# Patient Record
Sex: Female | Born: 1970 | Race: White | Hispanic: No | Marital: Married | Smoking: Former smoker
Health system: Southern US, Community
[De-identification: ages and names within clinical notes are randomized; demographics above are authoritative.]

## PROBLEM LIST (undated history)

## (undated) DIAGNOSIS — H9319 Tinnitus, unspecified ear: Secondary | ICD-10-CM

## (undated) DIAGNOSIS — R011 Cardiac murmur, unspecified: Secondary | ICD-10-CM

## (undated) DIAGNOSIS — D649 Anemia, unspecified: Secondary | ICD-10-CM

## (undated) DIAGNOSIS — N920 Excessive and frequent menstruation with regular cycle: Secondary | ICD-10-CM

## (undated) DIAGNOSIS — Z8051 Family history of malignant neoplasm of kidney: Secondary | ICD-10-CM

## (undated) DIAGNOSIS — N6001 Solitary cyst of right breast: Secondary | ICD-10-CM

## (undated) DIAGNOSIS — Z806 Family history of leukemia: Secondary | ICD-10-CM

## (undated) DIAGNOSIS — K2100 Gastro-esophageal reflux disease with esophagitis, without bleeding: Secondary | ICD-10-CM

## (undated) DIAGNOSIS — I10 Essential (primary) hypertension: Secondary | ICD-10-CM

## (undated) DIAGNOSIS — K449 Diaphragmatic hernia without obstruction or gangrene: Secondary | ICD-10-CM

## (undated) DIAGNOSIS — C801 Malignant (primary) neoplasm, unspecified: Secondary | ICD-10-CM

## (undated) DIAGNOSIS — K648 Other hemorrhoids: Secondary | ICD-10-CM

## (undated) DIAGNOSIS — K8020 Calculus of gallbladder without cholecystitis without obstruction: Secondary | ICD-10-CM

## (undated) DIAGNOSIS — Z800 Family history of malignant neoplasm of digestive organs: Secondary | ICD-10-CM

## (undated) DIAGNOSIS — K824 Cholesterolosis of gallbladder: Secondary | ICD-10-CM

## (undated) DIAGNOSIS — Z803 Family history of malignant neoplasm of breast: Secondary | ICD-10-CM

## (undated) DIAGNOSIS — K760 Fatty (change of) liver, not elsewhere classified: Secondary | ICD-10-CM

## (undated) DIAGNOSIS — K219 Gastro-esophageal reflux disease without esophagitis: Secondary | ICD-10-CM

## (undated) DIAGNOSIS — T7840XA Allergy, unspecified, initial encounter: Secondary | ICD-10-CM

## (undated) DIAGNOSIS — Z8041 Family history of malignant neoplasm of ovary: Secondary | ICD-10-CM

## (undated) HISTORY — DX: Family history of malignant neoplasm of kidney: Z80.51

## (undated) HISTORY — DX: Essential (primary) hypertension: I10

## (undated) HISTORY — DX: Gastro-esophageal reflux disease with esophagitis, without bleeding: K21.00

## (undated) HISTORY — DX: Solitary cyst of right breast: N60.01

## (undated) HISTORY — DX: Cardiac murmur, unspecified: R01.1

## (undated) HISTORY — DX: Diaphragmatic hernia without obstruction or gangrene: K44.9

## (undated) HISTORY — DX: Fatty (change of) liver, not elsewhere classified: K76.0

## (undated) HISTORY — DX: Calculus of gallbladder without cholecystitis without obstruction: K80.20

## (undated) HISTORY — DX: Family history of malignant neoplasm of ovary: Z80.41

## (undated) HISTORY — DX: Allergy, unspecified, initial encounter: T78.40XA

## (undated) HISTORY — DX: Other hemorrhoids: K64.8

## (undated) HISTORY — DX: Anemia, unspecified: D64.9

## (undated) HISTORY — DX: Gastro-esophageal reflux disease without esophagitis: K21.9

## (undated) HISTORY — PX: LEEP: SHX91

## (undated) HISTORY — DX: Excessive and frequent menstruation with regular cycle: N92.0

## (undated) HISTORY — DX: Family history of malignant neoplasm of breast: Z80.3

## (undated) HISTORY — DX: Cholesterolosis of gallbladder: K82.4

## (undated) HISTORY — DX: Tinnitus, unspecified ear: H93.19

## (undated) HISTORY — DX: Malignant (primary) neoplasm, unspecified: C80.1

## (undated) HISTORY — DX: Family history of malignant neoplasm of digestive organs: Z80.0

## (undated) HISTORY — DX: Family history of leukemia: Z80.6

## (undated) HISTORY — PX: REDUCTION MAMMAPLASTY: SUR839

---

## 2011-07-10 HISTORY — PX: COLONOSCOPY: SHX174

## 2013-03-27 HISTORY — PX: ESOPHAGOGASTRODUODENOSCOPY: SHX1529

## 2014-06-19 DIAGNOSIS — N6001 Solitary cyst of right breast: Secondary | ICD-10-CM

## 2014-06-19 HISTORY — DX: Solitary cyst of right breast: N60.01

## 2015-07-02 ENCOUNTER — Other Ambulatory Visit: Payer: Self-pay | Admitting: Family Medicine

## 2015-07-02 ENCOUNTER — Telehealth: Payer: Self-pay | Admitting: Family Medicine

## 2015-07-02 ENCOUNTER — Encounter: Payer: Self-pay | Admitting: Family Medicine

## 2015-07-02 ENCOUNTER — Ambulatory Visit (INDEPENDENT_AMBULATORY_CARE_PROVIDER_SITE_OTHER): Payer: 59 | Admitting: Family Medicine

## 2015-07-02 VITALS — BP 128/89 | HR 61 | Temp 98.4°F | Resp 20 | Ht 62.0 in | Wt 192.8 lb

## 2015-07-02 DIAGNOSIS — Z1239 Encounter for other screening for malignant neoplasm of breast: Secondary | ICD-10-CM | POA: Diagnosis not present

## 2015-07-02 DIAGNOSIS — Z7189 Other specified counseling: Secondary | ICD-10-CM | POA: Diagnosis not present

## 2015-07-02 DIAGNOSIS — E669 Obesity, unspecified: Secondary | ICD-10-CM | POA: Insufficient documentation

## 2015-07-02 DIAGNOSIS — K219 Gastro-esophageal reflux disease without esophagitis: Secondary | ICD-10-CM

## 2015-07-02 DIAGNOSIS — J302 Other seasonal allergic rhinitis: Secondary | ICD-10-CM

## 2015-07-02 DIAGNOSIS — Z7689 Persons encountering health services in other specified circumstances: Secondary | ICD-10-CM

## 2015-07-02 DIAGNOSIS — Z79899 Other long term (current) drug therapy: Secondary | ICD-10-CM | POA: Insufficient documentation

## 2015-07-02 DIAGNOSIS — M25552 Pain in left hip: Secondary | ICD-10-CM | POA: Insufficient documentation

## 2015-07-02 DIAGNOSIS — Z1231 Encounter for screening mammogram for malignant neoplasm of breast: Secondary | ICD-10-CM

## 2015-07-02 DIAGNOSIS — Z6835 Body mass index (BMI) 35.0-35.9, adult: Secondary | ICD-10-CM | POA: Diagnosis not present

## 2015-07-02 DIAGNOSIS — Z0000 Encounter for general adult medical examination without abnormal findings: Secondary | ICD-10-CM

## 2015-07-02 MED ORDER — FEXOFENADINE HCL 180 MG PO TABS
180.0000 mg | ORAL_TABLET | Freq: Every day | ORAL | Status: DC
Start: 2015-07-02 — End: 2016-09-16

## 2015-07-02 NOTE — Patient Instructions (Signed)
It was a pleasure meeting you today. We will go through your records and call you on when to schedule your yearly phyisical. We can complete your PAP at that time if indicated. I will also order your mammogram today.   I have called in [place] for you to try in place of zyrtec.

## 2015-07-02 NOTE — Telephone Encounter (Signed)
Please call patient: - After review of all of her records, it appeared that she is due for her yearly physical in August. We will complete her labs at that time as well. - Her mammogram was ordered at her office visit yesterday, and she should be receiving a call to set this up for her. - Below are the list of her due dates for her health maintenance.  Health Maintenance  Topic Date Due  . MAMMOGRAM  06/19/2015  . INFLUENZA VACCINE  09/25/2015  . COLONOSCOPY  07/09/2016  . PAP SMEAR  06/12/2017  . TETANUS/TDAP  06/13/2024  . HIV Screening  Completed

## 2015-07-02 NOTE — Progress Notes (Signed)
Patient ID: Rebecca Morrison, female   DOB: 1970-11-07, 45 y.o.   MRN: [id number]      Patient ID: Rebecca Morrison, female  DOB: 1970-09-17, 45 y.o.   MRN: [id number]  Subjective:  Rebecca Morrison is a 45 y.o. female present for establishment of care. She just moved from [place] 10/2014.  All past medical history, surgical history, allergies, family history, immunizations, medications and social history were obtained/entered in the electronic medical record today. All recent labs, ED visits and hospitalizations within the last year were reviewed. Patient has significant medical history of GERD, menorrhagia, iron deficiency anemia.  GERD: She is currently taking omeprazole, which she states occasionally makes her dizzy so she takes every other day. She has been tried on Carafate in the past, and feels that worked the best for her. She had also been on dexilant. She does admit she does not take her medications as indicated frequently.  BMI 35.0-35.9: Patient states she is struggling with her weight, she has been on phentermine in the past. She is not exercising routinely. She states she has a left hip pain that has been occurring over the last few weeks to months and it is hindering and exercise regimen. 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 She feels this is also hindering her exercise routine, she does not know many people to exercise with. She doesn't to do plenty of housework, and painting etc. which keeps her active but not cardiovascular active. She does not like to work out cardiovascularly. She does enjoy lifting weights. He has more fatigue. She does admit she has benign deficient anemia, and does not take iron supplementation because it makes her stomach upset.  Seasonal allergies: Patient states she was warned when she moved [place] should have more allergies. She is currently suffering from increased allergies and likely had a sinus infection a few weeks ago. She is  currently taking an antihistamine, however she does not feel her symptoms are improving.  Left hip joint pain: Patient states a few months ago she noticed a sharp pain in her left hip joint when walking. She says this is intermittent and she has been unable to identify certain movements it re-creates the problem. She's never had a hip injury. She reports she is worried to take certain steps as the pain is sharp and quick.   Health maintenance:  Colonoscopy: Family history of colon cancer, personal history of rectal bleeding. Colonoscopy completed May 2013. Follow-up at that time was encouraged in 5 years. Patient's colonoscopy was normal at that time. Mammogram: The patient has a family history of breast cancer. She has a personal history of cyst her right breast. Her last mammogram was completed April 2016. Cervical cancer screening: She has not found gynecologist. She had 1 abnormal pap, 1 leep that was normal. 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 Every 3 year follow up now.  Immunizations: Tetanus completed 06/14/2014, she has declined flu immunizations in the past. MRI completed in 2004. Infectious disease screening: HIV completed with last pregnancy. DEXA: History of osteoporosis in her mother, would favor early screening found to be vitamin D deficient or early menopause. Family has found a dentist. She has yet to make an appointment for herself.  Health Maintenance  Topic Date Due  . MAMMOGRAM  06/19/2015  . INFLUENZA VACCINE  09/25/2015  . COLONOSCOPY  07/09/2016  . PAP SMEAR  06/12/2017  . TETANUS/TDAP  06/13/2024  . HIV Screening  Completed  Immunization History  Administered Date(s) Administered  . MMR 09/29/2002  . Tdap 06/14/2014    Past Medical History  Diagnosis Date  . Allergy   . Anemia     pt states iron deficient  . GERD (gastroesophageal reflux disease)   . Hiatal hernia     3/1//2016 Dr. Gala Lewandowsky  . Tinnitus     ENT evaluated  . Menorrhagia   . Cyst of right  breast 06/19/2014    US/mamm, confirmed cyst 36m   No Known Allergies Past Surgical History  Procedure Laterality Date  . Colonoscopy  07/10/2011    rectal bleeding, FHX. Normal colonoscopy.   . Esophagogastroduodenoscopy  03/27/2013    hiatal hernia/GERD   Family History  Problem Relation Age of Onset  . Arthritis Mother   . Cancer Sister   . Kidney cancer Sister   . Colon cancer Maternal Grandmother   . Ovarian cancer Maternal Grandmother   . Bone cancer Maternal Grandfather   . Heart disease Maternal Grandfather   . Breast cancer Paternal Grandmother   . Osteoporosis Mother     "severe"   Social History   Social History  . Marital Status: Married    Spouse Name: N/A  . Number of Children: N/A  . Years of Education: N/A   Occupational History  . Not on file.   Social History Main Topics  . Smoking status: Former SResearch scientist (life sciences) . Smokeless tobacco: Never Used  . Alcohol Use: 3.0 - 3.6 oz/week    5-6 Glasses of wine per week  . Drug Use: No  . Sexual Activity: Yes    Birth Control/ Protection: Surgical     Comment: partner-vasectomy   Other Topics Concern  . 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   Former smoker, quit 2003.   Social alcohol use, no drug use.   Drinks caffeinated beverages, uses herbal remedies, takes a daily vitamin.   Wears her seatbelt, wears a bicycle helmet, exercises at least 3 times a week.   Smoke detector in the home, feels safe in her relationships.    ROS: Negative, with the exception of above mentioned in HPI  Objective: BP 128/89 mmHg  Pulse 61  Temp(Src) 98.4 F (36.9 C)  Resp 20  Ht _0  (1.575 m)  Wt 192 lb 12 oz (87.431 kg)  BMI 35.25 kg/m2  SpO2 98%  LMP 07/02/2015 Gen: Afebrile. No acute distress. Nontoxic in appearance, well-developed, well-nourished, female, Pleasant HENT: AT. [place].   MMM, no oral lesions Eyes:Pupils Equal Round Reactive to light, Extraocular movements intact,  Conjunctiva without redness, discharge or icterus. Neck/lymp/endocrine: Supple, no lymphadenopathy CV: RRR, 1/6 systolic murmur appreciated. No edema. Chest: CTAB, no wheeze, rhonchi or crackles.  Abd: Soft. Obese. NTND. BS present.  Skin: No rashes, purpura or petechiae. Warm and well-perfused. Skin intact. Neuro/Msk:  Normal gait. PERLA. EOMi. Alert. Oriented x3.  Cranial nerves II through XII intact. Muscle strength 5/5 upper and lower extremity. DTRs equal bilaterally. Psych: Normal affect, dress and demeanor. Normal speech. Normal thought content and judgment.   Assessment/plan: ELaasya Peytonis a 45y.o. female present for establishment of care, with multiple complaints. 1. Seasonal allergies - Discussed seasonal allergies with patient today, she was encouraged to change the medication she has chosen for antihistamine. Recommended Allegra, this is called in for her today. -She is to use  Flonase, and attempt Mucinex use to help with secretions. - fexofenadine (ALLEGRA ALLERGY) 180 MG tablet; Take 1 tablet (180 mg total) by mouth daily.  Dispense: 30 tablet; Refill: 5 - If not improving on the above regimen patient will need to follow-up to discuss in more detail and consider additional prescribed medications such as nasal sprays or Singulair.  2. Breast cancer screening - Order mammogram for patient today. Her mammogram studies are scanned into the electronic medical record from prior PCP/studies in [place].  3. BMI 35.0-35.9,adult - Patient was concerned about her weight gain. Discussed with her we would need to make an appointment to discuss this in more detail. I would not recommend medication regimens for weight loss. Encouraged her to increase her exercise greater than 150 minutes a week with cardiovascular workout, and monitor her caloric intake. - Patient is having some mild left pain that  seems to be preventing her from exercising more routinely. Discussed with her using anti-inflammatories and nonweightbearing exercises such as water aerobics. If pain is worsening or does not improve within encourage further workup.  4. Health care maintenance Health Maintenance  Topic Date Due  . MAMMOGRAM  06/19/2015  . INFLUENZA VACCINE  09/25/2015  . COLONOSCOPY  07/09/2016  . PAP SMEAR  06/12/2017  . TETANUS/TDAP  06/13/2024  . HIV Screening  Completed    5. Gastroesophageal reflux disease, esophagitis presence not specified - Patient to continue omeprazole. Consider that small daily cough could be from her reflux worsening. Although she seems to have other allergy symptoms. Will treat allergies first if no improvement in cough would change omeprazole.  6. Left hip pain - Patient encouraged to make an appointment to have this fully evaluated if NSAID use is not effective. There was no injury. Consider possible small labral tear.  CPE with labs August 2017  Greater than 45 minutes was spent with patient, greater than 50% of that time was spent face-to-face with patient counseling and coordinating care.  Electronically signed by: Howard Pouch, DO [place]

## 2015-07-03 NOTE — Telephone Encounter (Signed)
Spoke with patient reviewed information. Patient will call back to schedule CPE.

## 2015-07-26 ENCOUNTER — Ambulatory Visit: Payer: 59

## 2015-07-27 ENCOUNTER — Ambulatory Visit
Admission: RE | Admit: 2015-07-27 | Discharge: 2015-07-27 | Disposition: A | Discharge status: Home / Self Care | Payer: 59 | Source: Ambulatory Visit | Attending: Family Medicine | Admitting: Family Medicine

## 2015-07-27 DIAGNOSIS — Z1231 Encounter for screening mammogram for malignant neoplasm of breast: Secondary | ICD-10-CM

## 2015-09-03 ENCOUNTER — Ambulatory Visit: Payer: 59 | Admitting: Family Medicine

## 2015-10-25 ENCOUNTER — Ambulatory Visit: Payer: 59 | Admitting: Family Medicine

## 2015-10-30 ENCOUNTER — Encounter: Payer: Self-pay | Admitting: Family Medicine

## 2015-10-30 ENCOUNTER — Ambulatory Visit (INDEPENDENT_AMBULATORY_CARE_PROVIDER_SITE_OTHER): Payer: 59 | Admitting: Family Medicine

## 2015-10-30 ENCOUNTER — Telehealth: Payer: Self-pay | Admitting: Family Medicine

## 2015-10-30 VITALS — BP 118/83 | HR 63 | Temp 98.6°F | Resp 20 | Ht 62.0 in | Wt 197.0 lb

## 2015-10-30 DIAGNOSIS — F418 Other specified anxiety disorders: Secondary | ICD-10-CM | POA: Diagnosis not present

## 2015-10-30 DIAGNOSIS — Z6835 Body mass index (BMI) 35.0-35.9, adult: Secondary | ICD-10-CM

## 2015-10-30 DIAGNOSIS — R600 Localized edema: Secondary | ICD-10-CM | POA: Diagnosis not present

## 2015-10-30 LAB — COMPREHENSIVE METABOLIC PANEL
ALT: 28 U/L (ref 0–35)
AST: 22 U/L (ref 0–37)
Albumin: 4.1 g/dL (ref 3.5–5.2)
Alkaline Phosphatase: 65 U/L (ref 39–117)
BILIRUBIN TOTAL: 0.6 mg/dL (ref 0.2–1.2)
BUN: 7 mg/dL (ref 6–23)
CO2: 24 meq/L (ref 19–32)
CREATININE: 0.71 mg/dL (ref 0.40–1.20)
Calcium: 8.9 mg/dL (ref 8.4–10.5)
Chloride: 104 mEq/L (ref 96–112)
GFR: 94.6 mL/min (ref >60.00)
GLUCOSE: 88 mg/dL (ref 70–99)
Potassium: 4.2 mEq/L (ref 3.5–5.1)
Sodium: 136 mEq/L (ref 135–145)
Total Protein: 6.9 g/dL (ref 6.0–8.3)

## 2015-10-30 MED ORDER — VENLAFAXINE HCL ER 37.5 MG PO CP24: 37.5000 mg | ORAL_CAPSULE | Freq: Every day | ORAL | 0 refills | Status: DC

## 2015-10-30 MED ORDER — VENLAFAXINE HCL ER 75 MG PO CP24: 75.0000 mg | ORAL_CAPSULE | Freq: Every day | ORAL | 0 refills | Status: DC

## 2015-10-30 NOTE — Progress Notes (Signed)
Patient ID: Rebecca Morrison, female   DOB: 1970-08-24, 45 y.o.   MRN: [id number]      Patient ID: Rebecca Morrison, female  DOB: [birth year], 45 y.o.   MRN: [id number]  Subjective:  Rebecca Morrison is a 45 y.o. female present for acute complaints.   Anxiousness: Patient states her father passed away 04-29-2022. She has recently moved to the area within the last year and a half. She feels she has increased anxiety, she's had weight gain and she has noticed lower extremity edema on 2 occasions. She states that she had bilateral lower extremity after a weekend of increased alcohol consumption and a trip from [place]. She denies any chest pain, shortness of breath or daily lower extremity edema. She states the date that it occurred she had had a few martinis. She is wondering if she is increasing her alcohol consumption secondary to her increase in anxiousness. She reports she's not feeling dictated, but she is consuming alcohol daily, usually 1 beer with dinner.  Have you ever felt you needed to Cut down on your drinking? Yes, recently Have people Annoyed you by criticizing your drinking? no Have you ever felt Guilty about drinking? no Have you ever felt you needed a drink first thing in the morning (Eye-opener) to steady your nerves or to get rid of a hangover? no  BMI 35.0-35.9: Patient states she is struggling with her weight, she has been on phentermine in the past. She is not exercising at all, and admits to sedentary lifestyle.  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 She feels this is also hindering her exercise routine, she does not know many people to exercise with. She does not like to work out cardiovascularly. She does enjoy lifting weights.  Mood disorder screen: negative  Depression screen [facility] 2/9 10/30/2015 07/02/2015  Decreased Interest 1 0  Down, Depressed, Hopeless 2 0  PHQ - 2 Score 3 0  Altered sleeping 1 -  Tired, decreased energy 3 -  Change in appetite 3  -  Feeling bad or failure about yourself  2 -  Trouble concentrating 1 -  Moving slowly or fidgety/restless 1 -  Suicidal thoughts 0 -  PHQ-9 Score 14 -   GAD 7 : Generalized Anxiety Score 10/30/2015  Nervous, Anxious, on Edge 1  Control/stop worrying 3  Worry too much - different things 3  Trouble relaxing 3  Restless 0  Easily annoyed or irritable 1  Afraid - awful might happen 3  Total GAD 7 Score 14  Anxiety Difficulty Somewhat difficult    Health Maintenance  Topic Date Due  . INFLUENZA VACCINE  09/25/2015  . COLONOSCOPY  07/09/2016  . MAMMOGRAM  07/26/2016  . PAP SMEAR  06/12/2017  . TETANUS/TDAP  06/13/2024  . HIV Screening  Completed    Immunization History  Administered Date(s) Administered  . MMR 09/29/2002  . Tdap 06/14/2014    Past Medical History:  Diagnosis Date  . Allergy   . Anemia    pt states iron deficient  . Cyst of right breast 06/19/2014   US/mamm, confirmed cyst 59m  . GERD (gastroesophageal reflux disease)   . Hiatal hernia    3/1//2016 Dr. LGala Lewandowsky . Menorrhagia   . Murmur    Patient states echocardiogram was normal  . Tinnitus    ENT evaluated   No Known Allergies Past Surgical History:  Procedure Laterality Date  . COLONOSCOPY  07/10/2011   rectal bleeding, FHX. Normal colonoscopy.   .Marland Kitchen  ESOPHAGOGASTRODUODENOSCOPY  03/27/2013   hiatal hernia/GERD   Family History  Problem Relation Age of Onset  . Arthritis Mother   . Osteoporosis Mother     "severe"  . Cancer Sister   . Kidney cancer Sister   . Colon cancer Maternal Grandmother   . Ovarian cancer Maternal Grandmother   . Bone cancer Maternal Grandfather   . Heart disease Maternal Grandfather   . Breast cancer Paternal Grandmother    Social History   Social History  . Marital status: Married    Spouse name: N/A  . Number of children: N/A  . Years of education: N/A   Occupational History  . Not on file.   Social History Main Topics  . Smoking status: Former Research scientist (life sciences)    . Smokeless tobacco: Never Used  . Alcohol use 3.0 - 3.6 oz/week    5 - 6 Glasses of wine per week  . Drug use: No  . Sexual activity: Yes    Birth control/ protection: Surgical     Comment: partner-vasectomy   Other Topics Concern  . Not on file   Social History Narrative   Married, husband [name]. 2 children [name] in [place].   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   Former smoker, quit 2003.   Social alcohol use, no drug use.   Drinks caffeinated beverages, uses herbal remedies, takes a daily vitamin.   Wears her seatbelt, wears a bicycle helmet, exercises at least 3 times a week.   Smoke detector in the home, feels safe in her relationships.    ROS: Negative, with the exception of above mentioned in HPI  Objective: BP 118/83 (BP Location: Left Arm, Patient Position: Sitting, Cuff Size: Large)   Pulse 63   Temp 98.6 F (37 C)   Resp 20   Ht _0  (1.575 m)   Wt 197 lb (89.4 kg)   SpO2 96%   BMI 36.03 kg/m  Gen: Afebrile. No acute distress. Nontoxic in appearance, well-developed, well-nourished, female, Pleasant HENT: AT. [place].  MMM, no oral lesions Eyes:Pupils Equal Round Reactive to light, Extraocular movements intact,  Conjunctiva without redness, discharge or icterus. CV: RRR, 1/6 systolic murmur appreciated. No edema. Chest: CTAB, no wheeze, rhonchi or crackles.  Abd: Soft. Obese. NTND. BS present.  Skin: No rashes, purpura or petechiae. Warm and well-perfused. Skin intact. Neuro/Msk:  Normal gait. PERLA. EOMi. Alert. Oriented x3.   Psych: Anxious.. Normal speech. Normal thought content and judgment.   Assessment/plan: Rebecca Morrison is a 45 y.o. female present for establishment of care, with multiple complaints.  Bilateral lower extremity edema - This sounds like dependent edema, with flares when high sodium meals and long car rides occur. This is not a daily occurrence without any other systemic symptoms. Reassured  patient. - Comp Met (CMET): Rule out increased liver enzymes her decreased kidney function. - Discussed the benefits of exercise and weight loss. - Follow-up in 4 weeks  Depression with anxiety - Effexor 37.5 mg for 1 week, then taper to 75 mg daily.  - Patient follow-up in 4 weeks.  BMI 35.0-35.9,adult - Again, I would not recommend medication regimens for weight loss. Encouraged her to increase her exercise greater than 150 minutes a week with cardiovascular workout, and monitor her caloric intake. - PREP program exercise/nutrition program contact information provided to patient today.   Flu shot declined.   > 25 minutes spent with patient, >50% of time spent face to face counseling patient and coordinating  care.]  Electronically signed by: Howard Pouch, DO [place]

## 2015-10-30 NOTE — Telephone Encounter (Signed)
Please call patient: Her liver and kidney function are normal.

## 2015-10-30 NOTE — Patient Instructions (Signed)
Start effexor 37.7 mg for 1 week then increase to 75 mg dose.  Followup in 4 weeks.  Try PREP program   CMP to check liver/kidneys today. We will call you with results.

## 2015-10-31 MED ORDER — FLUTICASONE PROPIONATE 50 MCG/ACT NA SUSP: NASAL | 2 refills | Status: DC

## 2015-10-31 NOTE — Telephone Encounter (Signed)
Left message with results on patient voice mail 

## 2015-11-09 ENCOUNTER — Other Ambulatory Visit: Payer: Self-pay | Admitting: *Deleted

## 2015-11-09 MED ORDER — VENLAFAXINE HCL ER 75 MG PO CP24: 75.0000 mg | ORAL_CAPSULE | Freq: Every day | ORAL | 0 refills | Status: DC

## 2015-11-09 NOTE — Telephone Encounter (Signed)
Sent Rx for venlafaxine 75 mg to pharmacy patient doesn't know where printed script is.

## 2015-12-24 ENCOUNTER — Other Ambulatory Visit: Payer: Self-pay | Admitting: *Deleted

## 2015-12-24 MED ORDER — FLUTICASONE PROPIONATE 50 MCG/ACT NA SUSP: NASAL | 1 refills | Status: DC

## 2016-01-02 ENCOUNTER — Telehealth: Payer: Self-pay | Admitting: *Deleted

## 2016-01-02 MED ORDER — VENLAFAXINE HCL ER 75 MG PO CP24: 75.0000 mg | ORAL_CAPSULE | Freq: Every day | ORAL | 0 refills | Status: DC

## 2016-01-02 NOTE — Telephone Encounter (Signed)
Received refill request for venlafaxine. Patient was supposed to follow up in weeks after her appt  On 10/30/15. Left message for patient to call to schedule an appt for follow up. Sent a 30 day supply to pharmacy to allow patient time to schedule an appt.

## 2016-04-14 ENCOUNTER — Ambulatory Visit (INDEPENDENT_AMBULATORY_CARE_PROVIDER_SITE_OTHER): Payer: 59 | Admitting: Family Medicine

## 2016-04-14 ENCOUNTER — Encounter: Payer: Self-pay | Admitting: Family Medicine

## 2016-04-14 VITALS — BP 119/71 | HR 69 | Temp 98.4°F | Resp 20 | Wt 199.8 lb

## 2016-04-14 DIAGNOSIS — J0110 Acute frontal sinusitis, unspecified: Secondary | ICD-10-CM | POA: Diagnosis not present

## 2016-04-14 MED ORDER — AMOXICILLIN-POT CLAVULANATE 875-125 MG PO TABS: 1.0000 | ORAL_TABLET | Freq: Two times a day (BID) | ORAL | 0 refills | Status: DC

## 2016-04-14 NOTE — Progress Notes (Signed)
Evalena Clute , Jun 01, 1970, 46 y.o., female MRN: [id number] Patient Care Team    Relationship Specialty Notifications Start End  [name], DO PCP - General Family Medicine  09/03/15     CC: cough Subjective: Pt presents for an acute OV with complaints of cough, fatigue, headache, dizziness, sneezing of 2-3 week  duration.  She states she started omeprazole, but it is not better. She has been taking flonase daily. She has been outdoors, raking leaves and doing yardwork.  She denies fever, chills, nausea or vomit.   No Known Allergies Social History  Substance Use Topics  . Smoking status: Former Research scientist (life sciences)  . Smokeless tobacco: Never Used  . Alcohol use 3.0 - 3.6 oz/week    5 - 6 Glasses of wine per week   Past Medical History:  Diagnosis Date  . Allergy   . Anemia    pt states iron deficient  . Cyst of right breast 06/19/2014   US/mamm, confirmed cyst 34mm  . GERD (gastroesophageal reflux disease)   . Hiatal hernia    3/1//2016 Dr. Gala Lewandowsky  . Menorrhagia   . Murmur    Patient states echocardiogram was normal  . Tinnitus    ENT evaluated   Past Surgical History:  Procedure Laterality Date  . COLONOSCOPY  07/10/2011   rectal bleeding, FHX. Normal colonoscopy.   . ESOPHAGOGASTRODUODENOSCOPY  03/27/2013   hiatal hernia/GERD   Family History  Problem Relation Age of Onset  . Arthritis Mother   . Osteoporosis Mother     "severe"  . Cancer Sister   . Kidney cancer Sister   . Colon cancer Maternal Grandmother   . Ovarian cancer Maternal Grandmother   . Bone cancer Maternal Grandfather   . Heart disease Maternal Grandfather   . Breast cancer Paternal Grandmother    Allergies as of 04/14/2016   No Known Allergies     Medication List       Accurate as of 04/14/16  2:47 PM. Always use your most recent med list.          B-complex with vitamin C tablet Take 1 tablet by mouth daily.   BIOTIN 5000 5 MG Caps Generic drug:  Biotin Take 5,000 mg by mouth.     calcium-vitamin D 500-200 MG-UNIT tablet Take 1 tablet by mouth daily.   fexofenadine 180 MG tablet Commonly known as:  ALLEGRA ALLERGY Take 1 tablet (180 mg total) by mouth daily.   fluticasone 50 MCG/ACT nasal spray Commonly known as:  FLONASE IN THE NOSTRILS DAILY 2 SPRAYS IN EACH NOSTRIL DAILY   Ginkgo Biloba 40 MG Tabs Take 60 mg by mouth.   glucosamine-chondroitin 500-400 MG tablet Take 1 tablet by mouth 3 (three) times daily.   IRON-C PO Take 65 mg by mouth.   Melatonin 10 MG Tabs Take by mouth.   multivitamin with minerals tablet Take 1 tablet by mouth daily.   omeprazole 20 MG capsule Commonly known as:  PRILOSEC Take 20 mg by mouth daily.   venlafaxine XR 75 MG 24 hr capsule Commonly known as:  EFFEXOR XR Take 1 capsule (75 mg total) by mouth daily with breakfast. Needs office visit prior to anymore refills.       No results found for this or any previous visit (from the past 24 hour(s)). No results found.   ROS: Negative, with the exception of above mentioned in HPI   Objective:  BP 119/71 (BP Location: Right Arm, Patient Position: Sitting, Cuff Size:  Large)   Pulse 69   Temp 98.4 F (36.9 C)   Resp 20   Wt 199 lb 12 oz (90.6 kg)   SpO2 98%   BMI 36.53 kg/m  Body mass index is 36.53 kg/m. Gen: Afebrile. No acute distress. Nontoxic in appearance, well developed, well nourished.  HENT: AT. [place]. Bilateral TM visualized fullness. MMM, no oral lesions. Bilateral nares with erythema and drainage. Throat without erythema or exudates. Mild cough and TTP frontal sinus.  Eyes:Pupils Equal Round Reactive to light, Extraocular movements intact,  Conjunctiva without redness, discharge or icterus. Neck/lymp/endocrine: Supple,no lymphadenopathy CV: RRR Chest: CTAB, no wheeze or crackles. Good air movement, normal resp effort.  Abd: Soft. NTND. BS present Neuro:  Normal gait. PERLA. EOMi. Alert. Oriented x3   Assessment/Plan: Arabella Manocchio is a 46 y.o.  female present for acute OV for  1. Acute frontal sinusitis, recurrence not specified Rest, hydrate, nasal saline Restart flonase, antihistamine augmentin  F/U PRN   electronically signed by:  Howard Pouch, DO  [place]

## 2016-04-14 NOTE — Patient Instructions (Signed)
Mucinex DM daily Restart flonase and antihistamine.  Use nasal saline a couple times a day.  Augmentin prescribed for sinus infection.    Sinusitis, Adult Sinusitis is soreness and inflammation of your sinuses. Sinuses are hollow spaces in the bones around your face. They are located:  Around your eyes.  In the middle of your forehead.  Behind your nose.  In your cheekbones. Your sinuses and nasal passages are lined with a stringy fluid (mucus). Mucus normally drains out of your sinuses. When your nasal tissues get inflamed or swollen, the mucus can get trapped or blocked so air cannot flow through your sinuses. This lets bacteria, viruses, and funguses grow, and that leads to infection. Follow these instructions at home: Medicines  Take, use, or apply over-the-counter and prescription medicines only as told by your doctor. These may include nasal sprays.  If you were prescribed an antibiotic medicine, take it as told by your doctor. Do not stop taking the antibiotic even if you start to feel better. Hydrate and Humidify  Drink enough water to keep your pee (urine) clear or pale yellow.  Use a cool mist humidifier to keep the humidity level in your home above 50%.  Breathe in steam for 10-15 minutes, 3-4 times a day or as told by your doctor. You can do this in the bathroom while a hot shower is running.  Try not to spend time in cool or dry air. Rest  Rest as much as possible.  Sleep with your head raised (elevated).  Make sure to get enough sleep each night. General instructions  Put a warm, moist washcloth on your face 3-4 times a day or as told by your doctor. This will help with discomfort.  Wash your hands often with soap and water. If there is no soap and water, use hand sanitizer.  Do not smoke. Avoid being around people who are smoking (secondhand smoke).  Keep all follow-up visits as told by your doctor. This is important. Contact a doctor if:  You have a  fever.  Your symptoms get worse.  Your symptoms do not get better within 10 days. Get help right away if:  You have a very bad headache.  You cannot stop throwing up (vomiting).  You have pain or swelling around your face or eyes.  You have trouble seeing.  You feel confused.  Your neck is stiff.  You have trouble breathing. This information is not intended to replace advice given to you by your health care provider. Make sure you discuss any questions you have with your health care provider. Document Released: 07/30/2007 Document Revised: 10/07/2015 Document Reviewed: 12/06/2014 Elsevier Interactive Patient Education  2017 [employer].

## 2016-04-25 ENCOUNTER — Telehealth: Payer: Self-pay | Admitting: Family Medicine

## 2016-04-25 MED ORDER — BENZONATATE 200 MG PO CAPS: 200.0000 mg | ORAL_CAPSULE | Freq: Two times a day (BID) | ORAL | 0 refills | Status: DC | PRN

## 2016-04-25 NOTE — Telephone Encounter (Signed)
I called in Rebecca Morrison. She could use mucinex DM or OTC cough syrup as well.

## 2016-04-25 NOTE — Telephone Encounter (Signed)
Patient still has a cough and has taken the amoxicillin-clavulanate (AUGMENTIN) 875-125 MG tablet. Is there anything else that Dr. Raoul Pitch could prescribe to help patient's cough?  Please call patient to advise.

## 2016-04-25 NOTE — Telephone Encounter (Signed)
Spoke with patient reviewed information and instructions. 

## 2016-04-25 NOTE — Addendum Note (Signed)
Addended by: Howard Pouch A on: 04/25/2016 04:54 PM   Modules accepted: Orders

## 2016-07-07 ENCOUNTER — Ambulatory Visit (INDEPENDENT_AMBULATORY_CARE_PROVIDER_SITE_OTHER): Payer: 59 | Admitting: Family Medicine

## 2016-07-07 ENCOUNTER — Encounter: Payer: Self-pay | Admitting: Family Medicine

## 2016-07-07 VITALS — BP 133/86 | HR 69 | Temp 98.2°F | Resp 16 | Ht 62.0 in | Wt 187.8 lb

## 2016-07-07 DIAGNOSIS — Z1231 Encounter for screening mammogram for malignant neoplasm of breast: Secondary | ICD-10-CM | POA: Diagnosis not present

## 2016-07-07 DIAGNOSIS — Z79899 Other long term (current) drug therapy: Secondary | ICD-10-CM

## 2016-07-07 DIAGNOSIS — Z1211 Encounter for screening for malignant neoplasm of colon: Secondary | ICD-10-CM | POA: Diagnosis not present

## 2016-07-07 DIAGNOSIS — Z0000 Encounter for general adult medical examination without abnormal findings: Secondary | ICD-10-CM

## 2016-07-07 DIAGNOSIS — Z1239 Encounter for other screening for malignant neoplasm of breast: Secondary | ICD-10-CM

## 2016-07-07 DIAGNOSIS — K219 Gastro-esophageal reflux disease without esophagitis: Secondary | ICD-10-CM

## 2016-07-07 DIAGNOSIS — Z6834 Body mass index (BMI) 34.0-34.9, adult: Secondary | ICD-10-CM | POA: Diagnosis not present

## 2016-07-07 LAB — CBC WITH DIFFERENTIAL/PLATELET
BASOS ABS: 0.1 10*3/uL (ref 0.0–0.1)
Basophils Relative: 1.4 % (ref 0.0–3.0)
EOS ABS: 0.3 10*3/uL (ref 0.0–0.7)
Eosinophils Relative: 7.7 % — ABNORMAL HIGH (ref 0.0–5.0)
HCT: 39.1 % (ref 36.0–46.0)
Hemoglobin: 12.9 g/dL (ref 12.0–15.0)
LYMPHS ABS: 1.1 10*3/uL (ref 0.7–4.0)
Lymphocytes Relative: 29.3 % (ref 12.0–46.0)
MCHC: 33 g/dL (ref 30.0–36.0)
MCV: 88.9 fl (ref 78.0–100.0)
Monocytes Absolute: 0.2 10*3/uL (ref 0.1–1.0)
Monocytes Relative: 6.9 % (ref 3.0–12.0)
NEUTROS ABS: 2 10*3/uL (ref 1.4–7.7)
NEUTROS PCT: 54.7 % (ref 43.0–77.0)
PLATELETS: 180 10*3/uL (ref 150.0–400.0)
RBC: 4.4 Mil/uL (ref 3.87–5.11)
RDW: 15.3 % (ref 11.5–15.5)
WBC: 3.6 10*3/uL — ABNORMAL LOW (ref 4.0–10.5)

## 2016-07-07 LAB — LIPID PANEL
CHOLESTEROL: 162 mg/dL (ref 0–200)
HDL: 48.7 mg/dL (ref >39.00)
LDL Cholesterol: 98 mg/dL (ref 0–99)
NonHDL: 113.07
TRIGLYCERIDES: 76 mg/dL (ref 0.0–149.0)
Total CHOL/HDL Ratio: 3
VLDL: 15.2 mg/dL (ref 0.0–40.0)

## 2016-07-07 LAB — TSH: TSH: 0.8 u[IU]/mL (ref 0.35–4.50)

## 2016-07-07 MED ORDER — OMEPRAZOLE 20 MG PO CPDR: 20.0000 mg | DELAYED_RELEASE_CAPSULE | Freq: Every day | ORAL | 3 refills | Status: DC

## 2016-07-07 NOTE — Progress Notes (Addendum)
Patient ID: Rebecca Morrison, female  DOB: [birth year], 46 y.o.   MRN: [id number] Patient Care Team    Relationship Specialty Notifications Start End  [name], DO PCP - General Family Medicine  09/03/15     Chief Complaint  Patient presents with  . Annual Exam    Subjective:  Rebecca Morrison is a 46 y.o.  Female  present for CPE. All past medical history, surgical history, allergies, family history, immunizations, medications and social history were updated in the electronic medical record today. All recent labs, ED visits and hospitalizations within the last year were reviewed.  Health maintenance: reviewed 07/07/2016 Colonoscopy: Family history of colon cancer, personal history of rectal bleeding. Colonoscopy completed May 2013. Follow-up at that time was encouraged in 5 years. Patient's colonoscopy was normal at that time. Referral placed today. Mammogram: The patient has a family history of breast cancer. She has a personal history of cyst her right breast. Her last mammogram was completed 07/27/2015. Cervical cancer screening: She has not found gynecologist. She had 1 abnormal pap, 1 leep that was normal. 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 Every 3 year follow up now. Due 2019.  Immunizations: Tetanus completed 06/14/2014, she has declined flu immunizations in the past.  Infectious disease screening: HIV completed with last pregnancy. DEXA: History of osteoporosis in her mother Has family dentist.  No hospitalization.   Depression screen [facility] 2/9 07/07/2016 10/30/2015 07/02/2015  Decreased Interest 0 1 0  Down, Depressed, Hopeless 0 2 0  PHQ - 2 Score 0 3 0  Altered sleeping - 1 -  Tired, decreased energy - 3 -  Change in appetite - 3 -  Feeling bad or failure about yourself  - 2 -  Trouble concentrating - 1 -  Moving slowly or fidgety/restless - 1 -  Suicidal thoughts - 0 -  PHQ-9 Score - 14 -   GAD 7 : Generalized Anxiety Score 10/30/2015  Nervous, Anxious, on Edge 1  Control/stop  worrying 3  Worry too much - different things 3  Trouble relaxing 3  Restless 0  Easily annoyed or irritable 1  Afraid - awful might happen 3  Total GAD 7 Score 14  Anxiety Difficulty Somewhat difficult    Immunization History  Administered Date(s) Administered  . MMR 09/29/2002  . Tdap 06/14/2014   Fall Risk  07/07/2016  Falls in the past year? No   Current Exercise Habits: Home exercise routine;Structured exercise class, Frequency (Times/Week): 3, Intensity: Moderate   Functional Status Survey: Is the patient deaf or have difficulty hearing?: No Does the patient have difficulty seeing, even when wearing glasses/contacts?: No Does the patient have difficulty concentrating, remembering, or making decisions?: No Does the patient have difficulty walking or climbing stairs?: No Does the patient have difficulty dressing or bathing?: No Does the patient have difficulty doing errands alone such as visiting a doctor's office or shopping?: No   Past Medical History:  Diagnosis Date  . Allergy   . Anemia    pt states iron deficient  . Cyst of right breast 06/19/2014   US/mamm, confirmed cyst 27m  . GERD (gastroesophageal reflux disease)   . Hiatal hernia    3/1//2016 Dr. LGala Lewandowsky . Menorrhagia   . Murmur    Patient states echocardiogram was normal  . Tinnitus    ENT evaluated   No Known Allergies Past Surgical History:  Procedure Laterality Date  . COLONOSCOPY  07/10/2011   rectal bleeding, FHX. Normal colonoscopy.   .Marland Kitchen  ESOPHAGOGASTRODUODENOSCOPY  03/27/2013   hiatal hernia/GERD   Family History  Problem Relation Age of Onset  . Arthritis Mother   . Osteoporosis Mother        "severe"  . Cancer Sister   . Kidney cancer Sister   . Colon cancer Maternal Grandmother   . Ovarian cancer Maternal Grandmother   . Bone cancer Maternal Grandfather   . Heart disease Maternal Grandfather   . Breast cancer Paternal Grandmother    Social History   Social History  . Marital  status: Married    Spouse name: N/A  . Number of children: N/A  . Years of education: N/A   Occupational History  . Not on file.   Social History Main Topics  . Smoking status: Former Games developer  . Smokeless tobacco: Never Used  . Alcohol use 3.0 - 3.6 oz/week    5 - 6 Glasses of wine per week  . Drug use: No  . Sexual activity: Yes    Birth control/ protection: Surgical     Comment: partner-vasectomy   Other Topics Concern  . Not on file   Social History Narrative   Married, husband Trey Paula. 2 children Joselyn Glassman in [place].   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   Former smoker, quit 2003.   Social alcohol use, no drug use.   Drinks caffeinated beverages, uses herbal remedies, takes a daily vitamin.   Wears her seatbelt, wears a bicycle helmet, exercises at least 3 times a week.   Smoke detector in the home, feels safe in her relationships.   Allergies as of 07/07/2016   No Known Allergies     Medication List       Accurate as of 07/07/16  9:10 AM. Always use your most recent med list.          B-complex with vitamin C tablet Take 1 tablet by mouth daily.   BIOTIN 5000 5 MG Caps Generic drug:  Biotin Take 5,000 mg by mouth.   calcium-vitamin D 500-200 MG-UNIT tablet Take 1 tablet by mouth daily.   fexofenadine 180 MG tablet Commonly known as:  ALLEGRA ALLERGY Take 1 tablet (180 mg total) by mouth daily.   fluticasone 50 MCG/ACT nasal spray Commonly known as:  FLONASE IN THE NOSTRILS DAILY 2 SPRAYS IN EACH NOSTRIL DAILY   Ginkgo Biloba 40 MG Tabs Take 60 mg by mouth.   glucosamine-chondroitin 500-400 MG tablet Take 1 tablet by mouth 3 (three) times daily.   IRON-C PO Take 65 mg by mouth.   Melatonin 10 MG Tabs Take by mouth.   multivitamin with minerals tablet Take 1 tablet by mouth daily.   omeprazole 20 MG capsule Commonly known as:  PRILOSEC Take 1 capsule (20 mg total) by mouth daily.       All  past medical history, surgical history, allergies, family history, immunizations andmedications were updated in the EMR today and reviewed under the history and medication portions of their EMR.     No results found for this or any previous visit (from the past 2160 hour(s)).  Mm Screening Breast Tomo Bilateral  Result Date: 07/27/2015 CLINICAL DATA:  Screening. EXAM: 2D DIGITAL SCREENING BILATERAL MAMMOGRAM WITH CAD AND ADJUNCT TOMO COMPARISON:  None. ACR Breast Density Category b: There are scattered areas of fibroglandular density. FINDINGS: There are no findings suspicious for malignancy. Images were processed with CAD. IMPRESSION: No mammographic evidence of malignancy. A result letter of this screening mammogram will be mailed  directly to the patient. RECOMMENDATION: Screening mammogram in one year. (Code:SM-B-01Y) BI-RADS CATEGORY  1: Negative. Electronically Signed   By: Everlean Alstrom M.D.   On: 07/30/2015 12:26     ROS: 14 pt review of systems performed and negative (unless mentioned in an HPI)  Objective: BP 133/86 (BP Location: Right Arm, Patient Position: Sitting, Cuff Size: Normal)   Pulse 69   Temp 98.2 F (36.8 C) (Oral)   Resp 16   Ht '5\' 2"'$  (1.575 m)   Wt 187 lb 12 oz (85.2 kg)   LMP 07/05/2016 (Exact Date)   SpO2 99%   BMI 34.34 kg/m  Gen: Afebrile. No acute distress. Nontoxic in appearance, well-developed, well-nourished,  Pleasant caucasian female.  HENT: AT. [place]. Bilateral TM visualized and normal in appearance, normal external auditory canal. MMM, no oral lesions, adequate dentition. Bilateral nares within normal limits. Throat without erythema, ulcerations or exudates. no Cough on exam, no hoarseness on exam. Eyes:Pupils Equal Round Reactive to light, Extraocular movements intact,  Conjunctiva without redness, discharge or icterus. Neck/lymp/endocrine: Supple,no lymphadenopathy, no thyromegaly CV: RRR 2/6 SM, no edema, +2/4 P posterior tibialis pulses. no carotid  bruits. No JVD. Chest: CTAB, no wheeze, rhonchi or crackles. normal Respiratory effort. good Air movement. Abd: Soft. obese. NTND. BS present. no Masses palpated. No hepatosplenomegaly. No rebound tenderness or guarding. Skin: no rashes, purpura or petechiae. Warm and well-perfused. Skin intact. Neuro/Msk:  Normal gait. PERLA. EOMi. Alert. Oriented x3.  Cranial nerves II through XII intact. Muscle strength 5/5 upper/lower extremity. DTRs equal bilaterally. Psych: Normal affect, dress and demeanor. Normal speech. Normal thought content and judgment.  No exam data present  Assessment/plan: Rebecca Morrison is a 46 y.o. female present for CPE. Gastroesophageal reflux disease, esophagitis presence not specified PPI PRN. Improved with weight loss and dietary changes  Breast cancer screening - FHX present, yearly mammogram - MM Digital Screening; Future BMI 34.0-34.9,adult Has lost 12 lbs, doing well.  - CBC w/Diff - BASIC METABOLIC PANEL WITH GFR - TSH - Hemoglobin A1c - Lipid panel Encounter for long-term (current) use of medications - PPI use, multiple supplements OTC - CBC w/Diff - BASIC METABOLIC PANEL WITH GFR - TSH - Hemoglobin A1c - Lipid panel Colon cancer screening - FHx present. 5 year f/u due. Referral placed today.  - Ambulatory referral to Gastroenterology Encounter for preventive health examination - Mammogram, colonoscopy ordered. - UTD immunizations and screenings.  - PHQ 2 normal.  Patient was encouraged to exercise greater than 150 minutes a week. Patient was encouraged to choose a diet filled with fresh fruits and vegetables, and lean meats. AVS provided to patient today for education/recommendation on gender specific health and safety maintenance. Return in about 1 year (around 07/07/2017) for CPE.  Electronically signed by: Howard Pouch, DO [place]

## 2016-07-07 NOTE — Patient Instructions (Signed)
Health Maintenance, Female Adopting a healthy lifestyle and getting preventive care can go a long way to promote health and wellness. Talk with your health care provider about what schedule of regular examinations is right for you. This is a good chance for you to check in with your provider about disease prevention and staying healthy. In between checkups, there are plenty of things you can do on your own. Experts have done a lot of research about which lifestyle changes and preventive measures are most likely to keep you healthy. Ask your health care provider for more information. Weight and diet Eat a healthy diet  Be sure to include plenty of vegetables, fruits, low-fat dairy products, and lean protein.  Do not eat a lot of foods high in solid fats, added sugars, or salt.  Get regular exercise. This is one of the most important things you can do for your health.  Most adults should exercise for at least 150 minutes each week. The exercise should increase your heart rate and make you sweat (moderate-intensity exercise).  Most adults should also do strengthening exercises at least twice a week. This is in addition to the moderate-intensity exercise. Maintain a healthy weight  Body mass index (BMI) is a measurement that can be used to identify possible weight problems. It estimates body fat based on height and weight. Your health care provider can help determine your BMI and help you achieve or maintain a healthy weight.  For females 46 years of age and older:  A BMI below 18.5 is considered underweight.  A BMI of 18.5 to 24.9 is normal.  A BMI of 25 to 29.9 is considered overweight.  A BMI of 30 and above is considered obese. Watch levels of cholesterol and blood lipids  You should start having your blood tested for lipids and cholesterol at 46 years of age, then have this test every 5 years.  You may need to have your cholesterol levels checked more often if:  Your lipid or  cholesterol levels are high.  You are older than 46 years of age.  You are at high risk for heart disease. Cancer screening Lung Cancer  Lung cancer screening is recommended for adults 64-42 years old who are at high risk for lung cancer because of a history of smoking.  A yearly low-dose CT scan of the lungs is recommended for people who:  Currently smoke.  Have quit within the past 15 years.  Have at least a 30-pack-year history of smoking. A pack year is smoking an average of one pack of cigarettes a day for 1 year.  Yearly screening should continue until it has been 15 years since you quit.  Yearly screening should stop if you develop a health problem that would prevent you from having lung cancer treatment. Breast Cancer  Practice breast self-awareness. This means understanding how your breasts normally appear and feel.  It also means doing regular breast self-exams. Let your health care provider know about any changes, no matter how small.  If you are in your 20s or 30s, you should have a clinical breast exam (CBE) by a health care provider every 1-3 years as part of a regular health exam.  If you are 34 or older, have a CBE every year. Also consider having a breast X-ray (mammogram) every year.  If you have a family history of breast cancer, talk to your health care provider about genetic screening.  If you are at high risk for breast cancer, talk  to your health care provider about having an MRI and a mammogram every year.  Breast cancer gene (BRCA) assessment is recommended for women who have family members with BRCA-related cancers. BRCA-related cancers include:  Breast.  Ovarian.  Tubal.  Peritoneal cancers.  Results of the assessment will determine the need for genetic counseling and BRCA1 and BRCA2 testing. Cervical Cancer  Your health care provider may recommend that you be screened regularly for cancer of the pelvic organs (ovaries, uterus, and vagina).  This screening involves a pelvic examination, including checking for microscopic changes to the surface of your cervix (Pap test). You may be encouraged to have this screening done every 3 years, beginning at age 24.  For women ages 66-65, health care providers may recommend pelvic exams and Pap testing every 3 years, or they may recommend the Pap and pelvic exam, combined with testing for human papilloma virus (HPV), every 5 years. Some types of HPV increase your risk of cervical cancer. Testing for HPV may also be done on women of any age with unclear Pap test results.  Other health care providers may not recommend any screening for nonpregnant women who are considered low risk for pelvic cancer and who do not have symptoms. Ask your health care provider if a screening pelvic exam is right for you.  If you have had past treatment for cervical cancer or a condition that could lead to cancer, you need Pap tests and screening for cancer for at least 20 years after your treatment. If Pap tests have been discontinued, your risk factors (such as having a new sexual partner) need to be reassessed to determine if screening should resume. Some women have medical problems that increase the chance of getting cervical cancer. In these cases, your health care provider may recommend more frequent screening and Pap tests. Colorectal Cancer  This type of cancer can be detected and often prevented.  Routine colorectal cancer screening usually begins at 46 years of age and continues through 46 years of age.  Your health care provider may recommend screening at an earlier age if you have risk factors for colon cancer.  Your health care provider may also recommend using home test kits to check for hidden blood in the stool.  A small camera at the end of a tube can be used to examine your colon directly (sigmoidoscopy or colonoscopy). This is done to check for the earliest forms of colorectal cancer.  Routine  screening usually begins at age 41.  Direct examination of the colon should be repeated every 5-10 years through 46 years of age. However, you may need to be screened more often if early forms of precancerous polyps or small growths are found. Skin Cancer  Check your skin from head to toe regularly.  Tell your health care provider about any new moles or changes in moles, especially if there is a change in a mole's shape or color.  Also tell your health care provider if you have a mole that is larger than the size of a pencil eraser.  Always use sunscreen. Apply sunscreen liberally and repeatedly throughout the day.  Protect yourself by wearing long sleeves, pants, a wide-brimmed hat, and sunglasses whenever you are outside. Heart disease, diabetes, and high blood pressure  High blood pressure causes heart disease and increases the risk of stroke. High blood pressure is more likely to develop in:  People who have blood pressure in the high end of the normal range (130-139/85-89 mm Hg).  People who are overweight or obese.  People who are African American.  If you are 59-24 years of age, have your blood pressure checked every 3-5 years. If you are 34 years of age or older, have your blood pressure checked every year. You should have your blood pressure measured twice-once when you are at a hospital or clinic, and once when you are not at a hospital or clinic. Record the average of the two measurements. To check your blood pressure when you are not at a hospital or clinic, you can use:  An automated blood pressure machine at a pharmacy.  A home blood pressure monitor.  If you are between 29 years and 60 years old, ask your health care provider if you should take aspirin to prevent strokes.  Have regular diabetes screenings. This involves taking a blood sample to check your fasting blood sugar level.  If you are at a normal weight and have a low risk for diabetes, have this test once  every three years after 46 years of age.  If you are overweight and have a high risk for diabetes, consider being tested at a younger age or more often. Preventing infection Hepatitis B  If you have a higher risk for hepatitis B, you should be screened for this virus. You are considered at high risk for hepatitis B if:  You were born in a country where hepatitis B is common. Ask your health care provider which countries are considered high risk.  Your parents were born in a high-risk country, and you have not been immunized against hepatitis B (hepatitis B vaccine).  You have HIV or AIDS.  You use needles to inject street drugs.  You live with someone who has hepatitis B.  You have had sex with someone who has hepatitis B.  You get hemodialysis treatment.  You take certain medicines for conditions, including cancer, organ transplantation, and autoimmune conditions. Hepatitis C  Blood testing is recommended for:  Everyone born from [birth year] through 1965.  Anyone with known risk factors for hepatitis C. Sexually transmitted infections (STIs)  You should be screened for sexually transmitted infections (STIs) including gonorrhea and chlamydia if:  You are sexually active and are younger than 46 years of age.  You are older than 46 years of age and your health care provider tells you that you are at risk for this type of infection.  Your sexual activity has changed since you were last screened and you are at an increased risk for chlamydia or gonorrhea. Ask your health care provider if you are at risk.  If you do not have HIV, but are at risk, it may be recommended that you take a prescription medicine daily to prevent HIV infection. This is called pre-exposure prophylaxis (PrEP). You are considered at risk if:  You are sexually active and do not regularly use condoms or know the HIV status of your partner(s).  You take drugs by injection.  You are sexually active with a partner  who has HIV. Talk with your health care provider about whether you are at high risk of being infected with HIV. If you choose to begin PrEP, you should first be tested for HIV. You should then be tested every 3 months for as long as you are taking PrEP. Pregnancy  If you are premenopausal and you may become pregnant, ask your health care provider about preconception counseling.  If you may become pregnant, take 400 to 800 micrograms (mcg) of folic acid  every day.  If you want to prevent pregnancy, talk to your health care provider about birth control (contraception). Osteoporosis and menopause  Osteoporosis is a disease in which the bones lose minerals and strength with aging. This can result in serious bone fractures. Your risk for osteoporosis can be identified using a bone density scan.  If you are 4 years of age or older, or if you are at risk for osteoporosis and fractures, ask your health care provider if you should be screened.  Ask your health care provider whether you should take a calcium or vitamin D supplement to lower your risk for osteoporosis.  Menopause may have certain physical symptoms and risks.  Hormone replacement therapy may reduce some of these symptoms and risks. Talk to your health care provider about whether hormone replacement therapy is right for you. Follow these instructions at home:  Schedule regular health, dental, and eye exams.  Stay current with your immunizations.  Do not use any tobacco products including cigarettes, chewing tobacco, or electronic cigarettes.  If you are pregnant, do not drink alcohol.  If you are breastfeeding, limit how much and how often you drink alcohol.  Limit alcohol intake to no more than 1 drink per day for nonpregnant women. One drink equals 12 ounces of beer, 5 ounces of wine, or 1 ounces of hard liquor.  Do not use street drugs.  Do not share needles.  Ask your health care provider for help if you need support  or information about quitting drugs.  Tell your health care provider if you often feel depressed.  Tell your health care provider if you have ever been abused or do not feel safe at home. This information is not intended to replace advice given to you by your health care provider. Make sure you discuss any questions you have with your health care provider. Document Released: 08/26/2010 Document Revised: 07/19/2015 Document Reviewed: 11/14/2014 Elsevier Interactive Patient Education  2017 [employer].

## 2016-07-08 ENCOUNTER — Telehealth: Payer: Self-pay | Admitting: Family Medicine

## 2016-07-08 LAB — BASIC METABOLIC PANEL WITH GFR
BUN: 8 mg/dL (ref 7–25)
CALCIUM: 8.8 mg/dL (ref 8.6–10.2)
CO2: 18 mmol/L — ABNORMAL LOW (ref 20–31)
CREATININE: 0.71 mg/dL (ref 0.50–1.10)
Chloride: 106 mmol/L (ref 98–110)
GFR, Est African American: >89 mL/min (ref >=60)
GFR, Est Non African American: >89 mL/min (ref >=60)
Glucose, Bld: 81 mg/dL (ref 65–99)
POTASSIUM: 4.3 mmol/L (ref 3.5–5.3)
Sodium: 139 mmol/L (ref 135–146)

## 2016-07-08 LAB — HEMOGLOBIN A1C
Hgb A1c MFr Bld: 4.7 % (ref <5.7)
MEAN PLASMA GLUCOSE: 88 mg/dL

## 2016-07-08 NOTE — Telephone Encounter (Signed)
Patient notified and verbalized understanding. 

## 2016-07-08 NOTE — Telephone Encounter (Signed)
Please call pt: - her labs look great! Everything normal.

## 2016-07-31 ENCOUNTER — Ambulatory Visit
Admission: RE | Admit: 2016-07-31 | Discharge: 2016-07-31 | Disposition: A | Discharge status: Home / Self Care | Payer: 59 | Source: Ambulatory Visit | Attending: Family Medicine | Admitting: Family Medicine

## 2016-07-31 DIAGNOSIS — Z1239 Encounter for other screening for malignant neoplasm of breast: Secondary | ICD-10-CM

## 2016-07-31 DIAGNOSIS — Z1231 Encounter for screening mammogram for malignant neoplasm of breast: Secondary | ICD-10-CM | POA: Diagnosis not present

## 2016-08-07 ENCOUNTER — Encounter: Payer: Self-pay | Admitting: Family Medicine

## 2016-09-15 ENCOUNTER — Encounter: Payer: Self-pay | Admitting: Gastroenterology

## 2016-09-16 ENCOUNTER — Encounter: Payer: Self-pay | Admitting: Family Medicine

## 2016-09-16 ENCOUNTER — Ambulatory Visit (INDEPENDENT_AMBULATORY_CARE_PROVIDER_SITE_OTHER): Payer: 59 | Admitting: Family Medicine

## 2016-09-16 VITALS — BP 130/89 | HR 62 | Temp 98.2°F | Resp 20 | Wt 189.5 lb

## 2016-09-16 DIAGNOSIS — K219 Gastro-esophageal reflux disease without esophagitis: Secondary | ICD-10-CM | POA: Diagnosis not present

## 2016-09-16 DIAGNOSIS — R109 Unspecified abdominal pain: Secondary | ICD-10-CM | POA: Diagnosis not present

## 2016-09-16 DIAGNOSIS — M545 Low back pain, unspecified: Secondary | ICD-10-CM

## 2016-09-16 DIAGNOSIS — R1011 Right upper quadrant pain: Secondary | ICD-10-CM

## 2016-09-16 LAB — COMPLETE METABOLIC PANEL WITH GFR
ALK PHOS: 59 U/L (ref 33–115)
ALT: 25 U/L (ref 6–29)
AST: 17 U/L (ref 10–35)
Albumin: 4.1 g/dL (ref 3.6–5.1)
BILIRUBIN TOTAL: 0.5 mg/dL (ref 0.2–1.2)
BUN: 8 mg/dL (ref 7–25)
CO2: 21 mmol/L (ref 20–31)
CREATININE: 0.73 mg/dL (ref 0.50–1.10)
Calcium: 9.1 mg/dL (ref 8.6–10.2)
Chloride: 104 mmol/L (ref 98–110)
GFR, Est African American: >89 mL/min (ref >=60)
Glucose, Bld: 83 mg/dL (ref 65–99)
POTASSIUM: 4.4 mmol/L (ref 3.5–5.3)
SODIUM: 137 mmol/L (ref 135–146)
Total Protein: 7.1 g/dL (ref 6.1–8.1)

## 2016-09-16 NOTE — Progress Notes (Signed)
Rebecca Morrison , 01/14/1971, 46 y.o., female MRN: [id number] Patient Care Team    Relationship Specialty Notifications Start End  [name], DO PCP - General Family Medicine  09/03/15     Chief Complaint  Patient presents with  . Abdominal Pain    RUQ,bloating,constipation     Subjective: Pt presents for an OV with complaints of  RUQ pain of A few months duration. She has a history of small hiatal hernia discovered on EGD 10 years ago. She has a history of reflux and which she is prescribed omeprazole, she admits she only takes intermittently.  Associated symptoms include sour taste in her mouth, right flank pain, mild low back pain. She feels more bloated and had been constipated before reintroducing carbs. She lost 15 lbs and now restarting carbs. Symptoms started after reintroducing carbs 6 weeks ago. Pt has tried nothing to ease their symptoms. She denies increase in heartburn, belching, fever, chills or unintentional weight loss. She had a colonoscopy 5 years ago secondary to rectal bleeding with a 5 year recall secondary to family history of multiple different types of cancer, including: Colon, Kidney, ovarian, breast and bone. Cervical cancer screening reported in 2016, no records available.  Depression screen [facility] 2/9 07/07/2016 10/30/2015 07/02/2015  Decreased Interest 0 1 0  Down, Depressed, Hopeless 0 2 0  PHQ - 2 Score 0 3 0  Altered sleeping - 1 -  Tired, decreased energy - 3 -  Change in appetite - 3 -  Feeling bad or failure about yourself  - 2 -  Trouble concentrating - 1 -  Moving slowly or fidgety/restless - 1 -  Suicidal thoughts - 0 -  PHQ-9 Score - 14 -    No Known Allergies Social History  Substance Use Topics  . Smoking status: Former Research scientist (life sciences)  . Smokeless tobacco: Never Used  . Alcohol use 3.0 - 3.6 oz/week    5 - 6 Glasses of wine per week   Past Medical History:  Diagnosis Date  . Allergy   . Anemia    pt states iron deficient  . Cyst of right breast  06/19/2014   US/mamm, confirmed cyst 37mm  . GERD (gastroesophageal reflux disease)   . Hiatal hernia    3/1//2016 Dr. Gala Lewandowsky  . Menorrhagia   . Murmur    Patient states echocardiogram was normal  . Tinnitus    ENT evaluated   Past Surgical History:  Procedure Laterality Date  . COLONOSCOPY  07/10/2011   rectal bleeding, FHX. Normal colonoscopy.   . ESOPHAGOGASTRODUODENOSCOPY  03/27/2013   hiatal hernia/GERD   Family History  Problem Relation Age of Onset  . Arthritis Mother   . Osteoporosis Mother        "severe"  . Cancer Sister   . Kidney cancer Sister   . Colon cancer Maternal Grandmother   . Ovarian cancer Maternal Grandmother   . Bone cancer Maternal Grandfather   . Heart disease Maternal Grandfather   . Breast cancer Paternal Grandmother    Allergies as of 09/16/2016   No Known Allergies     Medication List       Accurate as of 09/16/16  2:50 PM. Always use your most recent med list.          B-complex with vitamin C tablet Take 1 tablet by mouth daily.   BIOTIN 5000 5 MG Caps Generic drug:  Biotin Take 5,000 mg by mouth.   calcium-vitamin D 500-200 MG-UNIT tablet Take  1 tablet by mouth daily.   fexofenadine 180 MG tablet Commonly known as:  ALLEGRA ALLERGY Take 1 tablet (180 mg total) by mouth daily.   fluticasone 50 MCG/ACT nasal spray Commonly known as:  FLONASE IN THE NOSTRILS DAILY 2 SPRAYS IN EACH NOSTRIL DAILY   Ginkgo Biloba 40 MG Tabs Take 60 mg by mouth.   glucosamine-chondroitin 500-400 MG tablet Take 1 tablet by mouth 3 (three) times daily.   IRON-C PO Take 65 mg by mouth.   Melatonin 10 MG Tabs Take by mouth.   multivitamin with minerals tablet Take 1 tablet by mouth daily.   omeprazole 20 MG capsule Commonly known as:  PRILOSEC Take 1 capsule (20 mg total) by mouth daily.       All past medical history, surgical history, allergies, family history, immunizations andmedications were updated in the EMR today and reviewed  under the history and medication portions of their EMR.     ROS: Negative, with the exception of above mentioned in HPI   Objective:  BP 130/89 (BP Location: Right Arm, Patient Position: Sitting, Cuff Size: Normal)   Pulse 62   Temp 98.2 F (36.8 C)   Resp 20   Wt 189 lb 8 oz (86 kg)   SpO2 99%   BMI 34.66 kg/m  Body mass index is 34.66 kg/m. Gen: Afebrile. No acute distress. Nontoxic in appearance, well developed, well nourished.  HENT: AT. [place]. MMM Eyes:Pupils Equal Round Reactive to light, Extraocular movements intact,  Conjunctiva without redness, discharge or icterus. CV: RRR 1/6 systolic murmur present, no edema Chest: CTAB, no wheeze or crackles.  Abd: Soft. Obese. ND. Mild-moderate right upper quadrant discomfort with palpation. BS present. No Masses palpated. No rebound or guarding. Positive Murphy's with inspiration. Skin: No rashes, purpura or petechiae.  Neuro:  Normal gait. PERLA. EOMi. Alert. Oriented x3   No exam data present No results found. No results found for this or any previous visit (from the past 24 hour(s)).  Assessment/Plan: Rebecca Morrison is a 46 y.o. female present for OV for  RUQ pain/lower back (right ) pain Gastroesophageal reflux disease, esophagitis presence not specified - Encourage patient to start taking her omeprazole daily. Watch for food triggers and avoid. CMP and H. pylori collected today.  - Patient has concerns of her gallbladder and her family history of multiple different types of cancers including kidney, which she has right flank pain and mild low back pain. Discussed in agreed upon complete abdominal ultrasound, if needed will progress to HIDA scan or further imaging if warranted.  - COMPLETE METABOLIC PANEL WITH GFR - H. pylori antibody, IgG - US Abdomen Complete; Future - F/U 2 week sif not improved or studies indicate need.    Reviewed expectations re: course of current medical issues.  Discussed self-management of  symptoms.  Outlined signs and symptoms indicating need for more acute intervention.  Patient verbalized understanding and all questions were answered.  Patient received an After-Visit Summary.    No orders of the defined types were placed in this encounter.    Note is dictated utilizing voice recognition software. Although note has been proof read prior to signing, occasional typographical errors still can be missed. If any questions arise, please do not hesitate to call for verification.   electronically signed by:  Howard Pouch, DO  [place]

## 2016-09-16 NOTE — Patient Instructions (Signed)
Start omeprazole everyday.  I will call you with lab results and we will order a abdomen US.  We will touch base after results when I return and decide if we need to move forward with HIDA.   Avoid triggers.    Please help [place] help you:  We are honored you have chosen [facility] for your Primary Care home. Below you will find basic instructions that you may need to access in the future. Please help [place] help you by reading the instructions, which cover many of the frequent questions we experience.   Prescription refills and request:  -In order to allow more efficient response time, please call your pharmacy for all refills. They will forward the request electronically to [place]. This allows for the quickest possible response. Request left on a nurse line can take longer to refill, since these are checked as time allows between office patients and other phone calls.  - refill request can take up to 3-5 working days to complete.  - If request is sent electronically and request is appropiate, it is usually completed in 1-2 business days.  - all patients will need to be seen routinely for all chronic medical conditions requiring prescription medications (see follow-up below). If you are overdue for follow up on your condition, you will be asked to make an appointment and we will call in enough medication to cover you until your appointment (up to 30 days).  - all controlled substances will require a face to face visit to request/refill.  - if you desire your prescriptions to go through a new pharmacy, and have an active script at original pharmacy, you will need to call your pharmacy and have scripts transferred to new pharmacy. This is completed between the pharmacy locations and not by your provider.    Results: If any images or labs were ordered, it can take up to 1 week to get results depending on the test ordered and the lab/facility running and resulting the test. - Normal or stable results,  which do not need further discussion, may be released to your mychart immediately with attached note to you. A call may not be generated for normal results. Please make certain to sign up for mychart. If you have questions on how to activate your mychart you can call the front office.  - If your results need further discussion, our office will attempt to contact you via phone, and if unable to reach you after 2 attempts, we will release your abnormal result to your mychart with instructions.  - All results will be automatically released in mychart after 1 week.  - Your provider will provide you with explanation and instruction on all relevant material in your results. Please keep in mind, results and labs may appear confusing or abnormal to the untrained eye, but it does not mean they are actually abnormal for you personally. If you have any questions about your results that are not covered, or you desire more detailed explanation than what was provided, you should make an appointment with your provider to do so.   Our office handles many outgoing and incoming calls daily. If we have not contacted you within 1 week about your results, please check your mychart to see if there is a message first and if not, then contact our office.  In helping with this matter, you help decrease call volume, and therefore allow [place] to be able to respond to patients needs more efficiently.   Acute office  visits (sick visit):  An acute visit is intended for a new problem and are scheduled in shorter time slots to allow schedule openings for patients with new problems. This is the appropriate visit to discuss a new problem. In order to provide you with excellent quality medical care with proper time for you to explain your problem, have an exam and receive treatment with instructions, these appointments should be limited to one new problem per visit. If you experience a new problem, in which you desire to be addressed, please  make an acute office visit, we save openings on the schedule to accommodate you. Please do not save your new problem for any other type of visit, let us take care of it properly and quickly for you.   Follow up visits:  Depending on your condition(s) your provider will need to see you routinely in order to provide you with quality care and prescribe medication(s). Most chronic conditions (Example: hypertension, Diabetes, depression/anxiety... etc), require visits a couple times a year. Your provider will instruct you on proper follow up for your personal medical conditions and history. Please make certain to make follow up appointments for your condition as instructed. Failing to do so could result in lapse in your medication treatment/refills. If you request a refill, and are overdue to be seen on a condition, we will always provide you with a 30 day script (once) to allow you time to schedule.    Medicare wellness (well visit): - we have a wonderful Nurse Maudie Mercury), that will meet with you and provide you will yearly medicare wellness visits. These visits should occur yearly (can not be scheduled less than 1 calendar year apart) and cover preventive health, immunizations, advance directives and screenings you are entitled to yearly through your medicare benefits. Do not miss out on your entitled benefits, this is when medicare will pay for these benefits to be ordered for you.  These are strongly encouraged by your provider and is the appropriate type of visit to make certain you are up to date with all preventive health benefits. If you have not had your medicare wellness exam in the last 12 months, please make certain to schedule one by calling the office and schedule your medicare wellness with Maudie Mercury as soon as possible.   Yearly physical (well visit):  - Adults are recommended to be seen yearly for physicals. Check with your insurance and date of your last physical, most insurances require one calendar  year between physicals. Physicals include all preventive health topics, screenings, medical exam and labs that are appropriate for gender/age and history. You may have fasting labs needed at this visit. This is a well visit (not a sick visit), new problems should not be covered during this visit (see acute visit).  - Pediatric patients are seen more frequently when they are younger. Your provider will advise you on well child visit timing that is appropriate for your their age. - This is not a medicare wellness visit. Medicare wellness exams do not have an exam portion to the visit. Some medicare companies allow for a physical, some do not allow a yearly physical. If your medicare allows a yearly physical you can schedule the medicare wellness with our nurse Maudie Mercury and have your physical with your provider after, on the same day. Please check with insurance for your full benefits.   Late Policy/No Shows:  - all new patients should arrive 15-30 minutes earlier than appointment to allow [place] time  to  obtain  all personal demographics,  insurance information and for you to complete office paperwork. - All established patients should arrive 10-15 minutes earlier than appointment time to update all information and be checked in .  - In our best efforts to run on time, if you are late for your appointment you will be asked to either reschedule or if able, we will work you back into the schedule. There will be a wait time to work you back in the schedule,  depending on availability.  - If you are unable to make it to your appointment as scheduled, please call 24 hours ahead of time to allow [place] to fill the time slot with someone else who needs to be seen. If you do not cancel your appointment ahead of time, you may be charged a no show fee.

## 2016-09-17 ENCOUNTER — Telehealth: Payer: Self-pay | Admitting: Family Medicine

## 2016-09-17 LAB — H. PYLORI ANTIBODY, IGG: H Pylori IgG: NEGATIVE

## 2016-09-17 NOTE — Telephone Encounter (Signed)
Please call pt: - all labs are normal. If she has not already, she should be receiving a call to schedule [place]. If it is completed next week, I am out of the country. If there is a concerning result, she will be contacted by my partner, otherwise we call her to discuss when I return.

## 2016-09-18 NOTE — Telephone Encounter (Signed)
Spoke with patient reviewed lab results. She states she will be out of town next week so she probably wont have [place] until she gets back.

## 2016-10-09 ENCOUNTER — Telehealth: Payer: Self-pay | Admitting: Family Medicine

## 2016-10-09 ENCOUNTER — Ambulatory Visit (HOSPITAL_BASED_OUTPATIENT_CLINIC_OR_DEPARTMENT_OTHER)
Admission: RE | Admit: 2016-10-09 | Discharge: 2016-10-09 | Disposition: A | Discharge status: Home / Self Care | Payer: 59 | Source: Ambulatory Visit | Attending: Family Medicine | Admitting: Family Medicine

## 2016-10-09 DIAGNOSIS — K824 Cholesterolosis of gallbladder: Secondary | ICD-10-CM | POA: Diagnosis not present

## 2016-10-09 DIAGNOSIS — K769 Liver disease, unspecified: Secondary | ICD-10-CM | POA: Insufficient documentation

## 2016-10-09 DIAGNOSIS — R1011 Right upper quadrant pain: Secondary | ICD-10-CM | POA: Insufficient documentation

## 2016-10-09 NOTE — Telephone Encounter (Signed)
Please call pt: - her abd [place] looked ok. No gallstones or sludge.  It did show evidence of "fatty liver" which is usually not concerning, but can cause vague RUQ discomfort. Continue low fat diet and exercise is the recommendations.  - She also had a very small GB polyp, also not concerning being so small. Guidelines concerning small polyps are to repeat US in 1 year to make sure stable.  - If she is improving with use of daily medicine, then I would not proceed forward on evaluation. If she is not improving then would need to follow up and discuss proceeding with further studies.

## 2016-10-09 NOTE — Telephone Encounter (Signed)
Spoke with patient reviewed results and instructions patient verbalized understanding of all instructions.

## 2016-11-05 ENCOUNTER — Ambulatory Visit (INDEPENDENT_AMBULATORY_CARE_PROVIDER_SITE_OTHER): Payer: 59 | Admitting: Gastroenterology

## 2016-11-05 ENCOUNTER — Encounter: Payer: Self-pay | Admitting: Gastroenterology

## 2016-11-05 VITALS — BP 120/86 | HR 76 | Ht 62.0 in | Wt 191.0 lb

## 2016-11-05 DIAGNOSIS — K760 Fatty (change of) liver, not elsewhere classified: Secondary | ICD-10-CM | POA: Diagnosis not present

## 2016-11-05 DIAGNOSIS — R1011 Right upper quadrant pain: Secondary | ICD-10-CM

## 2016-11-05 DIAGNOSIS — K5900 Constipation, unspecified: Secondary | ICD-10-CM

## 2016-11-05 DIAGNOSIS — K824 Cholesterolosis of gallbladder: Secondary | ICD-10-CM

## 2016-11-05 DIAGNOSIS — R1013 Epigastric pain: Secondary | ICD-10-CM

## 2016-11-05 DIAGNOSIS — R140 Abdominal distension (gaseous): Secondary | ICD-10-CM | POA: Diagnosis not present

## 2016-11-05 MED ORDER — POLYETHYLENE GLYCOL 3350 17 GM/SCOOP PO POWD: ORAL | 5 refills | Status: DC

## 2016-11-05 NOTE — Progress Notes (Signed)
HPI :  46 y/o female with a history of RUQ pain, GERD, remote anemia, menorrhagia, here for a consultation visit at the request of Dr. Howard Pouch for abdominal pain.  RUQ pain / epigastric pain - she thinks she feels RUQ pain a few times per week. occasoinally can go to her right shoulder. Lasts for a few minutes and then goes away. Eating fatty foods will produce her symptoms. Within a few hours she will feel pain. And then it goes away. No nausea or vomiting. She denies any heartburn.  She is on omeprazole 20mg  once daily but had been off it for months, been on it for the past month. It has helped her pain a little bit but not much. H pylori negative. She's had a prior EGD x 2, she thinks in 2008 and then again 3-4 years ago. She reports a history of hiatal hernia. RUQ pain ongoing for roughly 6 years at this point, stable over time, she thinks it has been slightly better since eating better. She's had a HIDA scan around 2013, told EF was low and referred to have cholecystectomy but she did not follow through. Recent US as below, small GB polyps but no gallstones. Regarding her epigastric pain, she reports it is tender to touch at the breast bone. She wears a tight fitting bra which makes her pain worse.  She has gained about 20 lbs in the past 2 years. She is trying to lose weight, using [employer], and tried changing diet. 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 Fatty liver noted on [place]. She has 2 drinks per night, micholob ultra beer, occasional red wine. No FH of liver disease.   She has baseline constipation - she has one BM once per twice per day, she thinks usually passing a small amount of hard stool.  Not taking much for constipation. She feels bloating and gas with lower abdominal cramps. No blood in the stools. Grandmother had colon cancer. Sister with kidney cancer at age 45s. Colonoscopy done in 2013 which was normal. She thinks she has had a prior EGD with biopsies of her small  intestine showing no evidence of celiac, done in [place]. She is taking a nature-maid probiotic - has not helped.   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 Allergy   . Anemia    pt states iron deficient  . Cyst of right breast 06/19/2014   US/mamm, confirmed cyst 30mm  . GERD (gastroesophageal reflux disease)   . Hiatal hernia    3/1//2016 Dr. Gala Lewandowsky  . Menorrhagia   . Murmur    Patient states echocardiogram was normal  . Tinnitus    ENT evaluated     Past Surgical History:  Procedure Laterality Date  . COLONOSCOPY  07/10/2011   rectal bleeding, FHX. Normal colonoscopy.   . ESOPHAGOGASTRODUODENOSCOPY  03/27/2013   hiatal hernia/GERD   Family History  Problem Relation Age of Onset  . Arthritis Mother   . Osteoporosis Mother        "severe"  . Cancer Sister   . Kidney cancer Sister   . Colon cancer Maternal Grandmother   . Ovarian cancer Maternal Grandmother   .  Bone cancer Maternal Grandfather   . Heart disease Maternal Grandfather   . Breast cancer Paternal Grandmother    Social History  Substance Use Topics  . Smoking status: Former Research scientist (life sciences)  . Smokeless tobacco: Never Used  . Alcohol use 3.0 - 3.6 oz/week    5 - 6 Glasses of wine per week   Current Outpatient Prescriptions  Medication Sig Dispense Refill  . glucosamine-chondroitin 500-400 MG tablet Take 1 tablet by mouth 3 (three) times daily.    . Melatonin 10 MG TABS Take by mouth.    . Multiple Vitamins-Minerals (MULTIVITAMIN WITH MINERALS) tablet Take 1 tablet by mouth daily.    Marland Kitchen omeprazole (PRILOSEC) 20 MG capsule Take 1 capsule (20 mg total) by mouth daily. 90 capsule 3   No current  facility-administered medications for this visit.    No Known Allergies   Review of Systems: All systems reviewed and negative except where noted in HPI.    US Abdomen Complete  Result Date: 10/09/2016 CLINICAL DATA:  Chronic right upper quadrant pain. Recent onset of intermittent flank pain. History of abnormal gallbladder function in the past according to the patient. EXAM: ABDOMEN ULTRASOUND COMPLETE COMPARISON:  None in PACs FINDINGS: Gallbladder: The gallbladder is adequately distended. A 3 mm diameter polyp is observed. No stones or sludge are demonstrated. There is no wall thickening, pericholecystic fluid, or positive sonographic Murphy's sign. Common bile duct: Diameter: 4.8 mm. Liver: The hepatic echotexture is increased diffusely. The surface contour remains smooth. There is no focal mass or ductal dilation. IVC: No abnormality visualized. Pancreas: There is limited visualization of the pancreatic head and tail. The pancreatic body is unremarkable. Spleen: Size and appearance within normal limits. Right Kidney: Length: 11.4 cm. Echogenicity within normal limits. No mass or hydronephrosis visualized. Left Kidney: Length: 11 cm. The cortical echotexture is normal. There is a lower pole cyst measuring 1.6 cm in greatest dimension. There is no hydronephrosis. Abdominal aorta: No aneurysm visualized. Other findings: There is no ascites. IMPRESSION: Gallbladder polyp. No evidence of stones or sonographic evidence of acute cholecystitis. If there are clinical concerns of gallbladder dysfunction, a nuclear medicine hepatobiliary scan with gallbladder ejection fraction determination may be useful. Increased hepatic echotexture compatible with fatty infiltrative change. No acute abnormality observed within the abdomen. Electronically Signed   By: David  Martinique M.D.   On: 10/09/2016 09:12    Lab Results  Component Value Date   WBC 3.6 (L) 07/07/2016   HGB 12.9 07/07/2016   HCT 39.1 07/07/2016    MCV 88.9 07/07/2016   PLT 180.0 07/07/2016    Lab Results  Component Value Date   CREATININE 0.73 09/16/2016   BUN 8 09/16/2016   NA 137 09/16/2016   K 4.4 09/16/2016   CL 104 09/16/2016   CO2 21 09/16/2016    Lab Results  Component Value Date   ALT 25 09/16/2016   AST 17 09/16/2016   ALKPHOS 59 09/16/2016   BILITOT 0.5 09/16/2016     Physical Exam: BP 120/86   Pulse 76   Ht 5\' 2"  (1.575 m)   Wt 191 lb (86.6 kg)   LMP 11/05/2016   BMI 34.93 kg/m  Constitutional: Pleasant,well-developed, female in no acute distress. HEENT: Normocephalic and atraumatic. Conjunctivae are normal. No scleral icterus. Neck supple.  Cardiovascular: Normal rate, regular rhythm.  Pulmonary/chest: Effort normal and breath sounds normal. No wheezing, rales or rhonchi. Abdominal: Soft, nondistended, epigastric TTP, (+) [place].  There are no masses palpable. No hepatomegaly. Extremities:  no edema Lymphadenopathy: No cervical adenopathy noted. Neurological: Alert and oriented to person place and time. Skin: Skin is warm and dry. No rashes noted. Psychiatric: Normal mood and affect. Behavior is normal.   ASSESSMENT AND PLAN: 46 year old female with medical history as outlined above, seen in consultation today with multiple issues as outlined below:  RUQ pain - symptoms are concerning for biliary colic, have been long-standing. She has had a recent ultrasound which did not show any gallstones, although endorses a prior HIDA scan showing low ejection fraction. She could have bladder dyskinesia. She's had a prior EGD which she states did not show a cause, we will attempt to get this report. PPI has not provided benefit. At this point we discussed having her see a surgeon to discuss cholecystectomy. She was agreeable to this, will refer to Dr. Donne Hazel. I will try to obtain prior HIDA scan result to confirm findings.  Epigastric pain - I think she has costochondritis / musculoskeletal pain, easily  reproducible / superficial on exam. She will avoid tight fitting bras, can use tylenol or NSAIDs PRN  Fatty liver - noted incidentally on liver imaging. Her LFTs are normal. I counseled her on the spectrum of fatty liver disease, risks for cirrhosis. It's unclear whether or not this related to alcohol use or weight gain. Recommend she abstain from alcohol, and focus on diet and exercise for weight loss. Hopefully alcohol abstinence will help with weight loss. She should have her LFTs checked once yearly for this issue, and encourage routine coffee intake to minimize risk of fibrotic changes.   Gallbladder polyp - this warrants repeat ultrasound in one year if she does not undergo cholecystectomy in the interim. Reassured her this likely a benign small lesion.   Constipation - we'll try MiraLAX 17 g once daily, can titrate up as needed   Bloating - hopefully treatment of constipation help this, also counseled her on a low FODMAP diet to try. We will obtain prior lab work from her prior GI to obtain prior celiac test result, ensure negative.  Seymour Cellar, MD [facility] Pager [phone]  CC: [name], DO

## 2016-11-05 NOTE — Patient Instructions (Signed)
We have sent the following medications to your pharmacy for you to pick up at your convenience:  Miralax, take once daily  We have given you a Low FOD-Map diet to follow.    Please discontinue drinking alcohol.  We have requested your records from Dr. Alvina Filbert.  We are referring you to [facility], Dr. Donne Hazel.  They will call you to schedule an appointment.  If you have not heard from them within a week or two, please give [place] a call.    If you are age 57 or older, your body mass index should be between 23-30. Your Body mass index is 34.93 kg/m. If this is out of the aforementioned range listed, please consider follow up with your Primary Care Provider.  If you are age 27 or younger, your body mass index should be between 19-25. Your Body mass index is 34.93 kg/m. If this is out of the aformentioned range listed, please consider follow up with your Primary Care Provider.   Thank you.

## 2016-11-11 ENCOUNTER — Telehealth: Payer: Self-pay | Admitting: Gastroenterology

## 2016-11-11 ENCOUNTER — Telehealth: Payer: Self-pay

## 2016-11-11 NOTE — Telephone Encounter (Signed)
Received confirmation fax from [facility] that pt has appt with Dr. Donne Hazel on 12-05-16 at 9:50am  Patient was verbally notified.

## 2016-11-11 NOTE — Telephone Encounter (Signed)
Thanks for the update Jan

## 2016-11-11 NOTE — Telephone Encounter (Signed)
Records from prior workup for this patient arrived from [facility]:  EGD 06/02/2013 - LA grade A esophagitis, small hiatal hernia, otherwise normal exam - H pylori negative, biopsies of distal esophagus show around 15 Eos / HPF although thought to be due to reflux Colonoscopy 06/20/2011 - normal EGD 02/15/2009 - superficial erosions at z-line, hiatal hernia, gastritis - normal duodenal biopsies, no celaic, increased Eos at distal esophagus  Chart reports gross findings concerning more from reflux than true EoE, She has not responded to PPIs in the past. No dysphagia.  We will await her surgical evaluation. No HIDA scan report available although she endorsed low EF previously

## 2016-12-08 ENCOUNTER — Telehealth: Payer: Self-pay | Admitting: Gastroenterology

## 2016-12-08 NOTE — Telephone Encounter (Addendum)
Mrs Catania is upset that we did not have prior GI records before she came for her appointment. Doesn't understand how the physician can determine her needs if he did not have her prior history. Spouse tells me that they believe a fee will be charged  for prior records to be reviewed by physician. Wants to know  If physician would be willing to go over what his thoughts are on prior gi history. Call back phone number is [phone].Mrs Bevacqua feels very strongly that she should have another Colonoscopy scheduled at this time due to strong family history.

## 2016-12-09 NOTE — Telephone Encounter (Signed)
Called patient and left a message. Husband had originally called in, I called him and answered his questions, he would prefer I speak with the patient. I will call her back again later today

## 2016-12-09 NOTE — Telephone Encounter (Signed)
Called patient back and spoke about her records that came in. She's a bit anxious about having a colonoscopy - her grandmother had colon cancer, no other first degree relatives. Her last exam was 5 years ago, no polyps, this was her second colonoscopy. She states she was told she needed the exam every 5 years for screening purposes by her last GI physician. I discussed national guidelines for colon cancer screening - given she has one second degree relative with colon cancer, her last exam without polyps, I don't feel strongly she needs one at this time unless she is having a change in bowel symptoms or wishes to have one for piece of mind about this issue. I offered her a colonoscopy in this light, she wishes to think about it and will call me back if she wishes to schedule.   Otherwise we discussed her gallbladder polyp, upcoming visit with the surgeon. I defer to the surgeon if they want to obtain a HIDA scan versus having cholecystectomy up front in regards to her possible biliary colic pain. She reported a prior abnormal HIDA scan but I did not get the record of this exam from her last provider. We also discussed her fatty liver - LFTs are normal, she has modified her diet a bit and will work on weight loss to minimize risk of inflammation, we will check her LFTs yearly.   She appreciated the call, I asked her to call me back regarding whether or not she wanted the colonoscopy. She agreed.

## 2017-03-27 ENCOUNTER — Ambulatory Visit (INDEPENDENT_AMBULATORY_CARE_PROVIDER_SITE_OTHER): Payer: 59 | Admitting: Family Medicine

## 2017-03-27 ENCOUNTER — Encounter: Payer: Self-pay | Admitting: Family Medicine

## 2017-03-27 VITALS — BP 133/87 | HR 78 | Temp 98.2°F | Resp 20 | Wt 195.5 lb

## 2017-03-27 DIAGNOSIS — J320 Chronic maxillary sinusitis: Secondary | ICD-10-CM | POA: Diagnosis not present

## 2017-03-27 MED ORDER — BENZONATATE 100 MG PO CAPS: 100.0000 mg | ORAL_CAPSULE | Freq: Two times a day (BID) | ORAL | 0 refills | Status: DC | PRN

## 2017-03-27 MED ORDER — HYDROCODONE-HOMATROPINE 5-1.5 MG/5ML PO SYRP: 5.0000 mL | ORAL_SOLUTION | Freq: Three times a day (TID) | ORAL | 0 refills | Status: DC | PRN

## 2017-03-27 MED ORDER — AMOXICILLIN-POT CLAVULANATE 875-125 MG PO TABS: 1.0000 | ORAL_TABLET | Freq: Two times a day (BID) | ORAL | 0 refills | Status: DC

## 2017-03-27 NOTE — Progress Notes (Signed)
Rebecca Morrison , 1970/08/22, 47 y.o., female MRN: [id number] Patient Care Team    Relationship Specialty Notifications Start End  [name], DO PCP - General Family Medicine  09/03/15     Chief Complaint  Patient presents with  . URI    cough,congestion      Subjective: Pt presents for an OV with complaints of cough of 1 month duration.  Associated symptoms include nasal congestion for 2 weeks. Sinus pressure, pain ,headache, swollen face today. She has a sore throat initially that has resolved. She denies fever, chills, nausea or vomit.  Pt has tried nyquil, sudafed and mucinex  to ease their symptoms.   Depression screen [facility] 2/9 03/27/2017 07/07/2016 10/30/2015 07/02/2015  Decreased Interest 0 0 1 0  Down, Depressed, Hopeless 0 0 2 0  PHQ - 2 Score 0 0 3 0  Altered sleeping - - 1 -  Tired, decreased energy - - 3 -  Change in appetite - - 3 -  Feeling bad or failure about yourself  - - 2 -  Trouble concentrating - - 1 -  Moving slowly or fidgety/restless - - 1 -  Suicidal thoughts - - 0 -  PHQ-9 Score - - 14 -    No Known Allergies Social History   Tobacco Use  . Smoking status: Former Research scientist (life sciences)  . Smokeless tobacco: Never Used  Substance Use Topics  . Alcohol use: Yes    Alcohol/week: 3.0 - 3.6 oz    Types: 5 - 6 Glasses of wine per week   Past Medical History:  Diagnosis Date  . Allergy   . Anemia    pt states iron deficient  . Cyst of right breast 06/19/2014   US/mamm, confirmed cyst 66mm  . GERD (gastroesophageal reflux disease)   . Hiatal hernia    3/1//2016 Dr. Gala Lewandowsky  . Menorrhagia   . Murmur    Patient states echocardiogram was normal  . Tinnitus    ENT evaluated   Past Surgical History:  Procedure Laterality Date  . COLONOSCOPY  07/10/2011   rectal bleeding, FHX. Normal colonoscopy.   . ESOPHAGOGASTRODUODENOSCOPY  03/27/2013   hiatal hernia/GERD   Family History  Problem Relation Age of Onset  . Arthritis Mother   . Osteoporosis Mother    "severe"  . Cancer Sister   . Kidney cancer Sister   . Colon cancer Maternal Grandmother   . Ovarian cancer Maternal Grandmother   . Bone cancer Maternal Grandfather   . Heart disease Maternal Grandfather   . Breast cancer Paternal Grandmother    Allergies as of 03/27/2017   No Known Allergies     Medication List        Accurate as of 03/27/17  3:45 PM. Always use your most recent med list.          amoxicillin-clavulanate 875-125 MG tablet Commonly known as:  AUGMENTIN Take 1 tablet by mouth 2 (two) times daily.   benzonatate 100 MG capsule Commonly known as:  TESSALON Take 1 capsule (100 mg total) by mouth 2 (two) times daily as needed for cough.   Dexlansoprazole 30 MG capsule Take 30 mg by mouth daily.   HYDROcodone-homatropine 5-1.5 MG/5ML syrup Commonly known as:  HYCODAN Take 5 mLs by mouth every 8 (eight) hours as needed for cough.       All past medical history, surgical history, allergies, family history, immunizations andmedications were updated in the EMR today and reviewed under the history and medication  portions of their EMR.     ROS: Negative, with the exception of above mentioned in HPI   Objective:  BP 133/87 (BP Location: Right Arm, Patient Position: Sitting, Cuff Size: Large)   Pulse 78   Temp 98.2 F (36.8 C)   Resp 20   Wt 195 lb 8 oz (88.7 kg)   LMP 03/25/2017   SpO2 100%   BMI 35.76 kg/m  Body mass index is 35.76 kg/m. Gen: Afebrile. No acute distress. Nontoxic in appearance, well developed, well nourished.  HENT: AT. [place]. Bilateral TM visualized with fullness bilateral, no erythema. . MMM, no oral lesions. Bilateral nares with erythema and drainage. Throat without erythema or exudates. Cough and hoarseness present.  Eyes:Pupils Equal Round Reactive to light, Extraocular movements intact,  Conjunctiva without redness, discharge or icterus. Neck/lymp/endocrine: Supple, no lymphadenopathy CV: RRR  Chest: CTAB, no wheeze or crackles.    Abd: Soft.NTND. BS present Neuro:  Normal gait. PERLA. EOMi. Alert. Oriented x3   No exam data present No results found. No results found for this or any previous visit (from the past 24 hour(s)).  Assessment/Plan: [name] is a 47 y.o. female present for OV for  Maxillary sinusitis, unspecified chronicity Rest, hydrate.  +/- flonase, mucinex (DM if cough), nettie pot or nasal saline.  Augmentin  prescribed, take until completed.  Hycodan cough syrup and tessalon perles.  If cough present it can last up to 6-8 weeks.  F/U 2 weeks of not improved.     Reviewed expectations re: course of current medical issues.  Discussed self-management of symptoms.  Outlined signs and symptoms indicating need for more acute intervention.  Patient verbalized understanding and all questions were answered.  Patient received an After-Visit Summary.    No orders of the defined types were placed in this encounter.    Note is dictated utilizing voice recognition software. Although note has been proof read prior to signing, occasional typographical errors still can be missed. If any questions arise, please do not hesitate to call for verification.   electronically signed by:  Howard Pouch, DO  [place]

## 2017-03-27 NOTE — Patient Instructions (Signed)
Rest, hydrate.  + flonase, mucinex (DM if cough), nettie pot or nasal saline.  Augmentin  prescribed, take until completed.  Tessalon perles cough  Hycodan cough syrup at night, no refills on this medication. This has a controlled substance in it.  If cough present it can last up to 6-8 weeks.  F/U 2 weeks of not improved.    Sinusitis, Adult Sinusitis is soreness and inflammation of your sinuses. Sinuses are hollow spaces in the bones around your face. They are located:  Around your eyes.  In the middle of your forehead.  Behind your nose.  In your cheekbones.  Your sinuses and nasal passages are lined with a stringy fluid (mucus). Mucus normally drains out of your sinuses. When your nasal tissues get inflamed or swollen, the mucus can get trapped or blocked so air cannot flow through your sinuses. This lets bacteria, viruses, and funguses grow, and that leads to infection. Follow these instructions at home: Medicines  Take, use, or apply over-the-counter and prescription medicines only as told by your doctor. These may include nasal sprays.  If you were prescribed an antibiotic medicine, take it as told by your doctor. Do not stop taking the antibiotic even if you start to feel better. Hydrate and Humidify  Drink enough water to keep your pee (urine) clear or pale yellow.  Use a cool mist humidifier to keep the humidity level in your home above 50%.  Breathe in steam for 10-15 minutes, 3-4 times a day or as told by your doctor. You can do this in the bathroom while a hot shower is running.  Try not to spend time in cool or dry air. Rest  Rest as much as possible.  Sleep with your head raised (elevated).  Make sure to get enough sleep each night. General instructions  Put a warm, moist washcloth on your face 3-4 times a day or as told by your doctor. This will help with discomfort.  Wash your hands often with soap and water. If there is no soap and water, use hand  sanitizer.  Do not smoke. Avoid being around people who are smoking (secondhand smoke).  Keep all follow-up visits as told by your doctor. This is important. Contact a doctor if:  You have a fever.  Your symptoms get worse.  Your symptoms do not get better within 10 days. Get help right away if:  You have a very bad headache.  You cannot stop throwing up (vomiting).  You have pain or swelling around your face or eyes.  You have trouble seeing.  You feel confused.  Your neck is stiff.  You have trouble breathing. This information is not intended to replace advice given to you by your health care provider. Make sure you discuss any questions you have with your health care provider. Document Released: 07/30/2007 Document Revised: 10/07/2015 Document Reviewed: 12/06/2014 Elsevier Interactive Patient Education  [employer].

## 2017-04-17 ENCOUNTER — Ambulatory Visit (INDEPENDENT_AMBULATORY_CARE_PROVIDER_SITE_OTHER): Payer: 59 | Admitting: Family Medicine

## 2017-04-17 ENCOUNTER — Encounter: Payer: Self-pay | Admitting: Family Medicine

## 2017-04-17 VITALS — BP 129/83 | HR 68 | Temp 98.4°F | Resp 16 | Ht 62.0 in | Wt 191.8 lb

## 2017-04-17 DIAGNOSIS — R69 Illness, unspecified: Secondary | ICD-10-CM | POA: Diagnosis not present

## 2017-04-17 DIAGNOSIS — B9789 Other viral agents as the cause of diseases classified elsewhere: Secondary | ICD-10-CM | POA: Diagnosis not present

## 2017-04-17 DIAGNOSIS — J111 Influenza due to unidentified influenza virus with other respiratory manifestations: Secondary | ICD-10-CM

## 2017-04-17 DIAGNOSIS — J069 Acute upper respiratory infection, unspecified: Secondary | ICD-10-CM

## 2017-04-17 LAB — POC INFLUENZA A&B (BINAX/QUICKVUE)
Influenza A, POC: NEGATIVE
Influenza B, POC: NEGATIVE

## 2017-04-17 NOTE — Patient Instructions (Signed)
Get otc generic robitussin DM OR Mucinex DM and use as directed on the packaging for cough and congestion. Use otc generic saline nasal spray 2-3 times per day to irrigate/moisturize your nasal passages.   

## 2017-04-17 NOTE — Progress Notes (Signed)
OFFICE VISIT  04/17/2017   CC:  Chief Complaint  Patient presents with  . URI   HPI:    Patient is a 47 y.o. Caucasian female who presents for recent respiratory symptoms.  Of note, on 03/27/17 she was seen for about a 1 mo long period of sinusitis sx's and was rx'd augmentin. This brought complete resolution of sx's.  About 48 h ago she started to get lots of nasal and sinus congestion, pressure, some cough, a bit achy/fatigued, subj fevers yesterday.  No n/v/d.  No wheeze, chest tightness, or SOB. Feeling a bit today but mainly congestion remains. Mucinex.  Benzonatate doesn't seem to help.  No known sick contacts. No flu vaccine this season.   Past Medical History:  Diagnosis Date  . Allergy   . Anemia    pt states iron deficient  . Cyst of right breast 06/19/2014   US/mamm, confirmed cyst 59mm  . GERD (gastroesophageal reflux disease)   . Hiatal hernia    3/1//2016 Dr. Gala Lewandowsky  . Menorrhagia   . Murmur    Patient states echocardiogram was normal  . Tinnitus    ENT evaluated    Past Surgical History:  Procedure Laterality Date  . COLONOSCOPY  07/10/2011   rectal bleeding, FHX. Normal colonoscopy.   . ESOPHAGOGASTRODUODENOSCOPY  03/27/2013   hiatal hernia/GERD    Outpatient Medications Prior to Visit  Medication Sig Dispense Refill  . Dexlansoprazole 30 MG capsule Take 30 mg by mouth daily.    Marland Kitchen amoxicillin-clavulanate (AUGMENTIN) 875-125 MG tablet Take 1 tablet by mouth 2 (two) times daily. (Patient not taking: Reported on 04/17/2017) 20 tablet 0  . benzonatate (TESSALON) 100 MG capsule Take 1 capsule (100 mg total) by mouth 2 (two) times daily as needed for cough. (Patient not taking: Reported on 04/17/2017) 20 capsule 0  . HYDROcodone-homatropine (HYCODAN) 5-1.5 MG/5ML syrup Take 5 mLs by mouth every 8 (eight) hours as needed for cough. (Patient not taking: Reported on 04/17/2017) 120 mL 0   No facility-administered medications prior to visit.     No Known  Allergies  ROS As per HPI  PE: Blood pressure 129/83, pulse 68, temperature 98.4 F (36.9 C), temperature source Oral, resp. rate 16, height 5\' 2"  (1.575 m), weight 191 lb 12 oz (87 kg), last menstrual period 04/08/2017, SpO2 97 %. VS: noted--normal. Gen: alert, NAD, NONTOXIC APPEARING. HEENT: eyes without injection, drainage, or swelling.  Ears: EACs clear, TMs with normal light reflex and landmarks.  Nose: Clear rhinorrhea, with some dried, crusty exudate adherent to mildly injected mucosa.  No purulent d/c.  No paranasal sinus TTP.  No facial swelling.  Throat and mouth without focal lesion.  No pharyngial swelling, erythema, or exudate.   Neck: supple, no LAD.   LUNGS: CTA bilat, nonlabored resps.   CV: RRR, no m/r/g. EXT: no c/c/e SKIN: no rash  LABS:  Flu A/B testing today: NEG  IMPRESSION AND PLAN:  Viral syndrome/resp illness. No sign of RAD, bact sinusitis, or pneumonia. She is improved today. Symptomatic care discussed.  An After Visit Summary was printed and given to the patient.  FOLLOW UP: Return if symptoms worsen or fail to improve.  Signed:  Crissie Sickles, MD           04/17/2017

## 2017-05-05 ENCOUNTER — Telehealth: Payer: Self-pay | Admitting: Family Medicine

## 2017-05-05 NOTE — Telephone Encounter (Signed)
Left detailed message with information and instructions on patient voice mail per DPR 

## 2017-05-05 NOTE — Telephone Encounter (Signed)
Copied from [place] [phone]. Topic: Quick Communication - See Telephone Encounter >> May 05, 2017 11:18 AM Ahmed Prima L wrote: CRM for notification. See Telephone encounter for:   05/05/17.  Pt stated she called her insurance for a follow up sonogram. The only places that she can go per insurance is [facility] [place]  [phone]

## 2017-05-05 NOTE — Telephone Encounter (Signed)
Please enter a 6 month follow up order from the 10/09/17 Abdominal US. Patient would like to go to [place]. Thanks

## 2017-05-05 NOTE — Telephone Encounter (Signed)
Note from Dr Raoul Pitch indicates repeat in 1 year not 6 months . Patient was referred to GI  I dont see anything indicating repeat US in 6 months from our notes.. Please advise

## 2017-05-05 NOTE — Telephone Encounter (Signed)
recommendations by GI are follow up US 1 year, which would occur in August 2019.  If she would like [place] to place order we will do that for her at her physical which she is due for at the end of May.  If she has GI follow up, they can also place for her.

## 2017-06-04 DIAGNOSIS — K828 Other specified diseases of gallbladder: Secondary | ICD-10-CM | POA: Diagnosis not present

## 2017-09-24 ENCOUNTER — Other Ambulatory Visit: Payer: Self-pay | Admitting: General Surgery

## 2017-09-24 DIAGNOSIS — K824 Cholesterolosis of gallbladder: Secondary | ICD-10-CM

## 2017-09-30 ENCOUNTER — Other Ambulatory Visit: Payer: 59

## 2017-10-05 DIAGNOSIS — D485 Neoplasm of uncertain behavior of skin: Secondary | ICD-10-CM | POA: Diagnosis not present

## 2017-10-05 DIAGNOSIS — D225 Melanocytic nevi of trunk: Secondary | ICD-10-CM | POA: Diagnosis not present

## 2017-10-05 DIAGNOSIS — D1801 Hemangioma of skin and subcutaneous tissue: Secondary | ICD-10-CM | POA: Diagnosis not present

## 2017-10-05 DIAGNOSIS — L821 Other seborrheic keratosis: Secondary | ICD-10-CM | POA: Diagnosis not present

## 2017-10-05 DIAGNOSIS — L814 Other melanin hyperpigmentation: Secondary | ICD-10-CM | POA: Diagnosis not present

## 2017-10-13 ENCOUNTER — Other Ambulatory Visit: Payer: Self-pay | Admitting: Family Medicine

## 2017-10-13 DIAGNOSIS — Z1231 Encounter for screening mammogram for malignant neoplasm of breast: Secondary | ICD-10-CM

## 2017-10-14 ENCOUNTER — Other Ambulatory Visit: Payer: 59

## 2017-10-19 ENCOUNTER — Ambulatory Visit
Admission: RE | Admit: 2017-10-19 | Discharge: 2017-10-19 | Disposition: A | Discharge status: Home / Self Care | Payer: 59 | Source: Ambulatory Visit | Attending: General Surgery | Admitting: General Surgery

## 2017-10-19 DIAGNOSIS — N281 Cyst of kidney, acquired: Secondary | ICD-10-CM | POA: Diagnosis not present

## 2017-10-19 DIAGNOSIS — K828 Other specified diseases of gallbladder: Secondary | ICD-10-CM | POA: Diagnosis not present

## 2017-10-19 DIAGNOSIS — K824 Cholesterolosis of gallbladder: Secondary | ICD-10-CM

## 2017-10-22 ENCOUNTER — Telehealth: Payer: Self-pay

## 2017-10-22 NOTE — Telephone Encounter (Signed)
Copied from [place] [phone]. Topic: General - Other >> Oct 22, 2017  3:15 PM Rebecca Morrison R wrote: Pt is calling in wanting to know if ultrasound results have been received

## 2017-10-23 NOTE — Telephone Encounter (Signed)
We did not order an [place] for this patient recently Dr Rolm Bookbinder ordered US abdomen and it was completed 10/19/17. Left message for patient she needs to contact ordering physician for results.

## 2017-11-11 ENCOUNTER — Ambulatory Visit
Admission: RE | Admit: 2017-11-11 | Discharge: 2017-11-11 | Disposition: A | Discharge status: Home / Self Care | Payer: 59 | Source: Ambulatory Visit | Attending: Family Medicine | Admitting: Family Medicine

## 2017-11-11 DIAGNOSIS — Z1231 Encounter for screening mammogram for malignant neoplasm of breast: Secondary | ICD-10-CM

## 2018-01-18 ENCOUNTER — Ambulatory Visit (INDEPENDENT_AMBULATORY_CARE_PROVIDER_SITE_OTHER): Payer: 59 | Admitting: Family Medicine

## 2018-01-18 ENCOUNTER — Encounter: Payer: Self-pay | Admitting: Family Medicine

## 2018-01-18 VITALS — BP 130/80 | HR 62 | Temp 98.3°F | Resp 16 | Ht 62.0 in | Wt 193.0 lb

## 2018-01-18 DIAGNOSIS — N62 Hypertrophy of breast: Secondary | ICD-10-CM

## 2018-01-18 DIAGNOSIS — Z1211 Encounter for screening for malignant neoplasm of colon: Secondary | ICD-10-CM

## 2018-01-18 DIAGNOSIS — Z130 Encounter for screening for diseases of the blood and blood-forming organs and certain disorders involving the immune mechanism: Secondary | ICD-10-CM | POA: Diagnosis not present

## 2018-01-18 DIAGNOSIS — F418 Other specified anxiety disorders: Secondary | ICD-10-CM | POA: Diagnosis not present

## 2018-01-18 DIAGNOSIS — Z01419 Encounter for gynecological examination (general) (routine) without abnormal findings: Secondary | ICD-10-CM

## 2018-01-18 DIAGNOSIS — Z79899 Other long term (current) drug therapy: Secondary | ICD-10-CM | POA: Diagnosis not present

## 2018-01-18 DIAGNOSIS — Z5181 Encounter for therapeutic drug level monitoring: Secondary | ICD-10-CM

## 2018-01-18 DIAGNOSIS — E669 Obesity, unspecified: Secondary | ICD-10-CM

## 2018-01-18 DIAGNOSIS — R413 Other amnesia: Secondary | ICD-10-CM | POA: Diagnosis not present

## 2018-01-18 DIAGNOSIS — Z0000 Encounter for general adult medical examination without abnormal findings: Secondary | ICD-10-CM | POA: Diagnosis not present

## 2018-01-18 DIAGNOSIS — Z131 Encounter for screening for diabetes mellitus: Secondary | ICD-10-CM

## 2018-01-18 HISTORY — DX: Other specified anxiety disorders: F41.8

## 2018-01-18 MED ORDER — ESCITALOPRAM OXALATE 20 MG PO TABS: 20.0000 mg | ORAL_TABLET | Freq: Every day | ORAL | 0 refills | Status: DC

## 2018-01-18 NOTE — Patient Instructions (Addendum)
Referred to: Dr. Mamie Levers (psychologist), Gastroenterology and Gynecology.  Start lexapro 20 mg daily and followup in 6 weeks.   Health Maintenance, Female Adopting a healthy lifestyle and getting preventive care can go a long way to promote health and wellness. Talk with your health care provider about what schedule of regular examinations is right for you. This is a good chance for you to check in with your provider about disease prevention and staying healthy. In between checkups, there are plenty of things you can do on your own. Experts have done a lot of research about which lifestyle changes and preventive measures are most likely to keep you healthy. Ask your health care provider for more information. Weight and diet Eat a healthy diet  Be sure to include plenty of vegetables, fruits, low-fat dairy products, and lean protein.  Do not eat a lot of foods high in solid fats, added sugars, or salt.  Get regular exercise. This is one of the most important things you can do for your health. ? Most adults should exercise for at least 150 minutes each week. The exercise should increase your heart rate and make you sweat (moderate-intensity exercise). ? Most adults should also do strengthening exercises at least twice a week. This is in addition to the moderate-intensity exercise.  Maintain a healthy weight  Body mass index (BMI) is a measurement that can be used to identify possible weight problems. It estimates body fat based on height and weight. Your health care provider can help determine your BMI and help you achieve or maintain a healthy weight.  For females 34 years of age and older: ? A BMI below 18.5 is considered underweight. ? A BMI of 18.5 to 24.9 is normal. ? A BMI of 25 to 29.9 is considered overweight. ? A BMI of 30 and above is considered obese.  Watch levels of cholesterol and blood lipids  You should start having your blood tested for lipids and cholesterol at 47  years of age, then have this test every 5 years.  You may need to have your cholesterol levels checked more often if: ? Your lipid or cholesterol levels are high. ? You are older than 47 years of age. ? You are at high risk for heart disease.  Cancer screening Lung Cancer  Lung cancer screening is recommended for adults 59-3 years old who are at high risk for lung cancer because of a history of smoking.  A yearly low-dose CT scan of the lungs is recommended for people who: ? Currently smoke. ? Have quit within the past 15 years. ? Have at least a 30-pack-year history of smoking. A pack year is smoking an average of one pack of cigarettes a day for 1 year.  Yearly screening should continue until it has been 15 years since you quit.  Yearly screening should stop if you develop a health problem that would prevent you from having lung cancer treatment.  Breast Cancer  Practice breast self-awareness. This means understanding how your breasts normally appear and feel.  It also means doing regular breast self-exams. Let your health care provider know about any changes, no matter how small.  If you are in your 20s or 30s, you should have a clinical breast exam (CBE) by a health care provider every 1-3 years as part of a regular health exam.  If you are 71 or older, have a CBE every year. Also consider having a breast X-ray (mammogram) every year.  If you have a  family history of breast cancer, talk to your health care provider about genetic screening.  If you are at high risk for breast cancer, talk to your health care provider about having an MRI and a mammogram every year.  Breast cancer gene (BRCA) assessment is recommended for women who have family members with BRCA-related cancers. BRCA-related cancers include: ? Breast. ? Ovarian. ? Tubal. ? Peritoneal cancers.  Results of the assessment will determine the need for genetic counseling and BRCA1 and BRCA2 testing.  Cervical  Cancer Your health care provider may recommend that you be screened regularly for cancer of the pelvic organs (ovaries, uterus, and vagina). This screening involves a pelvic examination, including checking for microscopic changes to the surface of your cervix (Pap test). You may be encouraged to have this screening done every 3 years, beginning at age 21.  For women ages 30-65, health care providers may recommend pelvic exams and Pap testing every 3 years, or they may recommend the Pap and pelvic exam, combined with testing for human papilloma virus (HPV), every 5 years. Some types of HPV increase your risk of cervical cancer. Testing for HPV may also be done on women of any age with unclear Pap test results.  Other health care providers may not recommend any screening for nonpregnant women who are considered low risk for pelvic cancer and who do not have symptoms. Ask your health care provider if a screening pelvic exam is right for you.  If you have had past treatment for cervical cancer or a condition that could lead to cancer, you need Pap tests and screening for cancer for at least 20 years after your treatment. If Pap tests have been discontinued, your risk factors (such as having a new sexual partner) need to be reassessed to determine if screening should resume. Some women have medical problems that increase the chance of getting cervical cancer. In these cases, your health care provider may recommend more frequent screening and Pap tests.  Colorectal Cancer  This type of cancer can be detected and often prevented.  Routine colorectal cancer screening usually begins at 47 years of age and continues through 47 years of age.  Your health care provider may recommend screening at an earlier age if you have risk factors for colon cancer.  Your health care provider may also recommend using home test kits to check for hidden blood in the stool.  A small camera at the end of a tube can be used to  examine your colon directly (sigmoidoscopy or colonoscopy). This is done to check for the earliest forms of colorectal cancer.  Routine screening usually begins at age 50.  Direct examination of the colon should be repeated every 5-10 years through 47 years of age. However, you may need to be screened more often if early forms of precancerous polyps or small growths are found.  Skin Cancer  Check your skin from head to toe regularly.  Tell your health care provider about any new moles or changes in moles, especially if there is a change in a mole's shape or color.  Also tell your health care provider if you have a mole that is larger than the size of a pencil eraser.  Always use sunscreen. Apply sunscreen liberally and repeatedly throughout the day.  Protect yourself by wearing long sleeves, pants, a wide-brimmed hat, and sunglasses whenever you are outside.  Heart disease, diabetes, and high blood pressure  High blood pressure causes heart disease and increases the risk of   stroke. High blood pressure is more likely to develop in: ? People who have blood pressure in the high end of the normal range (130-139/85-89 mm Hg). ? People who are overweight or obese. ? People who are African American.  If you are 18-39 years of age, have your blood pressure checked every 3-5 years. If you are 40 years of age or older, have your blood pressure checked every year. You should have your blood pressure measured twice-once when you are at a hospital or clinic, and once when you are not at a hospital or clinic. Record the average of the two measurements. To check your blood pressure when you are not at a hospital or clinic, you can use: ? An automated blood pressure machine at a pharmacy. ? A home blood pressure monitor.  If you are between 55 years and 79 years old, ask your health care provider if you should take aspirin to prevent strokes.  Have regular diabetes screenings. This involves taking a  blood sample to check your fasting blood sugar level. ? If you are at a normal weight and have a low risk for diabetes, have this test once every three years after 47 years of age. ? If you are overweight and have a high risk for diabetes, consider being tested at a younger age or more often. Preventing infection Hepatitis B  If you have a higher risk for hepatitis B, you should be screened for this virus. You are considered at high risk for hepatitis B if: ? You were born in a country where hepatitis B is common. Ask your health care provider which countries are considered high risk. ? Your parents were born in a high-risk country, and you have not been immunized against hepatitis B (hepatitis B vaccine). ? You have HIV or AIDS. ? You use needles to inject street drugs. ? You live with someone who has hepatitis B. ? You have had sex with someone who has hepatitis B. ? You get hemodialysis treatment. ? You take certain medicines for conditions, including cancer, organ transplantation, and autoimmune conditions.  Hepatitis C  Blood testing is recommended for: ? Everyone born from 1945 through 1965. ? Anyone with known risk factors for hepatitis C.  Sexually transmitted infections (STIs)  You should be screened for sexually transmitted infections (STIs) including gonorrhea and chlamydia if: ? You are sexually active and are younger than 47 years of age. ? You are older than 47 years of age and your health care provider tells you that you are at risk for this type of infection. ? Your sexual activity has changed since you were last screened and you are at an increased risk for chlamydia or gonorrhea. Ask your health care provider if you are at risk.  If you do not have HIV, but are at risk, it may be recommended that you take a prescription medicine daily to prevent HIV infection. This is called pre-exposure prophylaxis (PrEP). You are considered at risk if: ? You are sexually active and  do not regularly use condoms or know the HIV status of your partner(s). ? You take drugs by injection. ? You are sexually active with a partner who has HIV.  Talk with your health care provider about whether you are at high risk of being infected with HIV. If you choose to begin PrEP, you should first be tested for HIV. You should then be tested every 3 months for as long as you are taking PrEP. Pregnancy  If   you are premenopausal and you may become pregnant, ask your health care provider about preconception counseling.  If you may become pregnant, take 400 to 800 micrograms (mcg) of folic acid every day.  If you want to prevent pregnancy, talk to your health care provider about birth control (contraception). Osteoporosis and menopause  Osteoporosis is a disease in which the bones lose minerals and strength with aging. This can result in serious bone fractures. Your risk for osteoporosis can be identified using a bone density scan.  If you are 23 years of age or older, or if you are at risk for osteoporosis and fractures, ask your health care provider if you should be screened.  Ask your health care provider whether you should take a calcium or vitamin D supplement to lower your risk for osteoporosis.  Menopause may have certain physical symptoms and risks.  Hormone replacement therapy may reduce some of these symptoms and risks. Talk to your health care provider about whether hormone replacement therapy is right for you. Follow these instructions at home:  Schedule regular health, dental, and eye exams.  Stay current with your immunizations.  Do not use any tobacco products including cigarettes, chewing tobacco, or electronic cigarettes.  If you are pregnant, do not drink alcohol.  If you are breastfeeding, limit how much and how often you drink alcohol.  Limit alcohol intake to no more than 1 drink per day for nonpregnant women. One drink equals 12 ounces of beer, 5 ounces of  wine, or 1 ounces of hard liquor.  Do not use street drugs.  Do not share needles.  Ask your health care provider for help if you need support or information about quitting drugs.  Tell your health care provider if you often feel depressed.  Tell your health care provider if you have ever been abused or do not feel safe at home. This information is not intended to replace advice given to you by your health care provider. Make sure you discuss any questions you have with your health care provider. Document Released: 08/26/2010 Document Revised: 07/19/2015 Document Reviewed: 11/14/2014 Elsevier Interactive Patient Education  [employer].

## 2018-01-18 NOTE — Progress Notes (Signed)
Patient ID: Rebecca Morrison, female  DOB: Jun 24, 1970, 47 y.o.   MRN: [id number] Patient Care Team    Relationship Specialty Notifications Start End  [name], DO PCP - General Family Medicine  09/03/15     Chief Complaint  Patient presents with  . Annual Exam    Subjective:  Rebecca Morrison is a 47 y.o.  Female  present for CPE . All past medical history, surgical history, allergies, family history, immunizations, medications and social history were updated in the electronic medical record today. All recent labs, ED visits and hospitalizations within the last year were reviewed.  Macromastia: pt suffers from upper back issues intermittently. She is unable to exercise secondary to breast size and discomfort, even with a sports bra. She would like to consider breast reduction. Her Body mass index is 35.3 kg/m.   Depression w/anxiety:  She reports she has always been anxious. She had difficulties when first moving to area coping 2 years ago and tried effexor, but did not take it more than 2 weeks. She thinks is caused her to not be able to sleep. She has tried Wellbutrin in the past that made her sleepy. Many years ago she has been on paxil and felt it helped a great deal, but had sexual side effects.  She would like to consider trying treatment again.   Health maintenance:  Colonoscopy: Family history of colon cancer, personal history of rectal bleeding. Colonoscopy completed May 2013. Follow-up at that time was encouraged in 5 years. Patient's colonoscopy was normal at that time. Referral placed last year and they were unable to contact patient.  Mammogram: The patient has a family history of breast cancer. She has a personal history of cyst her right breast. Her last mammogram was completed 11/11/2017. Cervical cancer screening: PAP overdue- she has yet to establish with GYN Immunizations: Tetanus completed 06/14/2014, she has declined the  flu immunizations today Infectious disease  screening: HIV completed with last pregnancy. DEXA: History of osteoporosis in her mother Assistive device: none Oxygen YVO:PFYT Patient has a Dental home. Hospitalizations/ED visits: reviewed   Depression screen [facility] 2/9 01/18/2018 01/18/2018 03/27/2017 07/07/2016 10/30/2015  Decreased Interest 1 0 0 0 1  Down, Depressed, Hopeless 1 0 0 0 2  PHQ - 2 Score 2 0 0 0 3  Altered sleeping 3 - - - 1  Tired, decreased energy 3 - - - 3  Change in appetite 3 - - - 3  Feeling bad or failure about yourself  1 - - - 2  Trouble concentrating 1 - - - 1  Moving slowly or fidgety/restless 3 - - - 1  Suicidal thoughts 0 - - - 0  PHQ-9 Score 16 - - - 14  Difficult doing work/chores Somewhat difficult - - - -   GAD 7 : Generalized Anxiety Score 01/18/2018 10/30/2015  Nervous, Anxious, on Edge 3 1  Control/stop worrying 3 3  Worry too much - different things 3 3  Trouble relaxing 3 3  Restless 1 0  Easily annoyed or irritable 3 1  Afraid - awful might happen 3 3  Total GAD 7 Score 19 14  Anxiety Difficulty Somewhat difficult Somewhat difficult    Current Exercise Habits: Home exercise routine, Type of exercise: strength training/weights;treadmill, Time (Minutes): 30, Frequency (Times/Week): 3, Weekly Exercise (Minutes/Week): 90, Intensity: Mild Exercise limited by: cardiac condition(s)   Immunization History  Administered Date(s) Administered  . MMR 09/29/2002  . Tdap 02/15/2014, 06/14/2014  Past Medical History:  Diagnosis Date  . Allergy   . Anemia    pt states iron deficient  . Cyst of right breast 06/19/2014   US/mamm, confirmed cyst 28m  . GERD (gastroesophageal reflux disease)   . Hiatal hernia    3/1//2016 Dr. LGala Lewandowsky . Menorrhagia   . Murmur    Patient states echocardiogram was normal  . Tinnitus    ENT evaluated   No Known Allergies Past Surgical History:  Procedure Laterality Date  . COLONOSCOPY  07/10/2011   rectal bleeding, FHX. Normal colonoscopy.   .  ESOPHAGOGASTRODUODENOSCOPY  03/27/2013   hiatal hernia/GERD   Family History  Problem Relation Age of Onset  . Arthritis Mother   . Osteoporosis Mother        "severe"  . Cancer Sister   . Kidney cancer Sister   . Colon cancer Maternal Grandmother   . Ovarian cancer Maternal Grandmother   . Bone cancer Maternal Grandfather   . Heart disease Maternal Grandfather   . Breast cancer Paternal Grandmother    Social History   Socioeconomic History  . Marital status: Married    Spouse name: Not on file  . Number of children: 2  . Years of education: Not on file  . Highest education level: Not on file  Occupational History  . Not on file  Social Needs  . Financial resource strain: Not on file  . Food insecurity:    Worry: Not on file    Inability: Not on file  . Transportation needs:    Medical: Not on file    Non-medical: Not on file  Tobacco Use  . Smoking status: Former SResearch scientist (life sciences) . Smokeless tobacco: Never Used  Substance and Sexual Activity  . Alcohol use: Yes    Alcohol/week: 5.0 - 6.0 standard drinks    Types: 5 - 6 Glasses of wine per week  . Drug use: No  . Sexual activity: Yes    Birth control/protection: Surgical    Comment: partner-vasectomy  Lifestyle  . Physical activity:    Days per week: Not on file    Minutes per session: Not on file  . Stress: Not on file  Relationships  . Social connections:    Talks on phone: Not on file    Gets together: Not on file    Attends religious service: Not on file    Active member of club or organization: Not on file    Attends meetings of clubs or organizations: Not on file    Relationship status: Not on file  . Intimate partner violence:    Fear of current or ex partner: Not on file    Emotionally abused: Not on file    Physically abused: Not on file    Forced sexual activity: Not on file  Other Topics Concern  . 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   Former smoker, quit 2003.   Social alcohol use, no drug use.   Drinks caffeinated beverages, uses herbal remedies, takes a daily vitamin.   Wears her seatbelt, wears a bicycle helmet, exercises at least 3 times a week.   Smoke detector in the home, feels safe in her relationships.   Allergies as of 01/18/2018   No Known Allergies     Medication List        Accurate as of 01/18/18  4:31 PM. Always use your most recent med list.          Dexlansoprazole 30 MG capsule Take 30 mg by mouth daily.   escitalopram 20 MG tablet Commonly known as:  LEXAPRO Take 1 tablet (20 mg total) by mouth daily.       All past medical history, surgical history, allergies, family history, immunizations andmedications were updated in the EMR today and reviewed under the history and medication portions of their EMR.     No results found for this or any previous visit (from the past 2160 hour(s)).  Mm 3d Screen Breast Bilateral  Result Date: 11/11/2017 CLINICAL DATA:  Screening. EXAM: DIGITAL SCREENING BILATERAL MAMMOGRAM WITH TOMO AND CAD COMPARISON:  Previous exam(s). ACR Breast Density Category b: There are scattered areas of fibroglandular density. FINDINGS: There are no findings suspicious for malignancy. Images were processed with CAD. IMPRESSION: No mammographic evidence of malignancy. A result letter of this screening mammogram will be mailed directly to the patient. RECOMMENDATION: Screening mammogram in one year. (Code:SM-B-01Y) BI-RADS CATEGORY  1: Negative. Electronically Signed   By: Margarette Canada M.D.   On: 11/11/2017 15:37     ROS: 14 pt review of systems performed and negative (unless mentioned in an HPI)  Objective: BP 130/80 (BP Location: Right Arm, Patient Position: Sitting, Cuff Size: Normal)   Pulse 62   Temp 98.3 F (36.8 C) (Oral)   Resp 16   Ht '5\' 2"'$  (1.575 m)   Wt 193 lb (87.5 kg)   LMP 01/14/2018    SpO2 97%   BMI 35.30 kg/m  Gen: Afebrile. No acute distress. Nontoxic in appearance, well-developed, well-nourished,  Pleasant, obese, caucasian female.  HENT: AT. [place]. Bilateral TM visualized and normal in appearance, normal external auditory canal. MMM, no oral lesions, adequate dentition. Bilateral nares within normal limits. Throat without erythema, ulcerations or exudates. no Cough on exam, no hoarseness on exam. Eyes:Pupils Equal Round Reactive to light, Extraocular movements intact,  Conjunctiva without redness, discharge or icterus. Neck/lymp/endocrine: Supple,no lymphadenopathy, no thyromegaly CV: RRR no murmur, no edema, +2/4 P posterior tibialis pulses. no carotid bruits. No JVD. Chest: CTAB, no wheeze, rhonchi or crackles. Normal  Respiratory effort. good Air movement. Large pendulous breast Abd: Soft. flat. NTND. BS presetn. no Masses palpated. No hepatosplenomegaly. No rebound tenderness or guarding. Skin: no rashes, purpura or petechiae. Warm and well-perfused. Skin intact. Neuro/Msk:  Normal gait. PERLA. EOMi. Alert. Oriented x3.  Cranial nerves II through XII intact. Muscle strength 5/5 upper/lower extremity. DTRs equal bilaterally. Psych: Normal affect, dress and demeanor. Normal speech. Normal thought content and judgment.   No exam data present  Assessment/plan: Rebecca Morrison is a 47 y.o. female present for CPE. Encounter for long-term (current) use of medications/Encounter for monitoring long-term proton pump inhibitor therapy - Vitamin D (25 hydroxy) - Magnesium - B12 - Comp Met (CMET) Obesity (BMI 30-39.9) - diet and exercise encouraged - Lipid panel Screening for deficiency anemia - CBC Diabetes mellitus screening - HgB A1c Depression with anxiety/Memory loss - lengthy discussion around her needs and wants today. Her PHQ (16) an GAD (19) screenings are significant today. She would like to try a medication and attend therapy. - Start lexapro 20 mg QD. If not  effective consider trying effexor again-- she never  Took more than 2 weeks. - Tried: paxil (liked but sexual SE), wellbutrin (made her sleepy), effexor (she thinks she had a hard time sleeping).  - Vitamin D (25 hydroxy) - B12 -  TSH - Ambulatory referral to Psychology--> Dr. Mamie Levers F/u 6 weeks Colon cancer screening - would like second opinion- referred last year- that GI did not agree with her [facility] on colonoscopy timeline- she would like another opinion.  - Ambulatory referral to Gastroenterology Pap smear, as part of routine gynecological examination - needs routine GYN care.  - Ambulatory referral to Gynecology Macromastia: - would like to speak to a plastic surgeon (in network)  Encounter for preventive health examination Patient was encouraged to exercise greater than 150 minutes a week. Patient was encouraged to choose a diet filled with fresh fruits and vegetables, and lean meats. AVS provided to patient today for education/recommendation on gender specific health and safety maintenance. Colonoscopy: referral for 2nd opinion  Mammogram:  completed 11/11/2017. Cervical cancer screening: referred today- overdue Immunizations: Tetanus completed 06/14/2014, she has declined the  flu immunizations today Infectious disease screening: completed DEXA: routine screen  Return in about 1 year (around 01/19/2019) for CPE.  Electronically signed by: Howard Pouch, DO [address]

## 2018-01-19 ENCOUNTER — Telehealth: Payer: Self-pay | Admitting: Family Medicine

## 2018-01-19 DIAGNOSIS — D649 Anemia, unspecified: Secondary | ICD-10-CM

## 2018-01-19 DIAGNOSIS — E559 Vitamin D deficiency, unspecified: Secondary | ICD-10-CM | POA: Insufficient documentation

## 2018-01-19 DIAGNOSIS — E538 Deficiency of other specified B group vitamins: Secondary | ICD-10-CM | POA: Insufficient documentation

## 2018-01-19 LAB — HEMOGLOBIN A1C
EAG (MMOL/L): 5.2 (calc)
Hgb A1c MFr Bld: 4.9 % of total Hgb (ref <5.7)
MEAN PLASMA GLUCOSE: 94 (calc)

## 2018-01-19 LAB — COMPREHENSIVE METABOLIC PANEL
AG Ratio: 1.5 (calc) (ref 1.0–2.5)
ALBUMIN MSPROF: 4.1 g/dL (ref 3.6–5.1)
ALKALINE PHOSPHATASE (APISO): 57 U/L (ref 33–115)
ALT: 25 U/L (ref 6–29)
AST: 20 U/L (ref 10–35)
BUN: 11 mg/dL (ref 7–25)
CALCIUM: 9.2 mg/dL (ref 8.6–10.2)
CO2: 25 mmol/L (ref 20–32)
Chloride: 107 mmol/L (ref 98–110)
Creat: 0.77 mg/dL (ref 0.50–1.10)
Globulin: 2.8 g/dL (calc) (ref 1.9–3.7)
Glucose, Bld: 79 mg/dL (ref 65–99)
Potassium: 4.8 mmol/L (ref 3.5–5.3)
Sodium: 137 mmol/L (ref 135–146)
Total Bilirubin: 0.4 mg/dL (ref 0.2–1.2)
Total Protein: 6.9 g/dL (ref 6.1–8.1)

## 2018-01-19 LAB — CBC
HCT: 33.6 % — ABNORMAL LOW (ref 35.0–45.0)
Hemoglobin: 10.8 g/dL — ABNORMAL LOW (ref 11.7–15.5)
MCH: 26.3 pg — AB (ref 27.0–33.0)
MCHC: 32.1 g/dL (ref 32.0–36.0)
MCV: 82 fL (ref 80.0–100.0)
MPV: 14.3 fL — AB (ref 7.5–12.5)
PLATELETS: 199 10*3/uL (ref 140–400)
RBC: 4.1 10*6/uL (ref 3.80–5.10)
RDW: 14.8 % (ref 11.0–15.0)
WBC: 4.9 10*3/uL (ref 3.8–10.8)

## 2018-01-19 LAB — LIPID PANEL
CHOLESTEROL: 172 mg/dL (ref <200)
HDL: 59 mg/dL (ref >50)
LDL Cholesterol (Calc): 93 mg/dL (calc)
Non-HDL Cholesterol (Calc): 113 mg/dL (calc) (ref <130)
TRIGLYCERIDES: 104 mg/dL (ref <150)
Total CHOL/HDL Ratio: 2.9 (calc) (ref <5.0)

## 2018-01-19 LAB — MAGNESIUM: MAGNESIUM: 2.3 mg/dL (ref 1.5–2.5)

## 2018-01-19 LAB — VITAMIN D 25 HYDROXY (VIT D DEFICIENCY, FRACTURES): VIT D 25 HYDROXY: 26 ng/mL — AB (ref 30–100)

## 2018-01-19 LAB — TSH: TSH: 2.1 m[IU]/L

## 2018-01-19 LAB — VITAMIN B12: Vitamin B-12: 272 pg/mL (ref 211–911)

## 2018-01-19 MED ORDER — CYANOCOBALAMIN 500 MCG PO TABS: 500.0000 ug | ORAL_TABLET | Freq: Every day | ORAL | 3 refills | Status: DC

## 2018-01-19 MED ORDER — CHOLECALCIFEROL 25 MCG (1000 UT) PO TABS: 1000.0000 [IU] | ORAL_TABLET | Freq: Every day | ORAL | 3 refills | Status: DC

## 2018-01-19 NOTE — Telephone Encounter (Signed)
Please inform patient the following information: Her labs are normal with the following exceptions:  - She is anemic (low blood count) and her vit d and b12 are both lower than normal.  - I have called in a daily vit d and b12 for her to start- if insurance does not cover she can purchase OTc Vit D 1000u daily and b12 500 mcg daily.  - I have placed additional labs to be collected, both blood and FOBT cards for her to complete- by lab appt in the next week- to further investigate cause of her anemia. She does NOT have to fast. - if she is agreeable she can be set up for B12 injections by nurse visit once every 2 weeks, for 2 doses, then once monthly for 2 doses. This will help boost levels to normal.  F/u with provider dependent on lab and FOBT additional test

## 2018-01-20 NOTE — Telephone Encounter (Signed)
Patient notified and verbalized understanding. Patient will call back Monday morning to schedule appt. For labs. Patient is out of town and will discuss injections when coming in for lab draw.

## 2018-01-27 ENCOUNTER — Ambulatory Visit (INDEPENDENT_AMBULATORY_CARE_PROVIDER_SITE_OTHER): Payer: 59 | Admitting: Family Medicine

## 2018-01-27 ENCOUNTER — Encounter: Payer: Self-pay | Admitting: Family Medicine

## 2018-01-27 ENCOUNTER — Other Ambulatory Visit (INDEPENDENT_AMBULATORY_CARE_PROVIDER_SITE_OTHER): Payer: 59

## 2018-01-27 VITALS — BP 118/83 | HR 62 | Temp 97.4°F | Resp 16 | Ht 62.0 in | Wt 192.0 lb

## 2018-01-27 DIAGNOSIS — F418 Other specified anxiety disorders: Secondary | ICD-10-CM

## 2018-01-27 DIAGNOSIS — E538 Deficiency of other specified B group vitamins: Secondary | ICD-10-CM | POA: Diagnosis not present

## 2018-01-27 DIAGNOSIS — R1013 Epigastric pain: Secondary | ICD-10-CM

## 2018-01-27 DIAGNOSIS — R1011 Right upper quadrant pain: Secondary | ICD-10-CM | POA: Diagnosis not present

## 2018-01-27 DIAGNOSIS — D649 Anemia, unspecified: Secondary | ICD-10-CM | POA: Diagnosis not present

## 2018-01-27 DIAGNOSIS — R109 Unspecified abdominal pain: Secondary | ICD-10-CM

## 2018-01-27 MED ORDER — METHYLPREDNISOLONE ACETATE 80 MG/ML IJ SUSP: 80.0000 mg | Freq: Once | INTRAMUSCULAR | Status: DC

## 2018-01-27 NOTE — Progress Notes (Signed)
Rebecca Morrison , [birth year], 47 y.o., female MRN: [id number] Patient Care Team    Relationship Specialty Notifications Start End  [name], DO PCP - General Family Medicine  09/03/15     Chief Complaint  Patient presents with  . Dizziness    to lexapro, Dizziness, nausea, shakey, abdominal pain     Subjective:   Depression w/anxiety/abd -flank pain right:  Pt presents today stating she started the lexapro 20 mg yesterday after having a cup of coffee and felt nauseated, stomach burning, shaky, abdominal pain- epigastric and right flank discomfort. She admits she has not felt well a few days prior to the START of the medicine. She did have a few alcohol containing drinks over the weekend. She is worried about her kidneys. She has a h/o GERD.  Prior note:  She reports she has always been anxious. She had difficulties when first moving to area coping 2 years ago and tried effexor, but did not take it more than 2 weeks. She thinks is caused her to not be able to sleep. She has tried Wellbutrin in the past that made her sleepy. Many years ago she has been on paxil and felt it helped a great deal, but had sexual side effects.  She would like to consider trying treatment again.   Depression screen [facility] 2/9 01/18/2018 01/18/2018 03/27/2017 07/07/2016 10/30/2015  Decreased Interest 1 0 0 0 1  Down, Depressed, Hopeless 1 0 0 0 2  PHQ - 2 Score 2 0 0 0 3  Altered sleeping 3 - - - 1  Tired, decreased energy 3 - - - 3  Change in appetite 3 - - - 3  Feeling bad or failure about yourself  1 - - - 2  Trouble concentrating 1 - - - 1  Moving slowly or fidgety/restless 3 - - - 1  Suicidal thoughts 0 - - - 0  PHQ-9 Score 16 - - - 14  Difficult doing work/chores Somewhat difficult - - - -    No Known Allergies Social History   Tobacco Use  . Smoking status: Former Research scientist (life sciences)  . Smokeless tobacco: Never Used  Substance Use Topics  . Alcohol use: Yes    Alcohol/week: 5.0 - 6.0 standard drinks   Types: 5 - 6 Glasses of wine per week   Past Medical History:  Diagnosis Date  . Allergy   . Anemia    pt states iron deficient  . Cyst of right breast 06/19/2014   US/mamm, confirmed cyst 54mm  . GERD (gastroesophageal reflux disease)   . Hiatal hernia    3/1//2016 Dr. Gala Lewandowsky  . Menorrhagia   . Murmur    Patient states echocardiogram was normal  . Tinnitus    ENT evaluated   Past Surgical History:  Procedure Laterality Date  . COLONOSCOPY  07/10/2011   rectal bleeding, FHX. Normal colonoscopy.   . ESOPHAGOGASTRODUODENOSCOPY  03/27/2013   hiatal hernia/GERD   Family History  Problem Relation Age of Onset  . Arthritis Mother   . Osteoporosis Mother        "severe"  . Cancer Sister   . Kidney cancer Sister   . Colon cancer Maternal Grandmother   . Ovarian cancer Maternal Grandmother   . Bone cancer Maternal Grandfather   . Heart disease Maternal Grandfather   . Breast cancer Paternal Grandmother    Allergies as of 01/27/2018   No Known Allergies     Medication List  Accurate as of 01/27/18 10:17 AM. Always use your most recent med list.          Cholecalciferol 25 MCG (1000 UT) tablet Take 1 tablet (1,000 Units total) by mouth daily.   Dexlansoprazole 30 MG capsule Take 30 mg by mouth daily.   escitalopram 20 MG tablet Commonly known as:  LEXAPRO Take 1 tablet (20 mg total) by mouth daily.   vitamin B-12 500 MCG tablet Commonly known as:  CYANOCOBALAMIN Take 1 tablet (500 mcg total) by mouth daily.       All past medical history, surgical history, allergies, family history, immunizations andmedications were updated in the EMR today and reviewed under the history and medication portions of their EMR.     ROS: Negative, with the exception of above mentioned in HPI   Objective:  BP 118/83 (BP Location: Left Arm, Patient Position: Sitting, Cuff Size: Normal)   Pulse 62   Temp (!) 97.4 F (36.3 C) (Oral)   Resp 16   Ht 5\' 2"  (1.575 m)   Wt 192  lb (87.1 kg)   LMP 01/14/2018   SpO2 99%   BMI 35.12 kg/m  Body mass index is 35.12 kg/m. Gen: Afebrile. No acute distress. Nontoxic in appearance, well developed, well nourished.  HENT: AT. . MMM Eyes:Pupils Equal Round Reactive to light, Extraocular movements intact,  Conjunctiva without redness, discharge or icterus. CV: RRR  Chest: CTAB, no wheeze or crackles. .  Abd: Soft. obese. NTND. BS present. no Masses palpated. No rebound or guarding.  MSK: No CVA tenderness bilateral Psych: Normal affect, dress and demeanor. Normal speech. Normal thought content and judgment.  No exam data present No results found. No results found for this or any previous visit (from the past 24 hour(s)).  Assessment/Plan: Rebecca Morrison is a 47 y.o. female present for OV for  Right upper quadrant abdominal pain/Right flank pain/dyspepsia  - trial of Pepcid/prilosec OTC for 7-14 days. R/O potential kidney stones/UTI, pancreatitis (she was drinking over the holiday weekend) and LFT.  - Urinalysis, Routine w reflex microscopic - CMP - Lipase - If worsening f/u 2 weeks   Depression with anxiety - I am not convinced her symptoms were related to the lexapro, although dyspepsia is on the SE potential. She had already been not feeling well a few days prior to start of med. Advised her to treat the stomach issues per above recs, and then start 1/2 tab lexapro with food for 3 days--> if tolerates go to whole tab. If she does not tolerate, stop and call in to be seen.  - Start lexapro 20 mg QD. If not effective consider trying effexor again-- she never  Took more than 2 weeks. - Tried: paxil (liked but sexual SE), wellbutrin (made her sleepy), effexor (she thinks she had a hard time sleeping).  - Ambulatory referral to Psychology--> Dr. Mamie Levers F/U 6 weeks after restart of med.   Reviewed expectations re: course of current medical issues.  Discussed self-management of symptoms.  Outlined signs and  symptoms indicating need for more acute intervention.  Patient verbalized understanding and all questions were answered.  Patient received an After-Visit Summary.    No orders of the defined types were placed in this encounter.    Note is dictated utilizing voice recognition software. Although note has been proof read prior to signing, occasional typographical errors still can be missed. If any questions arise, please do not hesitate to call for verification.   electronically signed by:  Howard Pouch, DO  [facility] [place]

## 2018-01-27 NOTE — Patient Instructions (Signed)
Try 1/2 tab of lexapro for 2-3 days with food after you completed 1-2 weeks of Prilosec. If you are fine with it, then increase back to one full tab of lexapro.  I will let you know about your blood test and urine test once we get results.   I do not think this was related to the one dose of lexapro- but we will retest that theory after we get the stomach feeling better.

## 2018-01-28 ENCOUNTER — Telehealth: Payer: Self-pay | Admitting: Family Medicine

## 2018-01-28 MED ORDER — SUCRALFATE 1 G PO TABS: 1.0000 g | ORAL_TABLET | Freq: Three times a day (TID) | ORAL | 0 refills | Status: DC

## 2018-01-28 NOTE — Telephone Encounter (Signed)
Left message for patient to call back for results.    CRM created asking that they forward to me when patient returns call.

## 2018-01-28 NOTE — Telephone Encounter (Signed)
Please inform patient the following information: - one of her liver enzymes is  mildly elevated. - Her urine is very concentrated with ketones and some bacteria. We have sent it for culture to also rule out infection- we will call her w/ results. - These indicate dehydration and possible aftermath of alcohol use over the holiday. Alcohol is also a stomach irritant- which can be related to her stomach pain and nausea. I want her to take the prilosec and pepcid OTC, and I called in carafate to take before meals and bed (all 2 weeks). This will allow her stomach to heal.  - She has to increase her water consumption to at least 80 ounces a day.  Her iron stores are low, but her iron levels are normal. I am waiting on her  FOBT cards to result to see if blood loss is stool related or not. This indicates anemia may be from blood loss, depleting her iron stores. I would recommend she see GI with signs pointing to possible GI loss of blood and her new symptoms of epigastric pain--> could have a bleeding ulcer of the stomach.  In the meantime, try to incorporate more iron rich foods in her diet- or an OTC supplement once daily (with stool softener).   Does she have an appt yet? Referral placed for Digestive health specialist 01/18/2018. If not please contact them to make this more urgent with anemia and gastritis symptoms.   Follow here in 3-4 weeks if symptoms not improved with above recs--ONLY IF she has  not already seen her new GI.

## 2018-01-30 LAB — IRON,TIBC AND FERRITIN PANEL
%SAT: 35 % (calc) (ref 16–45)
FERRITIN: 12 ng/mL — AB (ref 16–232)
Iron: 152 ug/dL (ref 40–190)
TIBC: 431 ug/dL (ref 250–450)

## 2018-01-30 LAB — METHYLMALONIC ACID, SERUM: Methylmalonic Acid, Quant: 106 nmol/L (ref 87–318)

## 2018-02-01 ENCOUNTER — Telehealth: Payer: Self-pay | Admitting: Family Medicine

## 2018-02-01 LAB — TEST AUTHORIZATION

## 2018-02-01 LAB — URINALYSIS, ROUTINE W REFLEX MICROSCOPIC
BILIRUBIN URINE: NEGATIVE
GLUCOSE, UA: NEGATIVE
HGB URINE DIPSTICK: NEGATIVE
Nitrite: NEGATIVE
RBC / HPF: NONE SEEN /HPF (ref 0–2)
Specific Gravity, Urine: 1.028 (ref 1.001–1.03)
pH: 7.5 (ref 5.0–8.0)

## 2018-02-01 LAB — URINE CULTURE

## 2018-02-01 LAB — COMPREHENSIVE METABOLIC PANEL
AG RATIO: 1.4 (calc) (ref 1.0–2.5)
ALT: 32 U/L — AB (ref 6–29)
AST: 26 U/L (ref 10–35)
Albumin: 4 g/dL (ref 3.6–5.1)
Alkaline phosphatase (APISO): 57 U/L (ref 33–115)
BUN: 10 mg/dL (ref 7–25)
CHLORIDE: 104 mmol/L (ref 98–110)
CO2: 22 mmol/L (ref 20–32)
CREATININE: 0.76 mg/dL (ref 0.50–1.10)
Calcium: 9.3 mg/dL (ref 8.6–10.2)
GLOBULIN: 2.8 g/dL (ref 1.9–3.7)
Glucose, Bld: 107 mg/dL — ABNORMAL HIGH (ref 65–99)
Potassium: 4.4 mmol/L (ref 3.5–5.3)
Sodium: 137 mmol/L (ref 135–146)
Total Bilirubin: 0.7 mg/dL (ref 0.2–1.2)
Total Protein: 6.8 g/dL (ref 6.1–8.1)

## 2018-02-01 LAB — EXTRA URINE SPECIMEN

## 2018-02-01 LAB — LIPASE: LIPASE: 10 U/L (ref 7–60)

## 2018-02-02 ENCOUNTER — Ambulatory Visit: Payer: 59 | Admitting: Psychology

## 2018-02-02 ENCOUNTER — Telehealth: Payer: Self-pay

## 2018-02-02 NOTE — Telephone Encounter (Signed)
Message left on voice mail for patient to return call, message left for her to come by office to leave a UA for culture.

## 2018-02-02 NOTE — Telephone Encounter (Signed)
Unable to reach patient by phone, MyChart message sent.

## 2018-02-02 NOTE — Telephone Encounter (Signed)
Received the following email from [facility] regarding the urine culture.   Good Morning Rafel Garde, I checked with [facility] last night, and unfortunately they did not set up the culture on [place] since it was not in a Culture Tube.  If you would like to have her collect another sample, I will be glad to submit a credit to be done at No charge. Do you need me to order you some of the [facility] Urine Culture Tubes? I have also attached a UA that reflexes to a Culture if it has triggers to reflex that your Providers find useful. Let me know if you would like me to call [facility] and let them know. Thank you and have a great day! Viki

## 2018-02-02 NOTE — Telephone Encounter (Signed)
error 

## 2018-02-02 NOTE — Telephone Encounter (Signed)
Please explain to her the lab could not run the culture off the urine as I originally stated in prior phone note (which I do not believe anyone has been able to get a hold of her to tell her). If she could complete another urine for culture since original had a trace of white cells that would be appreciated.

## 2018-02-02 NOTE — Telephone Encounter (Signed)
Patient notified and verbalized understanding. 

## 2018-02-02 NOTE — Telephone Encounter (Signed)
Unable to reach patient by phone, my chart message sent.

## 2018-02-02 NOTE — Telephone Encounter (Signed)
Pt was scheduled by her PCP office to see Dennison Bulla, [facility] at the [facility] location. Pt states she was not given the address or phone number to his office here. When she tried to come to the appt she Googled his name and this brought up an old office address for him in [place], [place] so she attempted to go to that location. She got there and found he was not at that office. She attempted to contact the phone number in the Google information and that was not correct either. Pt was finally able to get in touch with our office, however by that time she would have been an hour late to her appt. We gave her all the correct contact information for Lanny Hurst and she will call to reschedule. Appt was cancelled with note to please not charge pt as she was not given location information for her appt.  Dr. Raoul Pitch - FYI. Thank you!

## 2018-02-03 ENCOUNTER — Other Ambulatory Visit: Payer: Self-pay

## 2018-02-03 DIAGNOSIS — R109 Unspecified abdominal pain: Secondary | ICD-10-CM

## 2018-02-04 DIAGNOSIS — R109 Unspecified abdominal pain: Secondary | ICD-10-CM | POA: Diagnosis not present

## 2018-02-04 NOTE — Addendum Note (Signed)
Addended by: Ralph Dowdy on: 02/04/2018 01:03 PM   Modules accepted: Orders

## 2018-02-07 LAB — URINE CULTURE
MICRO NUMBER: 91491115
SPECIMEN QUALITY:: ADEQUATE

## 2018-02-08 ENCOUNTER — Telehealth: Payer: Self-pay | Admitting: Family Medicine

## 2018-02-08 MED ORDER — CEFDINIR 300 MG PO CAPS: 300.0000 mg | ORAL_CAPSULE | Freq: Two times a day (BID) | ORAL | 0 refills | Status: DC

## 2018-02-08 NOTE — Telephone Encounter (Signed)
Please inform patient the following information: Her urine culture did show evidence of infection with growth of Strep bacteria.  Prescribed omnicef BID for 7 days.

## 2018-02-08 NOTE — Telephone Encounter (Signed)
Called pt and left v/m to check her mychart for her lab results. Lab results and information posted in pts mychart.

## 2018-02-10 ENCOUNTER — Other Ambulatory Visit: Payer: Self-pay | Admitting: Family Medicine

## 2018-02-10 DIAGNOSIS — D649 Anemia, unspecified: Secondary | ICD-10-CM

## 2018-02-15 ENCOUNTER — Other Ambulatory Visit: Payer: 59

## 2018-02-15 DIAGNOSIS — D649 Anemia, unspecified: Secondary | ICD-10-CM

## 2018-02-15 LAB — HEMOCCULT SLIDES (X 3 CARDS)
FECAL OCCULT BLD: NEGATIVE
OCCULT 1: NEGATIVE
OCCULT 2: NEGATIVE
OCCULT 3: NEGATIVE
OCCULT 4: NEGATIVE
OCCULT 5: NEGATIVE

## 2018-03-03 ENCOUNTER — Encounter: Payer: Self-pay | Admitting: Women's Health

## 2018-03-03 ENCOUNTER — Ambulatory Visit (INDEPENDENT_AMBULATORY_CARE_PROVIDER_SITE_OTHER): Payer: 59 | Admitting: Women's Health

## 2018-03-03 VITALS — BP 122/80 | Ht 62.0 in | Wt 192.0 lb

## 2018-03-03 DIAGNOSIS — N920 Excessive and frequent menstruation with regular cycle: Secondary | ICD-10-CM

## 2018-03-03 DIAGNOSIS — Z01419 Encounter for gynecological examination (general) (routine) without abnormal findings: Secondary | ICD-10-CM

## 2018-03-03 MED ORDER — DIAZEPAM 5 MG PO TABS: ORAL_TABLET | ORAL | 0 refills | Status: DC

## 2018-03-03 NOTE — Progress Notes (Signed)
Rebecca Morrison Mar 03, 1970 [id number]   History:  66 MWF, G2P2, presents for annual exam with complaint of regular monthly heavy menstrual cycles, vasectomy.  No bleeding  in between cycles, uses ultra tampons, changes them every hour on heaviest days.  Bleeding last 5 days, 21-day cycle.  Abnormal Pap in 2001, LEEP procedure  CIN 2, normal Paps after.  Anemic for years is on iron pills, IBS, arthritis, knee cyst and recent kidney infection with strep that resolved with antibiotics.  Has had 2 endoscopies and 2 colonoscopies in the past is scheduled for colonoscopy this year, due to IBS and multiple GI problems as well as colon cancer in Maternal grandmother, also has ovarian cancer.  Sister has history of kidney cancer, father possibly died of colon cancer.  Pendulous breasts, with upper back and neck pain, considering reduction.  Reports no menopausal symptoms at this time. Sexually active  Past medical history, past surgical history, family history and social history were all reviewed and documented in the EPIC chart.  Stay-at-home mom, 2 boys ages 48 and 72 both doing well.  Retired Theme park manager , moved here 3 years ago from [place] has been having a hard time adjusting, misses friends.    ROS:  A ROS was performed and pertinent positives and negatives are included.   Exam:  Vitals:   03/03/18 1022  BP: 122/80  Weight: 192 lb (87.1 kg)  Height: 5\' 2"  (1.575 m)   Body mass index is 35.12 kg/m.   General appearance:  Normal Thyroid:  Symmetrical, normal in size, without palpable masses or nodularity. Respiratory  Auscultation:  Clear without wheezing or rhonchi Cardiovascular  Auscultation:  Regular rate, without rubs, murmurs or gallops  Edema/varicosities:  Not grossly evident Abdominal  Soft,nontender, without masses, guarding or rebound.  Liver/spleen:  No organomegaly noted  Hernia:  None appreciated  Skin  Inspection:  Grossly normal   Breasts: Examined lying and  sitting. pendulous    Right: Without masses, retractions, discharge or axillary adenopathy, large.     Left: Without masses, retractions, discharge or axillary adenopathy, large. Gentitourinary   Inguinal/mons:  Normal without inguinal adenopathy  External genitalia:  Normal  BUS/Urethra/Skene's glands:  Normal  Vagina:  Normal  Cervix:  Normal  Uterus:  normal in size, shape and contour.  Midline and mobile  Adnexa/parametria:     Rt: Without masses or tenderness.   Lt: Without masses or tenderness.  Anus and perineum: Normal  Digital rectal exam: Normal sphincter tone without palpated masses or tenderness  Assessment/Plan:  48 y.o. MWF G2P2 presents for annual exam with heavy periods.  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  Menorrhagia reviewed, questions if she had an ablation in the past in 2009 chances.  Options reviewed we will schedule sonohysterogram with Dr. Dellis Filbert and possible ablation after.  Has anxiety, prescription for Valium 5 mg to take prior to office appointment for sonohysterogram, reviewed best not to drive.  Denies need for STD testing at this time.  Will continue to see GI specialist and primary care to follow-up with anemia and GI problems. Discussed weight loss diet and exercise routine.  Pap with HPV testing.  SBEs, continue yearly mammograms.  Encouraged to continue self breast exams, instructed to vitamin D, iron pill,  screening guidelines reviewed.  Huel Cote [facility], 10:57  AM 03/03/2018

## 2018-03-03 NOTE — Patient Instructions (Addendum)
Health Maintenance, Female Adopting a healthy lifestyle and getting preventive care can go a long way to promote health and wellness. Talk with your health care provider about what schedule of regular examinations is right for you. This is a good chance for you to check in with your provider about disease prevention and staying healthy. In between checkups, there are plenty of things you can do on your own. Experts have done a lot of research about which lifestyle changes and preventive measures are most likely to keep you healthy. Ask your health care provider for more information. Weight and diet Eat a healthy diet  Be sure to include plenty of vegetables, fruits, low-fat dairy products, and lean protein.  Do not eat a lot of foods high in solid fats, added sugars, or salt.  Get regular exercise. This is one of the most important things you can do for your health. ? Most adults should exercise for at least 150 minutes each week. The exercise should increase your heart rate and make you sweat (moderate-intensity exercise). ? Most adults should also do strengthening exercises at least twice a week. This is in addition to the moderate-intensity exercise. Maintain a healthy weight  Body mass index (BMI) is a measurement that can be used to identify possible weight problems. It estimates body fat based on height and weight. Your health care provider can help determine your BMI and help you achieve or maintain a healthy weight.  For females 20 years of age and older: ? A BMI below 18.5 is considered underweight. ? A BMI of 18.5 to 24.9 is normal. ? A BMI of 25 to 29.9 is considered overweight. ? A BMI of 30 and above is considered obese. Watch levels of cholesterol and blood lipids  You should start having your blood tested for lipids and cholesterol at 48 years of age, then have this test every 5 years.  You may need to have your cholesterol levels checked more often if: ? Your lipid or  cholesterol levels are high. ? You are older than 48 years of age. ? You are at high risk for heart disease. Cancer screening Lung Cancer  Lung cancer screening is recommended for adults 55-80 years old who are at high risk for lung cancer because of a history of smoking.  A yearly low-dose CT scan of the lungs is recommended for people who: ? Currently smoke. ? Have quit within the past 15 years. ? Have at least a 30-pack-year history of smoking. A pack year is smoking an average of one pack of cigarettes a day for 1 year.  Yearly screening should continue until it has been 15 years since you quit.  Yearly screening should stop if you develop a health problem that would prevent you from having lung cancer treatment. Breast Cancer  Practice breast self-awareness. This means understanding how your breasts normally appear and feel.  It also means doing regular breast self-exams. Let your health care provider know about any changes, no matter how small.  If you are in your 20s or 30s, you should have a clinical breast exam (CBE) by a health care provider every 1-3 years as part of a regular health exam.  If you are 40 or older, have a CBE every year. Also consider having a breast X-ray (mammogram) every year.  If you have a family history of breast cancer, talk to your health care provider about genetic screening.  If you are at high risk for breast cancer, talk   to your health care provider about having an MRI and a mammogram every year.  Breast cancer gene (BRCA) assessment is recommended for women who have family members with BRCA-related cancers. BRCA-related cancers include: ? Breast. ? Ovarian. ? Tubal. ? Peritoneal cancers.  Results of the assessment will determine the need for genetic counseling and BRCA1 and BRCA2 testing. Cervical Cancer Your health care provider may recommend that you be screened regularly for cancer of the pelvic organs (ovaries, uterus, and vagina).  This screening involves a pelvic examination, including checking for microscopic changes to the surface of your cervix (Pap test). You may be encouraged to have this screening done every 3 years, beginning at age 21.  For women ages 30-65, health care providers may recommend pelvic exams and Pap testing every 3 years, or they may recommend the Pap and pelvic exam, combined with testing for human papilloma virus (HPV), every 5 years. Some types of HPV increase your risk of cervical cancer. Testing for HPV may also be done on women of any age with unclear Pap test results.  Other health care providers may not recommend any screening for nonpregnant women who are considered low risk for pelvic cancer and who do not have symptoms. Ask your health care provider if a screening pelvic exam is right for you.  If you have had past treatment for cervical cancer or a condition that could lead to cancer, you need Pap tests and screening for cancer for at least 20 years after your treatment. If Pap tests have been discontinued, your risk factors (such as having a new sexual partner) need to be reassessed to determine if screening should resume. Some women have medical problems that increase the chance of getting cervical cancer. In these cases, your health care provider may recommend more frequent screening and Pap tests. Colorectal Cancer  This type of cancer can be detected and often prevented.  Routine colorectal cancer screening usually begins at 48 years of age and continues through 48 years of age.  Your health care provider may recommend screening at an earlier age if you have risk factors for colon cancer.  Your health care provider may also recommend using home test kits to check for hidden blood in the stool.  A small camera at the end of a tube can be used to examine your colon directly (sigmoidoscopy or colonoscopy). This is done to check for the earliest forms of colorectal cancer.  Routine  screening usually begins at age 50.  Direct examination of the colon should be repeated every 5-10 years through 48 years of age. However, you may need to be screened more often if early forms of precancerous polyps or small growths are found. Skin Cancer  Check your skin from head to toe regularly.  Tell your health care provider about any new moles or changes in moles, especially if there is a change in a mole's shape or color.  Also tell your health care provider if you have a mole that is larger than the size of a pencil eraser.  Always use sunscreen. Apply sunscreen liberally and repeatedly throughout the day.  Protect yourself by wearing long sleeves, pants, a wide-brimmed hat, and sunglasses whenever you are outside. Heart disease, diabetes, and high blood pressure  High blood pressure causes heart disease and increases the risk of stroke. High blood pressure is more likely to develop in: ? People who have blood pressure in the high end of the normal range (130-139/85-89 mm Hg). ? People   who are overweight or obese. ? People who are African American.  If you are 84-22 years of age, have your blood pressure checked every 3-5 years. If you are 67 years of age or older, have your blood pressure checked every year. You should have your blood pressure measured twice-once when you are at a hospital or clinic, and once when you are not at a hospital or clinic. Record the average of the two measurements. To check your blood pressure when you are not at a hospital or clinic, you can use: ? An automated blood pressure machine at a pharmacy. ? A home blood pressure monitor.  If you are between 52 years and 3 years old, ask your health care provider if you should take aspirin to prevent strokes.  Have regular diabetes screenings. This involves taking a blood sample to check your fasting blood sugar level. ? If you are at a normal weight and have a low risk for diabetes, have this test once  every three years after 48 years of age. ? If you are overweight and have a high risk for diabetes, consider being tested at a younger age or more often. Preventing infection Hepatitis B  If you have a higher risk for hepatitis B, you should be screened for this virus. You are considered at high risk for hepatitis B if: ? You were born in a country where hepatitis B is common. Ask your health care provider which countries are considered high risk. ? Your parents were born in a high-risk country, and you have not been immunized against hepatitis B (hepatitis B vaccine). ? You have HIV or AIDS. ? You use needles to inject street drugs. ? You live with someone who has hepatitis B. ? You have had sex with someone who has hepatitis B. ? You get hemodialysis treatment. ? You take certain medicines for conditions, including cancer, organ transplantation, and autoimmune conditions. Hepatitis C  Blood testing is recommended for: ? Everyone born from [birth year] through 1965. ? Anyone with known risk factors for hepatitis C. Sexually transmitted infections (STIs)  You should be screened for sexually transmitted infections (STIs) including gonorrhea and chlamydia if: ? You are sexually active and are younger than 49 years of age. ? You are older than 48 years of age and your health care provider tells you that you are at risk for this type of infection. ? Your sexual activity has changed since you were last screened and you are at an increased risk for chlamydia or gonorrhea. Ask your health care provider if you are at risk.  If you do not have HIV, but are at risk, it may be recommended that you take a prescription medicine daily to prevent HIV infection. This is called pre-exposure prophylaxis (PrEP). You are considered at risk if: ? You are sexually active and do not regularly use condoms or know the HIV status of your partner(s). ? You take drugs by injection. ? You are sexually active with a partner  who has HIV. Talk with your health care provider about whether you are at high risk of being infected with HIV. If you choose to begin PrEP, you should first be tested for HIV. You should then be tested every 3 months for as long as you are taking PrEP. Pregnancy  If you are premenopausal and you may become pregnant, ask your health care provider about preconception counseling.  If you may become pregnant, take 400 to 800 micrograms (mcg) of folic acid every  day.  If you want to prevent pregnancy, talk to your health care provider about birth control (contraception). Osteoporosis and menopause  Osteoporosis is a disease in which the bones lose minerals and strength with aging. This can result in serious bone fractures. Your risk for osteoporosis can be identified using a bone density scan.  If you are 73 years of age or older, or if you are at risk for osteoporosis and fractures, ask your health care provider if you should be screened.  Ask your health care provider whether you should take a calcium or vitamin D supplement to lower your risk for osteoporosis.  Menopause may have certain physical symptoms and risks.  Hormone replacement therapy may reduce some of these symptoms and risks. Talk to your health care provider about whether hormone replacement therapy is right for you. Follow these instructions at home:  Schedule regular health, dental, and eye exams.  Stay current with your immunizations.  Do not use any tobacco products including cigarettes, chewing tobacco, or electronic cigarettes.  If you are pregnant, do not drink alcohol.  If you are breastfeeding, limit how much and how often you drink alcohol.  Limit alcohol intake to no more than 1 drink per day for nonpregnant women. One drink equals 12 ounces of beer, 5 ounces of wine, or 1 ounces of hard liquor.  Do not use street drugs.  Do not share needles.  Ask your health care provider for help if you need support  or information about quitting drugs.  Tell your health care provider if you often feel depressed.  Tell your health care provider if you have ever been abused or do not feel safe at home. This information is not intended to replace advice given to you by your health care provider. Make sure you discuss any questions you have with your health care provider. Document Released: 08/26/2010 Document Revised: 07/19/2015 Document Reviewed: 11/14/2014 Elsevier Interactive Patient Education  2019 [place] is a procedure to examine the inside of the uterus. This exam uses sound waves that are sent to a computer to make images of the lining of the uterus (endometrium). To get the best images, a germ-free, salt-water solution (sterile saline) is put into the uterus through the vagina. You may have this procedure if you have certain reproductive problems, such as abnormal bleeding, infertility, or miscarriage. This procedure can show what may be causing these problems. Possible causes include scarring or abnormal growths such as fibroids inside your uterus. It can also show if your uterus is an abnormal shape or if the lining of the uterus is too thin. Tell a health care provider about:  All medicines you are taking, including vitamins, herbs, eye drops, creams, and over-the-counter medicines.  Any allergies you have.  Any blood disorders you have.  Any surgeries you have had.  Any medical conditions you have.  Whether you are pregnant or may be pregnant.  The date of the first day of your last period.  Any signs of infection, such as fever, pain in your lower abdomen, or abnormal discharge from your vagina. What are the risks? Generally, this is a safe procedure. However, problems may occur, including:  Abdominal pain or cramping.  Light bleeding (spotting).  Increased vaginal discharge.  Infection. What happens before the procedure?  Your  health care provider may have you take an over-the-counter pain medicine.  You may be given medicine to stop any abnormal bleeding.  You may be  given antibiotic medicine to help prevent infection.  You may be asked to take a pregnancy test. This is usually in the form of a urine test.  You may have a pelvic exam.  You will be asked to empty your bladder. What happens during the procedure?  You will lie down on the exam table with your feet in stirrups or with your knees bent and your feet flat on the table.  A slender, handheld device (transducer) will be lubricated and placed into your vagina.  The transducer will be positioned to send sound waves to your uterus. The sound waves are sent to a computer and are turned into images, which your health care provider sees during the procedure.  The transducer will be removed from your vagina.  An instrument will be inserted to widen the opening of your vagina (speculum).  A swab with germ-killing solution (antiseptic) will be used to clean the opening to your uterus (cervix).  A long, thin tube (catheter) will be placed through your cervix into your uterus.  The speculum will be removed.  The transducer will be placed back into your vagina to take more images.  Your uterus will be filled with a germ-free, salt-water solution (sterile saline) through the catheter. You may feel some cramping.  A fluid that contains air bubbles may be sent through the catheter to make it easier to see the fallopian tubes.  The transducer and catheter will be removed. The procedure may vary among health care providers and hospitals. What happens after the procedure?  It is up to you to get the results of your procedure. Ask your health care provider, or the department that is doing the procedure, when your results will be ready. Summary  A sonohysterogram is a procedure that creates images of the inside of the uterus.  The risks of this procedure  are very low. Most women experience cramping and spotting after the procedure.  You may need to have a pelvic exam and take a pregnancy test before this procedure. This procedure will not be done if you are pregnant or have an infection. This information is not intended to replace advice given to you by your health care provider. Make sure you discuss any questions you have with your health care provider. Document Released: 06/27/2013 Document Revised: 01/07/2016 Document Reviewed: 01/07/2016 Elsevier Interactive Patient Education  2019 [employer]  Menorrhagia  Menorrhagia is a condition in which menstrual periods are heavy or last longer than normal. With menorrhagia, most periods a woman has may cause enough blood loss and cramping that she becomes unable to take part in her usual activities. What are the causes? Common causes of this condition include: Noncancerous growths in the uterus (polyps or fibroids). An imbalance of the estrogen and progesterone hormones. One of the ovaries not releasing an egg during one or more months. A problem with the thyroid gland (hypothyroid). Side effects of having an intrauterine device (IUD). Side effects of some medicines, such as anti-inflammatory medicines or blood thinners. A bleeding disorder that stops the blood from clotting normally. In some cases, the cause of this condition is not known. What are the signs or symptoms? Symptoms of this condition include: Routinely having to change your pad or tampon every 1-2 hours because it is completely soaked. Needing to use pads and tampons at the same time because of heavy bleeding. Needing to wake up to change your pads or tampons during the night. Passing blood clots larger than 1 inch (  2.5 cm) in size. Having bleeding that lasts for more than 7 days. Having symptoms of low iron levels (anemia), such as tiredness, fatigue, or shortness of breath. How is this diagnosed? This condition may be  diagnosed based on: A physical exam. Your symptoms and menstrual history. Tests, such as: Blood tests to check if you are pregnant or have hormonal changes, a bleeding or thyroid disorder, anemia, or other problems. Pap test to check for cancerous changes, infections, or inflammation. Endometrial biopsy. This test involves removing a tissue sample from the lining of the uterus (endometrium) to be examined under a microscope. Pelvic ultrasound. This test uses sound waves to create images of your uterus, ovaries, and vagina. The images can show if you have fibroids or other growths. Hysteroscopy. For this test, a small telescope is used to look inside your uterus. How is this treated? Treatment may not be needed for this condition. If it is needed, the best treatment for you will depend on: Whether you need to prevent pregnancy. Your desire to have children in the future. The cause and severity of your bleeding. Your personal preference. Medicines are the first step in treatment. You may be treated with: Hormonal birth control methods. These treatments reduce bleeding during your menstrual period. They include: Birth control pills. Skin patch. Vaginal ring. Shots (injections) that you get every 3 months. Hormonal IUD (intrauterine device). Implants that go under the skin. Medicines that thicken blood and slow bleeding. Medicines that reduce swelling, such as ibuprofen. Medicines that contain an artificial (synthetic) hormone called progestin. Medicines that make the ovaries stop working for a short time. Iron supplements to treat anemia. If medicines do not work, surgery may be done. Surgical options may include: Dilation and curettage (D&C). In this procedure, your health care provider opens (dilates) your cervix and then scrapes or suctions tissue from the endometrium to reduce menstrual bleeding. Operative hysteroscopy. In this procedure, a small tube with a light on the end  (hysteroscope) is used to view your uterus and help remove polyps that may be causing heavy periods. Endometrial ablation. This is when various techniques are used to permanently destroy your entire endometrium. After endometrial ablation, most women have little or no menstrual flow. This procedure reduces your ability to become pregnant. Endometrial resection. In this procedure, an electrosurgical wire loop is used to remove the endometrium. This procedure reduces your ability to become pregnant. Hysterectomy. This is surgical removal of the uterus. This is a permanent procedure that stops menstrual periods. Pregnancy is not possible after a hysterectomy. Follow these instructions at home: Medicines Take over-the-counter and prescription medicines exactly as told by your health care provider. This includes iron pills. Do not change or switch medicines without asking your health care provider. Do not take aspirin or medicines that contain aspirin 1 week before or during your menstrual period. Aspirin may make bleeding worse. General instructions If you need to change your sanitary pad or tampon more than once every 2 hours, limit your activity until the bleeding stops. Iron pills can cause constipation. To prevent or treat constipation while you are taking prescription iron supplements, your health care provider may recommend that you: Drink enough fluid to keep your urine clear or pale yellow. Take over-the-counter or prescription medicines. Eat foods that are high in fiber, such as fresh fruits and vegetables, whole grains, and beans. Limit foods that are high in fat and processed sugars, such as fried and sweet foods. Eat well-balanced meals, including foods that are  high in iron. Foods that have a lot of iron include leafy green vegetables, meat, liver, eggs, and whole grain breads and cereals. Do not try to lose weight until the abnormal bleeding has stopped and your blood iron level is back to  normal. If you need to lose weight, work with your health care provider to lose weight safely. Keep all follow-up visits as told by your health care provider. This is important. Contact a health care provider if: You soak through a pad or tampon every 1 or 2 hours, and this happens every time you have a period. You need to use pads and tampons at the same time because you are bleeding so much. You have nausea, vomiting, diarrhea, or other problems related to medicines you are taking. Get help right away if: You soak through more than a pad or tampon in 1 hour. You pass clots bigger than 1 inch (2.5 cm) wide. You feel short of breath. You feel like your heart is beating too fast. You feel dizzy or faint. You feel very weak or tired. Summary Menorrhagia is a condition in which menstrual periods are heavy or last longer than normal. Treatment will depend on the cause of the condition and may include medicines or procedures. Take over-the-counter and prescription medicines exactly as told by your health care provider. This includes iron pills. Get help right away if you have heavy bleeding that soaks through more than a pad or tampon in 1 hour, you are passing large clots, or you feel dizzy, faint or short of breath. This information is not intended to replace advice given to you by your health care provider. Make sure you discuss any questions you have with your health care provider. Document Released: 02/10/2005 Document Revised: 02/04/2016 Document Reviewed: 02/04/2016 Elsevier Interactive Patient Education  2019 [employer].

## 2018-03-04 LAB — PAP, TP IMAGING W/ HPV RNA, RFLX HPV TYPE 16,18/45: HPV DNA High Risk: NOT DETECTED

## 2018-03-05 ENCOUNTER — Ambulatory Visit (INDEPENDENT_AMBULATORY_CARE_PROVIDER_SITE_OTHER): Payer: 59 | Admitting: Plastic Surgery

## 2018-03-05 ENCOUNTER — Encounter: Payer: Self-pay | Admitting: Plastic Surgery

## 2018-03-05 ENCOUNTER — Telehealth: Payer: Self-pay

## 2018-03-05 VITALS — BP 126/87 | HR 66 | Temp 98.4°F | Ht 62.0 in | Wt 192.0 lb

## 2018-03-05 DIAGNOSIS — G8929 Other chronic pain: Secondary | ICD-10-CM

## 2018-03-05 DIAGNOSIS — M549 Dorsalgia, unspecified: Secondary | ICD-10-CM | POA: Insufficient documentation

## 2018-03-05 DIAGNOSIS — N62 Hypertrophy of breast: Secondary | ICD-10-CM | POA: Diagnosis not present

## 2018-03-05 DIAGNOSIS — M542 Cervicalgia: Secondary | ICD-10-CM | POA: Diagnosis not present

## 2018-03-05 DIAGNOSIS — M546 Pain in thoracic spine: Secondary | ICD-10-CM

## 2018-03-05 DIAGNOSIS — F418 Other specified anxiety disorders: Secondary | ICD-10-CM | POA: Diagnosis not present

## 2018-03-05 HISTORY — DX: Dorsalgia, unspecified: M54.9

## 2018-03-05 NOTE — Telephone Encounter (Signed)
-----   Message from Huel Cote, NP sent at 03/04/2018  3:50 PM EST ----- Please call and review pap normal but suggestive of BV, flagyl 500mg  bid for 7 days, avoid alcohol while taking

## 2018-03-05 NOTE — Progress Notes (Addendum)
Patient ID: Rebecca Morrison, female    DOB: [birth year], 49 y.o.   MRN: [id number]   Chief Complaint  Patient presents with  . Advice Only    regarding breast reduction    Mammary Hyperplasia: The patient is a 48 y.o. female with a history of mammary hyperplasia for several years.  She has extremely large breasts causing symptoms that include the following: Back pain (upper and lower) and neck pain. She frequently pins bra cups higher on straps for better lift and relief. Notices relief when holding breast up in her hands. Shoulder straps causing grooves, pain occasionally requiring padding. Pain medication is sometimes required with motrin and tylenol.  Activities that are hindered by enlarged breasts include: Running and exercise.  Her breasts are extremely large and fairly symmetric.  She has hyperpigmentation of the inframammary area on both sides.  The sternal to nipple distance on the right is 32 cm and the left is 32 cm.  The IMF distance is 17 cm.  She is 5 feet 2 inches tall and weighs 192 pounds.  Preoperative bra size = 20F cup.  The estimated excess breast tissue to be removed at the time of surgery = 700 grams on the left and 700 grams on the right.  Mammogram history: 2019 negative   Patient states that she has seen multiple plastic surgeons for this condition and has not gone the surgery as of yet.  Mostly it spent for this and fearful point the pain has continued which has brought her to the state of being ready for the surgery.    Review of Systems  Constitutional: Positive for activity change. Negative for appetite change.  HENT: Negative.   Eyes: Negative.   Respiratory: Negative.   Cardiovascular: Negative.  Negative for leg swelling.  Gastrointestinal: Negative.   Endocrine: Negative.   Genitourinary: Negative.   Musculoskeletal: Positive for back pain and neck pain.  Skin: Negative.   Neurological: Negative.   Psychiatric/Behavioral: Negative.     Past Medical  History:  Diagnosis Date  . Allergy   . Anemia    pt states iron deficient  . Cyst of right breast 06/19/2014   US/mamm, confirmed cyst 29mm  . GERD (gastroesophageal reflux disease)   . Hiatal hernia    3/1//2016 Dr. Gala Lewandowsky  . Menorrhagia   . Murmur    Patient states echocardiogram was normal  . Tinnitus    ENT evaluated    Past Surgical History:  Procedure Laterality Date  . COLONOSCOPY  07/10/2011   rectal bleeding, FHX. Normal colonoscopy.   . ESOPHAGOGASTRODUODENOSCOPY  03/27/2013   hiatal hernia/GERD      Current Outpatient Medications:  .  Cholecalciferol 25 MCG (1000 UT) tablet, Take 1 tablet (1,000 Units total) by mouth daily., Disp: 90 tablet, Rfl: 3 .  diazepam (VALIUM) 5 MG tablet, Take prior to procedures, Disp: 10 tablet, Rfl: 0 .  famotidine (PEPCID) 20 MG tablet, Take 1 tablet by mouth daily as needed., Disp: , Rfl:  .  IRON PO, Take 1 tablet by mouth daily., Disp: , Rfl:  .  sucralfate (CARAFATE) 1 g tablet, Take 1 tablet (1 g total) by mouth 4 (four) times daily -  with meals and at bedtime., Disp: 120 tablet, Rfl: 0 .  vitamin B-12 (V-R VITAMIN B-12) 500 MCG tablet, Take 1 tablet (500 mcg total) by mouth daily., Disp: 90 tablet, Rfl: 3   Objective:   Vitals:   03/05/18 1022  BP: 126/87  Pulse: 66  Temp: 98.4 F (36.9 C)  SpO2: 98%    Physical Exam Vitals signs and nursing note reviewed.  Constitutional:      Appearance: Normal appearance.  HENT:     Head: Normocephalic and atraumatic.     Mouth/Throat:     Mouth: Mucous membranes are moist.  Eyes:     Extraocular Movements: Extraocular movements intact.  Cardiovascular:     Rate and Rhythm: Normal rate.     Pulses: Normal pulses.  Pulmonary:     Effort: Pulmonary effort is normal.  Abdominal:     General: Abdomen is flat. There is no distension.     Tenderness: There is no abdominal tenderness.  Skin:    General: Skin is warm.     Coloration: Skin is not jaundiced.     Findings: No  bruising.  Neurological:     General: No focal deficit present.     Mental Status: She is alert.  Psychiatric:        Mood and Affect: Mood normal.        Thought Content: Thought content normal.        Judgment: Judgment normal.     Assessment & Plan:  Depression with anxiety  Chronic bilateral thoracic back pain  Neck pain  Symptomatic mammary hypertrophy  Recommend bilateral breast reduction.  We discussed the risks and complications, possible decreased ability with breast-feeding in the future and sensation changes.  I expressed concern about patient expectation.  And she acknowledged they were high.  I reviewed the mammogram report from September reviewed the mammogram report from September 2019.   [place], DO

## 2018-03-05 NOTE — Telephone Encounter (Signed)
I advised patient of bacteria on Pap and that you recommended Metronidazole Rx. She said she is having some serious stomach issues and may have ulcer, having to have a colonoscopy. She asked if perhaps you could send a vaginal cream or something that is not oral that might make her stomach issues worse.  She is having no symptoms at all.

## 2018-03-05 NOTE — Telephone Encounter (Signed)
Patient informed. 

## 2018-03-05 NOTE — Telephone Encounter (Signed)
Ok, since having no symptoms, lets watch at this time .  Have her call if irritation, odor or change in discharge.

## 2018-03-17 DIAGNOSIS — Z800 Family history of malignant neoplasm of digestive organs: Secondary | ICD-10-CM | POA: Diagnosis not present

## 2018-03-17 DIAGNOSIS — K648 Other hemorrhoids: Secondary | ICD-10-CM | POA: Diagnosis not present

## 2018-03-17 DIAGNOSIS — Z1211 Encounter for screening for malignant neoplasm of colon: Secondary | ICD-10-CM | POA: Diagnosis not present

## 2018-03-24 LAB — HM COLONOSCOPY

## 2018-03-31 ENCOUNTER — Other Ambulatory Visit: Payer: Self-pay | Admitting: Obstetrics & Gynecology

## 2018-03-31 DIAGNOSIS — N939 Abnormal uterine and vaginal bleeding, unspecified: Secondary | ICD-10-CM

## 2018-04-01 ENCOUNTER — Encounter: Payer: Self-pay | Admitting: Family Medicine

## 2018-04-07 ENCOUNTER — Ambulatory Visit: Payer: 59 | Admitting: Obstetrics & Gynecology

## 2018-04-07 ENCOUNTER — Other Ambulatory Visit: Payer: 59

## 2018-04-11 ENCOUNTER — Other Ambulatory Visit: Payer: Self-pay | Admitting: Family Medicine

## 2018-05-17 ENCOUNTER — Other Ambulatory Visit: Payer: Self-pay | Admitting: Family Medicine

## 2018-06-18 ENCOUNTER — Ambulatory Visit: Payer: Self-pay | Admitting: *Deleted

## 2018-06-18 NOTE — Telephone Encounter (Signed)
Please advise if we can schedule for virtual visit for ABD pain for this patient

## 2018-06-18 NOTE — Telephone Encounter (Signed)
Pt was called and given instructions. She verbalized understanding

## 2018-06-18 NOTE — Telephone Encounter (Signed)
Call to office to see if this is something that PCP would want to discuss with patient before sending her out - or even if she would see her for this in the office.  Notes requested for review and patient is aware she will get call back for advisement on next steps. Patient states she has history of gall bladder issues- she thinks this could possibly be flare- she has been eating a lot of salads and losing weight. Patient is aware that virtual visits are being done at this time- so PCP will have to decide the best way to evaluate her abdominal pain.  Reason for Disposition . [1] MILD-MODERATE pain AND [2] constant AND [3] present > 2 hours  Answer Assessment - Initial Assessment Questions 1. LOCATION: "Where does it hurt?"      R- sided pain- dull pain 2. RADIATION: "Does the pain shoot anywhere else?" (e.g., chest, back)     1 week ago her whole back hurt- her cycle was is now 4th day 3. ONSET: "When did the pain begin?" (e.g., minutes, hours or days ago)      Ongoing for few weeks- last night worse- was more constant and laying down made it worse 4. SUDDEN: "Gradual or sudden onset?"     gradula 5. PATTERN "Does the pain come and go, or is it constant?"    - If constant: "Is it getting better, staying the same, or worsening?"      (Note: Constant means the pain never goes away completely; most serious pain is constant and it progresses)     - If intermittent: "How long does it last?" "Do you have pain now?"     (Note: Intermittent means the pain goes away completely between bouts)     Constant day 3- dull- possibly more after eating- feels more at night 6. SEVERITY: "How bad is the pain?"  (e.g., Scale 1-10; mild, moderate, or severe)   - MILD (1-3): doesn't interfere with normal activities, abdomen soft and not tender to touch    - MODERATE (4-7): interferes with normal activities or awakens from sleep, tender to touch    - SEVERE (8-10): excruciating pain, doubled over, unable to do any  normal activities      moderate 7. RECURRENT SYMPTOM: "Have you ever had this type of abdominal pain before?" If so, ask: "When was the last time?" and "What happened that time?"      Yes- gallbladder related-? Feels same as last flare 8. CAUSE: "What do you think is causing the abdominal pain?"     Possible gallbladder related 9. RELIEVING/AGGRAVATING FACTORS: "What makes it better or worse?" (e.g., movement, antacids, bowel movement)     Alcohol-worse 10. OTHER SYMPTOMS: "Has there been any vomiting, diarrhea, constipation, or urine problems?"       Slightly nauseous last night 11. PREGNANCY: "Is there any chance you are pregnant?" "When was your last menstrual period?"       Cycle now  Protocols used: ABDOMINAL PAIN - [facility]

## 2018-06-18 NOTE — Telephone Encounter (Signed)
Please call pt: - given her symptoms and the fact they are worsening, she will need labs and imaging more urgently that we can provide outpatient. I would suggest she go to the [facility]. This would be a safer option COVID19 exposure  wise.

## 2018-11-10 ENCOUNTER — Encounter: Payer: Self-pay | Admitting: Family Medicine

## 2018-11-10 ENCOUNTER — Ambulatory Visit (INDEPENDENT_AMBULATORY_CARE_PROVIDER_SITE_OTHER): Payer: 59 | Admitting: Family Medicine

## 2018-11-10 ENCOUNTER — Other Ambulatory Visit: Payer: Self-pay

## 2018-11-10 VITALS — BP 136/83 | HR 63 | Temp 98.4°F | Resp 16 | Ht 62.0 in | Wt 170.1 lb

## 2018-11-10 DIAGNOSIS — N62 Hypertrophy of breast: Secondary | ICD-10-CM

## 2018-11-10 NOTE — Progress Notes (Signed)
Rebecca Morrison , 1970/06/08, 48 y.o., female MRN: [id number] Patient Care Team    Relationship Specialty Notifications Start End  [name], DO PCP - General Family Medicine  09/03/15     Chief Complaint  Patient presents with  . Referral    Pt wants breast reduction and needs referral that states health issues and  how the surgery will correct these      Subjective: Pt presents for an OV with request for referral for breast reduction.  She has seen a Psychiatric nurse who recommended she be seen by her primary care for a referral.  Patient has a history of macromastia which has significantly caused functional impairments.  She has hyperpigmentation and episodes of intertrigo beneath bilateral breasts.  She suffers from headaches, back pain, chest pain from the weight of her breasts, neck pain and shoulder pain.  It is extremely uncomfortable for her to wear normal overall secondary to the weight it places on her shoulders.  She has difficulty sleeping secondary to unable to be comfortable laying flat or on her side.  She is unable to sleep on her abdomen.  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  She is no longer able to run or play sports, such as golf, secondary to discomfort.  She endorses numbness and tingling in her hands and fingers with activity.  Depression screen [facility] 2/9 01/18/2018 01/18/2018 03/27/2017 07/07/2016 10/30/2015  Decreased Interest 1 0 0 0 1  Down, Depressed, Hopeless 1 0 0 0 2  PHQ - 2 Score 2 0 0 0 3  Altered sleeping 3 - - - 1  Tired, decreased energy 3 - - - 3  Change in appetite 3 - - - 3  Feeling bad or failure about yourself  1 - - - 2  Trouble concentrating 1 - - - 1  Moving slowly or fidgety/restless 3 - - - 1  Suicidal thoughts 0 - - - 0  PHQ-9 Score 16 - - - 14  Difficult doing work/chores Somewhat difficult - - - -     No Known Allergies Social History   Social History Narrative   Married, husband Psychologist, clinical. 2 children [name] in [place].   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   Former smoker, quit 2003.   Social alcohol use, no drug use.   Drinks caffeinated beverages, uses herbal remedies, takes a daily vitamin.   Wears her seatbelt, wears a bicycle helmet, exercises at least 3 times a week.   Smoke detector in the home, feels safe in her relationships.   Past Medical History:  Diagnosis Date  . Allergy   . Anemia    pt states iron deficient  . Cyst of right breast 06/19/2014   US/mamm, confirmed cyst 23mm  . GERD (gastroesophageal reflux disease)   . Hiatal hernia    3/1//2016 Dr. Gala Lewandowsky  . Menorrhagia   . Murmur    Patient states echocardiogram was normal  . Tinnitus    ENT evaluated   Past Surgical History:  Procedure Laterality Date  . COLONOSCOPY  07/10/2011   rectal bleeding, FHX. Normal colonoscopy.   . ESOPHAGOGASTRODUODENOSCOPY  03/27/2013   hiatal hernia/GERD   Family History  Problem Relation Age of Onset  . Arthritis Mother   . Osteoporosis Mother        "severe"  .  Cancer Sister   . Kidney cancer Sister   . Colon cancer Maternal Grandmother   . Ovarian cancer Maternal Grandmother   . Bone cancer Maternal Grandfather   . Heart disease Maternal Grandfather   . Breast cancer Paternal Grandmother    Allergies as of 11/10/2018   No Known Allergies     Medication List       Accurate as of November 10, 2018  9:43 AM. If you have any questions, ask your nurse or doctor.        Cholecalciferol 25 MCG (1000 UT) tablet Take 1 tablet (1,000 Units total) by mouth daily.   diazepam 5 MG tablet Commonly known as: Valium Take prior to procedures   Fish Oil 1000 MG Caps Take 1 capsule by mouth daily.   GLUCOSAMINE CHONDR 500 COMPLEX PO Take 1 tablet by mouth daily.   ibuprofen 600 MG tablet Commonly known as: ADVIL Take 600  mg by mouth every 6 (six) hours as needed.   IRON PO Take 1 tablet by mouth daily.   Pepcid 20 MG tablet Generic drug: famotidine Take 1 tablet by mouth daily as needed.   sucralfate 1 g tablet Commonly known as: Carafate Take 1 tablet (1 g total) by mouth 4 (four) times daily -  with meals and at bedtime.   vitamin B-12 500 MCG tablet Commonly known as: V-R VITAMIN B-12 Take 1 tablet (500 mcg total) by mouth daily.   Zinc 15 MG Caps Take 2 capsules by mouth daily.       All past medical history, surgical history, allergies, family history, immunizations andmedications were updated in the EMR today and reviewed under the history and medication portions of their EMR.     ROS: Negative, with the exception of above mentioned in HPI   Objective:  BP 136/83 (BP Location: Right Arm, Patient Position: Sitting, Cuff Size: Normal)   Pulse 63   Temp 98.4 F (36.9 C) (Temporal)   Resp 16   Ht 5\' 2"  (1.575 m)   Wt 170 lb 2 oz (77.2 kg)   LMP 10/27/2018 (Exact Date)   SpO2 100%   BMI 31.12 kg/m  Body mass index is 31.12 kg/m. Gen: Afebrile. No acute distress. Nontoxic in appearance, well developed, well nourished.  HENT: AT. [place].  Eyes:Pupils Equal Round Reactive to light, Extraocular movements intact,  Conjunctiva without redness, discharge or icterus. CV: RRR no murmur Chest: CTAB, no wheeze or crackles. Good air movement, normal resp effort.  MSK: Bilateral redness and bruise of shoulders from bra strap.  Thoracic kyphosis present. Breast: Large pendulous breast.  Hyperpigmentation present below bilateral breasts. Neuro: Normal gait.  Alert. Oriented x3    No exam data present No results found. No results found for this or any previous visit (from the past 24 hour(s)).  Assessment/Plan: Rebecca Morrison is a 48 y.o. female present for OV for  Macromastia Patient has rather large pendulous breast/macromastia, bra size 50F.  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  She has skin changes, headaches, paresthesias of her fingers and hands, neck pain, shoulder pain, thoracic back pain, chest wall pain and  Kyphosis, all of which are attributed to her macromastia. -Patient would benefit from her breast reduction and the proposed procedure is likely to result in significant improvement of her functional impairments.    Reviewed expectations re: course of  current medical issues.  Discussed self-management of symptoms.  Outlined signs and symptoms indicating need for more acute intervention.  Patient verbalized understanding and all questions were answered.  Patient received an After-Visit Summary.    No orders of the defined types were placed in this encounter.  > 25 minutes spent with patient, >50% of time spent face to face     Note is dictated utilizing voice recognition software. Although note has been proof read prior to signing, occasional typographical errors still can be missed. If any questions arise, please do not hesitate to call for verification.   electronically signed by:  Howard Pouch, DO  [place]

## 2018-11-10 NOTE — Patient Instructions (Signed)
We will complete a note and send to your surgeon- coverage will depend on your insurance.

## 2018-11-11 ENCOUNTER — Telehealth: Payer: Self-pay | Admitting: Family Medicine

## 2018-11-11 ENCOUNTER — Encounter: Payer: Self-pay | Admitting: Family Medicine

## 2018-11-11 NOTE — Telephone Encounter (Signed)
Office visit note for referral for macromastia has been completed.  Please make patient aware and forward to her plastic surgeon Dr. Audelia Hives.   If patient needs note sent to another plastic surgeon or a referral placed in the system, for her insurance purposes.  She will need to call us back and let us know.

## 2018-11-12 NOTE — Telephone Encounter (Signed)
Pt was called and given information. OV faxed to plastic surgeon.

## 2018-12-02 ENCOUNTER — Telehealth: Payer: Self-pay | Admitting: Plastic Surgery

## 2018-12-02 NOTE — Telephone Encounter (Signed)
Left message for patient to return call on 11/29/18 and 12/01/18. If patient calls back, we are ready to schedule surgery. New authorization has been obtained.

## 2018-12-09 ENCOUNTER — Telehealth: Payer: Self-pay | Admitting: Plastic Surgery

## 2018-12-09 NOTE — Telephone Encounter (Signed)
LVM for patient or spouse  to return call regarding surgery auth.

## 2018-12-10 ENCOUNTER — Telehealth: Payer: Self-pay | Admitting: Plastic Surgery

## 2018-12-10 NOTE — Telephone Encounter (Signed)
Multiple attempts to contact patient to schedule surgery, but we have not heard back from the patient as of yet. We are sending a letter to the patient to contact our office if she is still interested in scheduling surgery.

## 2018-12-16 ENCOUNTER — Telehealth: Payer: Self-pay | Admitting: Plastic Surgery

## 2018-12-16 NOTE — Telephone Encounter (Signed)
Message received from [place] at the front desk: Patient is moving and does not want surgery. Has requested for [place] to not contact her or her husband, as she is no longer interested in scheduling surgery.

## 2018-12-28 NOTE — Telephone Encounter (Signed)
Tried to call patient to see what letter she is needing. Faxed recent OV note in regards to plastic surgery to fax # provided

## 2018-12-28 NOTE — Telephone Encounter (Signed)
Patient needs another letter sent to a plastic surgeon regarding breast reduction.  Dr. Earlie Counts Plastic Surgery Attn: Anderson [place] Fax #  [phone]  Patient can be reached at [phone].  Thank you

## 2019-01-07 ENCOUNTER — Ambulatory Visit: Payer: 59 | Admitting: Family Medicine

## 2019-01-10 ENCOUNTER — Encounter: Payer: Self-pay | Admitting: Family Medicine

## 2019-01-10 ENCOUNTER — Ambulatory Visit (INDEPENDENT_AMBULATORY_CARE_PROVIDER_SITE_OTHER): Payer: 59 | Admitting: Family Medicine

## 2019-01-10 ENCOUNTER — Other Ambulatory Visit: Payer: Self-pay

## 2019-01-10 ENCOUNTER — Ambulatory Visit (INDEPENDENT_AMBULATORY_CARE_PROVIDER_SITE_OTHER): Payer: 59

## 2019-01-10 VITALS — BP 125/86 | HR 67 | Temp 97.9°F | Resp 17 | Ht 62.0 in | Wt 188.2 lb

## 2019-01-10 DIAGNOSIS — M542 Cervicalgia: Secondary | ICD-10-CM

## 2019-01-10 DIAGNOSIS — M546 Pain in thoracic spine: Secondary | ICD-10-CM

## 2019-01-10 DIAGNOSIS — M2550 Pain in unspecified joint: Secondary | ICD-10-CM | POA: Diagnosis not present

## 2019-01-10 DIAGNOSIS — E559 Vitamin D deficiency, unspecified: Secondary | ICD-10-CM

## 2019-01-10 DIAGNOSIS — D649 Anemia, unspecified: Secondary | ICD-10-CM | POA: Diagnosis not present

## 2019-01-10 DIAGNOSIS — M25562 Pain in left knee: Secondary | ICD-10-CM

## 2019-01-10 DIAGNOSIS — M25561 Pain in right knee: Secondary | ICD-10-CM

## 2019-01-10 DIAGNOSIS — G8929 Other chronic pain: Secondary | ICD-10-CM

## 2019-01-10 DIAGNOSIS — E611 Iron deficiency: Secondary | ICD-10-CM

## 2019-01-10 DIAGNOSIS — M25559 Pain in unspecified hip: Secondary | ICD-10-CM

## 2019-01-10 HISTORY — DX: Other chronic pain: G89.29

## 2019-01-10 LAB — C-REACTIVE PROTEIN: CRP: <1 mg/dL (ref 0.5–20.0)

## 2019-01-10 LAB — CBC WITH DIFFERENTIAL/PLATELET
Basophils Absolute: 0 10*3/uL (ref 0.0–0.1)
Basophils Relative: 1.1 % (ref 0.0–3.0)
Eosinophils Absolute: 0.3 10*3/uL (ref 0.0–0.7)
Eosinophils Relative: 6.9 % — ABNORMAL HIGH (ref 0.0–5.0)
HCT: 40.2 % (ref 36.0–46.0)
Hemoglobin: 13.4 g/dL (ref 12.0–15.0)
Lymphocytes Relative: 30 % (ref 12.0–46.0)
Lymphs Abs: 1.2 10*3/uL (ref 0.7–4.0)
MCHC: 33.3 g/dL (ref 30.0–36.0)
MCV: 91.8 fl (ref 78.0–100.0)
Monocytes Absolute: 0.3 10*3/uL (ref 0.1–1.0)
Monocytes Relative: 7 % (ref 3.0–12.0)
Neutro Abs: 2.3 10*3/uL (ref 1.4–7.7)
Neutrophils Relative %: 55 % (ref 43.0–77.0)
Platelets: 113 10*3/uL — ABNORMAL LOW (ref 150.0–400.0)
RBC: 4.38 Mil/uL (ref 3.87–5.11)
RDW: 14.2 % (ref 11.5–15.5)
WBC: 4.1 10*3/uL (ref 4.0–10.5)

## 2019-01-10 LAB — SEDIMENTATION RATE: Sed Rate: 3 mm/hr (ref 0–20)

## 2019-01-10 LAB — BASIC METABOLIC PANEL
BUN: 10 mg/dL (ref 6–23)
CO2: 26 mEq/L (ref 19–32)
Calcium: 9.8 mg/dL (ref 8.4–10.5)
Chloride: 103 mEq/L (ref 96–112)
Creatinine, Ser: 0.8 mg/dL (ref 0.40–1.20)
GFR: 76.48 mL/min (ref >60.00)
Glucose, Bld: 98 mg/dL (ref 70–99)
Potassium: 4.3 mEq/L (ref 3.5–5.1)
Sodium: 138 mEq/L (ref 135–145)

## 2019-01-10 LAB — TSH: TSH: 1.45 u[IU]/mL (ref 0.35–4.50)

## 2019-01-10 LAB — VITAMIN D 25 HYDROXY (VIT D DEFICIENCY, FRACTURES): VITD: 31.89 ng/mL (ref 30.00–100.00)

## 2019-01-10 NOTE — Patient Instructions (Signed)
We will call you when we get all the labs back and your xray results.    Arthritis Arthritis means joint pain. It can also mean joint disease. A joint is a place where bones come together. There are more than 100 types of arthritis. What are the causes? This condition may be caused by:  Wear and tear of a joint. This is the most common cause.  A lot of acid in the blood, which leads to pain in the joint (gout).  Pain and swelling (inflammation) in a joint.  Infection of a joint.  Injuries in the joint.  A reaction to medicines (allergy). In some cases, the cause may not be known. What are the signs or symptoms? Symptoms of this condition include:  Redness at a joint.  Swelling at a joint.  Stiffness at a joint.  Warmth coming from the joint.  A fever.  A feeling of being sick. How is this treated? This condition may be treated with:  Treating the cause, if it is known.  Rest.  Raising (elevating) the joint.  Putting cold or hot packs on the joint.  Medicines to treat symptoms and reduce pain and swelling.  Shots of medicines (cortisone) into the joint. You may also be told to make changes in your life, such as doing exercises and losing weight. Follow these instructions at home: Medicines  Take over-the-counter and prescription medicines only as told by your doctor.  Do not take aspirin for pain if your doctor says that you may have gout. Activity  Rest your joint if your doctor tells you to.  Avoid activities that make the pain worse.  Exercise your joint regularly as told by your doctor. Try doing exercises like: ? Swimming. ? Water aerobics. ? Biking. ? Walking. Managing pain, stiffness, and swelling      If told, put ice on the affected area. ? Put ice in a plastic bag. ? Place a towel between your skin and the bag. ? Leave the ice on for 20 minutes, 2-3 times per day.  If your joint is swollen, raise (elevate) it above the level of your  heart if told by your doctor.  If your joint feels stiff in the morning, try taking a warm shower.  If told, put heat on the affected area. Do this as often as told by your doctor. Use the heat source that your doctor recommends, such as a moist heat pack or a heating pad. If you have diabetes, do not apply heat without asking your doctor. To apply heat: ? Place a towel between your skin and the heat source. ? Leave the heat on for 20-30 minutes. ? Remove the heat if your skin turns bright red. This is very important if you are unable to feel pain, heat, or cold. You may have a greater risk of getting burned. General instructions  Do not use any products that contain nicotine or tobacco, such as cigarettes, e-cigarettes, and chewing tobacco. If you need help quitting, ask your doctor.  Keep all follow-up visits as told by your doctor. This is important. Contact a doctor if:  The pain gets worse.  You have a fever. Get help right away if:  You have very bad pain in your joint.  You have swelling in your joint.  Your joint is red.  Many joints become painful and swollen.  You have very bad back pain.  Your leg is very weak.  You cannot control your pee (urine) or poop (  stool). Summary  Arthritis means joint pain. It can also mean joint disease. A joint is a place where bones come together.  The most common cause of this condition is wear and tear of a joint.  Symptoms of this condition include redness, swelling, or stiffness of the joint.  This condition is treated with rest, raising the joint, medicines, and putting cold or hot packs on the joint.  Follow your doctor's instructions about medicines, activity, exercises, and other home care treatments. This information is not intended to replace advice given to you by your health care provider. Make sure you discuss any questions you have with your health care provider. Document Released: 05/07/2009 Document Revised:  01/18/2018 Document Reviewed: 01/18/2018 Elsevier Patient Education  2020 [employer].

## 2019-01-10 NOTE — Progress Notes (Signed)
Rebecca Morrison , [birth year], 48 y.o., female MRN: [id number] Patient Care Team    Relationship Specialty Notifications Start End  Rebecca Hillock, DO PCP - General Family Medicine  09/03/15   Dillingham, Rebecca Lofty, DO Attending Physician Plastic Surgery  11/12/18     Chief Complaint  Patient presents with   Back Pain    Has been hurting for two years but has gotten worse over the past two months.Activities makes it worse.    Joint Pain     Subjective: Pt presents for an OV with complaints of back pain and joint pain worsening over last 2 months, present for a few years.  Patient reports her lower back bothers her midline and both sides of her lumbar spine and radiates to her buttocks.  She states the pain can hurt when sitting or transitioning positions.  She has noticed increased joint pain of her hips, knees, back and her feet.  She reports she has not been able to be active and walk as much as she did prior.  She reports walking can be excruciating and cause her to be uncomfortable for the rest of the day.  She also no longer uses her home gym because it hurts.  She reports the most significant pain is a sharp pain in her left hip which she states catch when she is walking at times and cause a very sharp deep pain in her hip joint.  She reports if that occurs she has had trouble even making it back home to finish her walk.  Pain is worse with certain movements with walking, although she is uncertain which movement triggers the sharp pain.  Is not always the same movement.  She does admit going down hill seems to be a more consistent trigger.  She has difficulty with stairs with both her hips and her knees.  She has tried soaking in warm Epson salt baths, using heating pads and ibuprofen.  She reports family history of arthritis in her mother, although she does not know what type.  And her maternal great grandmother had rheumatoid arthritis.  She is very frustrated, crying during appointment and  states she is just tired of feeling so uncomfortable all the time and wants to get back to her normal life.  She denies any injury to her hips or joints.  Depression screen [facility] 2/9 01/18/2018 01/18/2018 03/27/2017 07/07/2016 10/30/2015  Decreased Interest 1 0 0 0 1  Down, Depressed, Hopeless 1 0 0 0 2  PHQ - 2 Score 2 0 0 0 3  Altered sleeping 3 - - - 1  Tired, decreased energy 3 - - - 3  Change in appetite 3 - - - 3  Feeling bad or failure about yourself  1 - - - 2  Trouble concentrating 1 - - - 1  Moving slowly or fidgety/restless 3 - - - 1  Suicidal thoughts 0 - - - 0  PHQ-9 Score 16 - - - 14  Difficult doing work/chores Somewhat difficult - - - -    No Known Allergies Social History   Social History Narrative   Married, husband Psychologist, clinical. 2 children Rebecca Morrison in [place].   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   Former smoker, quit 2003.   Social alcohol use, no drug use.   Drinks caffeinated beverages, uses herbal remedies, takes a daily vitamin.   Wears her seatbelt, wears a bicycle helmet, exercises at least 3 times a week.  Smoke detector in the home, feels safe in her relationships.   Past Medical History:  Diagnosis Date   Allergy    Anemia    pt states iron deficient   Cyst of right breast 06/19/2014   US/mamm, confirmed cyst 75mm   GERD (gastroesophageal reflux disease)    Hiatal hernia    3/1//2016 Dr. Gala Lewandowsky   Menorrhagia    Murmur    Patient states echocardiogram was normal   Tinnitus    ENT evaluated   Past Surgical History:  Procedure Laterality Date   COLONOSCOPY  07/10/2011   rectal bleeding, FHX. Normal colonoscopy.    ESOPHAGOGASTRODUODENOSCOPY  03/27/2013   hiatal hernia/GERD   Family History  Problem Relation Age of Onset   Arthritis Mother    Osteoporosis Mother        "severe"   Cancer Sister    Kidney cancer Sister    Colon cancer Maternal Grandmother    Ovarian cancer Maternal  Grandmother    Bone cancer Maternal Grandfather    Heart disease Maternal Grandfather    Breast cancer Paternal Grandmother    Allergies as of 01/10/2019   No Known Allergies     Medication List       Accurate as of January 10, 2019 10:03 AM. If you have any questions, ask your nurse or doctor.        Cholecalciferol 25 MCG (1000 UT) tablet Take 1 tablet (1,000 Units total) by mouth daily.   Fish Oil 1000 MG Caps Take 1 capsule by mouth daily.   GLUCOSAMINE CHONDR 500 COMPLEX PO Take 1 tablet by mouth as needed.   ibuprofen 600 MG tablet Commonly known as: ADVIL Take 600 mg by mouth every 6 (six) hours as needed.   IRON PO Take 1 tablet by mouth daily.   Pepcid 20 MG tablet Generic drug: famotidine Take 1 tablet by mouth daily as needed.   sucralfate 1 g tablet Commonly known as: Carafate Take 1 tablet (1 g total) by mouth 4 (four) times daily -  with meals and at bedtime.   vitamin B-12 500 MCG tablet Commonly known as: V-R VITAMIN B-12 Take 1 tablet (500 mcg total) by mouth daily.   Zinc 15 MG Caps Take 2 capsules by mouth daily.       All past medical history, surgical history, allergies, family history, immunizations andmedications were updated in the EMR today and reviewed under the history and medication portions of their EMR.     ROS: Negative, with the exception of above mentioned in HPI  Objective:  BP 125/86 (BP Location: Right Arm, Patient Position: Sitting, Cuff Size: Normal)    Pulse 67    Temp 97.9 F (36.6 C) (Temporal)    Resp 17    Ht 5\' 2"  (1.575 m)    Wt 188 lb 4 oz (85.4 kg)    LMP 12/20/2018 (Exact Date)    SpO2 97%    BMI 34.43 kg/m  Body mass index is 34.43 kg/m. Gen: Afebrile. No acute distress. Nontoxic in appearance, well developed, well nourished. Pleasant caucasian female.  HENT: AT. [place].  Eyes:Pupils Equal Round Reactive to light, Extraocular movements intact,  Conjunctiva without redness, discharge or icterus. MSK:     -Lumbar spine/hip: No erythema, no bony tenderness over lumbar spine or bony prominence of bilateral hip..  Mild discomfort bilateral SI joint palpation.  Negative straight leg raise bilaterally.  Positive FABRE left for hip pain.  Pain and decreased internal rotation of  left hip. Neuro:  Normal gait. PERLA. EOMi. Alert. Oriented x3 .  5/5 muscle strength bilateral lower extremities.  DTRs equal bilaterally.  Neurovascularly intact distally. Psych: Normal affect, dress and demeanor. Normal speech. Normal thought content and judgment.  No exam data present No results found. No results found for this or any previous visit (from the past 24 hour(s)).  Assessment/Plan: Laryah Zuchowski is a 48 y.o. female present for OV for  Polyarthralgia/thoracic back pain/neck pain/bilateral knee pain Patient has multiple somatic complaints possibly related to arthritic condition.  She does have rheumatoid arthritis history in her family.  We will rule out inflammatory arthritis/rheumatoid and collect autoimmune panel, as well as thyroid panel. - Basic Metabolic Panel (BMET) - Sedimentation rate - C-reactive protein - Cyclic citrul peptide antibody, IgG ([facility]) - TSH - Rheumatoid Factor - ANA, IFA Comprehensive Panel-([facility]) - Vitamin D (25 hydroxy)  Vitamin D deficiency -She has a history of vitamin D deficiency.  Currently not taking her vitamin D.  We will recheck levels today to rule out cause of her constellation of symptoms. - Vitamin D (25 hydroxy)  Hip pain Her exam is consistent today for anterior hip joint pain.  We will start with an x-ray today and likely referral to orthopedics.  X-ray to rule out arthritis as possible cause of her discomfort.  Discussed with her her report of symptoms sounds like she possibly could also have a hip labral issue.  - DG HIPS BILAT W OR W/O PELVIS 3-4 VIEWS; Future  Iron deficiency/anemia Patient has had iron deficiency anemia in the past.  Currently not taking  iron.  We will recheck levels today to rule out iron deficiency as cause of her constellation of weakness, fatigue and somatic complaints - Iron, TIBC and Ferritin Panel - CBC w/Diff  Patient started on diclofenac for her muscle skeletal complaints.  Consider adding Cymbalta if needed at her follow-up in 4 weeks.   Reviewed expectations re: course of current medical issues.  Discussed self-management of symptoms.  Outlined signs and symptoms indicating need for more acute intervention.  Patient verbalized understanding and all questions were answered.  Patient received an After-Visit Summary.    No orders of the defined types were placed in this encounter.  > 25 minutes spent with patient, >50% of time spent face to face    Note is dictated utilizing voice recognition software. Although note has been proof read prior to signing, occasional typographical errors still can be missed. If any questions arise, please do not hesitate to call for verification.   electronically signed by:  Howard Pouch, DO  [place]

## 2019-01-11 ENCOUNTER — Telehealth: Payer: Self-pay | Admitting: Family Medicine

## 2019-01-11 LAB — IRON,TIBC AND FERRITIN PANEL
%SAT: 20 % (calc) (ref 16–45)
Ferritin: 12 ng/mL — ABNORMAL LOW (ref 16–232)
Iron: 79 ug/dL (ref 40–190)
TIBC: 404 mcg/dL (calc) (ref 250–450)

## 2019-01-11 MED ORDER — DICLOFENAC SODIUM ER 100 MG PO TB24: 100.0000 mg | ORAL_TABLET | Freq: Every day | ORAL | 2 refills | Status: DC

## 2019-01-11 NOTE — Telephone Encounter (Signed)
Please inform patient the following information: Her hip x-ray did not show any indication for her pain.  It did not show much arthritis in her hip joint either.  Again this could be possibly pain caused by an old injury to the lining of the hip as we discussed in her appointment.  Currently all of her lab work is normal with a negative rheumatoid factor, negative inflammatory markers, normal liver and kidney function.  Normal vitamin D level.  Normal thyroid panel.  She did have low ferritin, which is the iron stores in her body.  I would advise her to take an over-the-counter iron supplement 3 days a week to help replete her iron stores.  This can cause increasing fatigue and muscle weakness/achiness.  She also had mildly lower platelets-which is a cell in the blood that helps with blood clotting.  Mildly low and we will continue to monitor her on this could be secondary to her ibuprofen use.  Since her lab work appears negative so far, I would suggest starting a daily anti-inflammatory called diclofenac.  It is a once a day tablet, take with a meal, avoid use of Advil or naproxen with use of this medication.  We will call her with results of the last lab test called ANA, this can take a few days to return.

## 2019-01-11 NOTE — Telephone Encounter (Signed)
Pt was called and given lab results, she verbalized understanding.  

## 2019-01-12 ENCOUNTER — Telehealth: Payer: Self-pay | Admitting: Family Medicine

## 2019-01-12 DIAGNOSIS — M25559 Pain in unspecified hip: Secondary | ICD-10-CM

## 2019-01-12 HISTORY — DX: Pain in unspecified hip: M25.559

## 2019-01-12 LAB — ANA, IFA COMPREHENSIVE PANEL
Anti Nuclear Antibody (ANA): NEGATIVE
ENA SM Ab Ser-aCnc: <1 AI
SM/RNP: <1 AI
SSA (Ro) (ENA) Antibody, IgG: <1 AI
SSB (La) (ENA) Antibody, IgG: <1 AI
Scleroderma (Scl-70) (ENA) Antibody, IgG: <1 AI
ds DNA Ab: 1 IU/mL

## 2019-01-12 LAB — RHEUMATOID FACTOR: Rheumatoid fact SerPl-aCnc: <14 IU/mL (ref <14)

## 2019-01-12 LAB — CYCLIC CITRUL PEPTIDE ANTIBODY, IGG: Cyclic Citrullin Peptide Ab: <16 UNITS

## 2019-01-12 NOTE — Telephone Encounter (Signed)
Pt was called and information/results were given to patient, he verbalized understanding

## 2019-01-12 NOTE — Telephone Encounter (Signed)
Her ANA - autoimmune lab is also negative (normal).  Start the diclofenac (told in yesterdays phone call).  I referred her to orthopedics for further evaluation on cause of her hip pain They will call her to schedule.

## 2019-01-18 ENCOUNTER — Ambulatory Visit: Payer: 59 | Admitting: Orthopaedic Surgery

## 2019-01-27 ENCOUNTER — Ambulatory Visit: Payer: Self-pay

## 2019-01-27 ENCOUNTER — Other Ambulatory Visit: Payer: Self-pay

## 2019-01-27 ENCOUNTER — Encounter: Payer: Self-pay | Admitting: Orthopaedic Surgery

## 2019-01-27 ENCOUNTER — Ambulatory Visit (INDEPENDENT_AMBULATORY_CARE_PROVIDER_SITE_OTHER): Payer: 59 | Admitting: Orthopaedic Surgery

## 2019-01-27 DIAGNOSIS — M25552 Pain in left hip: Secondary | ICD-10-CM | POA: Diagnosis not present

## 2019-01-27 NOTE — Progress Notes (Signed)
Subjective: Patient is here for ultrasound-guided intra-articular left hip injection.   Catching anterior pain.  Objective:  Good ROM, some pain with IR.  Limited diagnostic ultrasound shows a possible tear of the anterior labrum.  Procedure: Ultrasound-guided left hip injection: After sterile prep with Betadine, injected 8 cc 1% lidocaine without epinephrine and 40 mg methylprednisolone using a 22-gauge spinal needle, passing the needle through the iliofemoral ligament into the femoral head/neck junction.  Injectate seen filling the joint capsule.  She had good immediate relief.

## 2019-01-27 NOTE — Progress Notes (Signed)
Office Visit Note   Patient: Rebecca Morrison           Date of Birth: 06-28-1970           MRN: [id number] Visit Date: 01/27/2019              Requested by: [name], DO [id number] [place],  [place] [address] PCP: [name], DO   Assessment & Plan: Visit Diagnoses:  1. Pain in left hip     Plan: Impression is left hip labral tear.  We will refer the patient to Dr. Junius Roads for an ultrasound-guided cortisone injection.  She will follow-up with [place] as needed.  Follow-Up Instructions: Return if symptoms worsen or fail to improve.   Orders:  No orders of the defined types were placed in this encounter.  No orders of the defined types were placed in this encounter.     Procedures: No procedures performed   Clinical Data: No additional findings.   Subjective: Chief Complaint  Patient presents with  . Right Hip - Pain  . Left Hip - Pain    HPI patient is a pleasant 48 year old female who presents our clinic today with left hip pain.  This began approximately 3-1/2 years ago.  No known injury or change in activity.  The pain she has is sharp and was initially intermittent but now has become more constant.  The pain occurs with certain motions of the hip.  She has been unable to exercise and has gained a fair amount of weight due to her pain.  She has tried over-the-counter medications without relief of symptoms.  She has been worked up for autoimmune disease by her PCP which was negative.  Review of Systems as detailed in HPI.  All others reviewed and are negative.   Objective: Vital Signs: There were no vitals taken for this visit.  Physical Exam well-developed well-nourished female in no acute distress.  Alert oriented x3.  Ortho Exam examination of her left hip reveals a negative logroll.  She does have a positive FADIR.  She has increased pain with hip flexion.  No pain with abduction or abduction.  No focal weakness.  Negative straight leg raise.  She is  neurovascularly intact distally.  Specialty Comments:  No specialty comments available.  Imaging: X-rays reviewed by me in canopy reveal no acute findings   PMFS History: Patient Active Problem List   Diagnosis Date Noted  . Hip pain 01/12/2019  . Chronic pain of both knees 01/10/2019  . Back pain 03/05/2018  . Neck pain 03/05/2018  . Symptomatic mammary hypertrophy 03/05/2018  . Dyspepsia 01/27/2018  . Vitamin D deficiency 01/19/2018  . Vitamin B12 deficiency 01/19/2018  . Depression with anxiety 01/18/2018  . Seasonal allergies 07/02/2015  . Encounter for long-term (current) use of medications 07/02/2015  . Obesity (BMI 30-39.9) 07/02/2015  . GERD (gastroesophageal reflux disease) 07/02/2015  . Left hip pain 07/02/2015   Past Medical History:  Diagnosis Date  . Allergy   . Anemia    pt states iron deficient  . Cyst of right breast 06/19/2014   US/mamm, confirmed cyst 64mm  . GERD (gastroesophageal reflux disease)   . Hiatal hernia    3/1//2016 Dr. Gala Lewandowsky  . Menorrhagia   . Murmur    Patient states echocardiogram was normal  . Tinnitus    ENT evaluated    Family History  Problem Relation Age of Onset  . Arthritis Mother   . Osteoporosis Mother        "  severe"  . Cancer Sister   . Kidney cancer Sister   . Colon cancer Maternal Grandmother   . Ovarian cancer Maternal Grandmother   . Bone cancer Maternal Grandfather   . Heart disease Maternal Grandfather   . Breast cancer Paternal Grandmother   . Rheum arthritis Maternal Great-grandmother     Past Surgical History:  Procedure Laterality Date  . COLONOSCOPY  07/10/2011   rectal bleeding, FHX. Normal colonoscopy.   . ESOPHAGOGASTRODUODENOSCOPY  03/27/2013   hiatal hernia/GERD   Social History   Occupational History  . Not on file  Tobacco Use  . Smoking status: Former Research scientist (life sciences)  . Smokeless tobacco: Never Used  Substance and Sexual Activity  . Alcohol use: Yes    Alcohol/week: 5.0 - 6.0 standard drinks     Types: 5 - 6 Glasses of wine per week  . Drug use: No  . Sexual activity: Yes    Birth control/protection: Surgical    Comment: partner-vasectomy

## 2019-02-10 ENCOUNTER — Encounter: Payer: 59 | Admitting: Family Medicine

## 2019-02-25 HISTORY — PX: BREAST REDUCTION SURGERY: SHX8

## 2019-03-04 ENCOUNTER — Encounter: Payer: 59 | Admitting: Family Medicine

## 2019-03-07 ENCOUNTER — Other Ambulatory Visit: Payer: Self-pay

## 2019-03-08 ENCOUNTER — Encounter: Payer: 59 | Admitting: Women's Health

## 2019-03-31 ENCOUNTER — Other Ambulatory Visit: Payer: Self-pay

## 2019-03-31 ENCOUNTER — Ambulatory Visit (INDEPENDENT_AMBULATORY_CARE_PROVIDER_SITE_OTHER): Payer: 59 | Admitting: Family Medicine

## 2019-03-31 ENCOUNTER — Encounter: Payer: Self-pay | Admitting: Family Medicine

## 2019-03-31 VITALS — BP 122/85 | HR 76 | Temp 98.2°F | Resp 16 | Ht 62.25 in | Wt 184.5 lb

## 2019-03-31 DIAGNOSIS — Z131 Encounter for screening for diabetes mellitus: Secondary | ICD-10-CM | POA: Diagnosis not present

## 2019-03-31 DIAGNOSIS — E538 Deficiency of other specified B group vitamins: Secondary | ICD-10-CM

## 2019-03-31 DIAGNOSIS — Z0000 Encounter for general adult medical examination without abnormal findings: Secondary | ICD-10-CM

## 2019-03-31 DIAGNOSIS — E669 Obesity, unspecified: Secondary | ICD-10-CM

## 2019-03-31 DIAGNOSIS — E611 Iron deficiency: Secondary | ICD-10-CM

## 2019-03-31 DIAGNOSIS — E559 Vitamin D deficiency, unspecified: Secondary | ICD-10-CM | POA: Diagnosis not present

## 2019-03-31 LAB — CBC
HCT: 38.1 % (ref 36.0–46.0)
Hemoglobin: 13 g/dL (ref 12.0–15.0)
MCHC: 34.1 g/dL (ref 30.0–36.0)
MCV: 92.4 fl (ref 78.0–100.0)
Platelets: 135 10*3/uL — ABNORMAL LOW (ref 150.0–400.0)
RBC: 4.13 Mil/uL (ref 3.87–5.11)
RDW: 14.2 % (ref 11.5–15.5)
WBC: 5.4 10*3/uL (ref 4.0–10.5)

## 2019-03-31 LAB — LIPID PANEL
Cholesterol: 194 mg/dL (ref 0–200)
HDL: 49.6 mg/dL (ref >39.00)
LDL Cholesterol: 134 mg/dL — ABNORMAL HIGH (ref 0–99)
NonHDL: 144.53
Total CHOL/HDL Ratio: 4
Triglycerides: 53 mg/dL (ref 0.0–149.0)
VLDL: 10.6 mg/dL (ref 0.0–40.0)

## 2019-03-31 LAB — VITAMIN B12: Vitamin B-12: 634 pg/mL (ref 211–911)

## 2019-03-31 LAB — VITAMIN D 25 HYDROXY (VIT D DEFICIENCY, FRACTURES): VITD: 20.77 ng/mL — ABNORMAL LOW (ref 30.00–100.00)

## 2019-03-31 LAB — HEMOGLOBIN A1C: Hgb A1c MFr Bld: 5 % (ref 4.6–6.5)

## 2019-03-31 NOTE — Patient Instructions (Signed)
COVID-19 Vaccine Information can be found at: [email or web address] For questions related to vaccine distribution or appointments, please email [email or web address] or call [phone].  Covid Vaccine appointment go to [email or web address].   Health Maintenance, Female Adopting a healthy lifestyle and getting preventive care are important in promoting health and wellness. Ask your health care provider about:  The right schedule for you to have regular tests and exams.  Things you can do on your own to prevent diseases and keep yourself healthy. What should I know about diet, weight, and exercise? Eat a healthy diet   Eat a diet that includes plenty of vegetables, fruits, low-fat dairy products, and lean protein.  Do not eat a lot of foods that are high in solid fats, added sugars, or sodium. Maintain a healthy weight Body mass index (BMI) is used to identify weight problems. It estimates body fat based on height and weight. Your health care provider can help determine your BMI and help you achieve or maintain a healthy weight. Get regular exercise Get regular exercise. This is one of the most important things you can do for your health. Most adults should:  Exercise for at least 150 minutes each week. The exercise should increase your heart rate and make you sweat (moderate-intensity exercise).  Do strengthening exercises at least twice a week. This is in addition to the moderate-intensity exercise.  Spend less time sitting. Even light physical activity can be beneficial. Watch cholesterol and blood lipids Have your blood tested for lipids and cholesterol at 49 years of age, then have this test every 5 years. Have your cholesterol levels checked more often if:  Your lipid or cholesterol levels are high.  You are older than 49 years of age.  You are at high risk for heart disease. What should I know about cancer  screening? Depending on your health history and family history, you may need to have cancer screening at various ages. This may include screening for:  Breast cancer.  Cervical cancer.  Colorectal cancer.  Skin cancer.  Lung cancer. What should I know about heart disease, diabetes, and high blood pressure? Blood pressure and heart disease  High blood pressure causes heart disease and increases the risk of stroke. This is more likely to develop in people who have high blood pressure readings, are of African descent, or are overweight.  Have your blood pressure checked: ? Every 3-5 years if you are 18-39 years of age. ? Every year if you are 40 years old or older. Diabetes Have regular diabetes screenings. This checks your fasting blood sugar level. Have the screening done:  Once every three years after age 40 if you are at a normal weight and have a low risk for diabetes.  More often and at a younger age if you are overweight or have a high risk for diabetes. What should I know about preventing infection? Hepatitis B If you have a higher risk for hepatitis B, you should be screened for this virus. Talk with your health care provider to find out if you are at risk for hepatitis B infection. Hepatitis C Testing is recommended for:  Everyone born from 1945 through 1965.  Anyone with known risk factors for hepatitis C. Sexually transmitted infections (STIs)  Get screened for STIs, including gonorrhea and chlamydia, if: ? You are sexually active and are younger than 49 years of age. ? You are older than 49 years of age and your health care provider tells you that you are at risk for   this type of infection. ? Your sexual activity has changed since you were last screened, and you are at increased risk for chlamydia or gonorrhea. Ask your health care provider if you are at risk.  Ask your health care provider about whether you are at high risk for HIV. Your health care provider may  recommend a prescription medicine to help prevent HIV infection. If you choose to take medicine to prevent HIV, you should first get tested for HIV. You should then be tested every 3 months for as long as you are taking the medicine. Pregnancy  If you are about to stop having your period (premenopausal) and you may become pregnant, seek counseling before you get pregnant.  Take 400 to 800 micrograms (mcg) of folic acid every day if you become pregnant.  Ask for birth control (contraception) if you want to prevent pregnancy. Osteoporosis and menopause Osteoporosis is a disease in which the bones lose minerals and strength with aging. This can result in bone fractures. If you are 65 years old or older, or if you are at risk for osteoporosis and fractures, ask your health care provider if you should:  Be screened for bone loss.  Take a calcium or vitamin D supplement to lower your risk of fractures.  Be given hormone replacement therapy (HRT) to treat symptoms of menopause. Follow these instructions at home: Lifestyle  Do not use any products that contain nicotine or tobacco, such as cigarettes, e-cigarettes, and chewing tobacco. If you need help quitting, ask your health care provider.  Do not use street drugs.  Do not share needles.  Ask your health care provider for help if you need support or information about quitting drugs. Alcohol use  Do not drink alcohol if: ? Your health care provider tells you not to drink. ? You are pregnant, may be pregnant, or are planning to become pregnant.  If you drink alcohol: ? Limit how much you use to 0-1 drink a day. ? Limit intake if you are breastfeeding.  Be aware of how much alcohol is in your drink. In the [place] one drink equals one 12 oz bottle of beer (355 mL), one 5 oz glass of wine (148 mL), or one 1 oz glass of hard liquor (44 mL). General instructions  Schedule regular health, dental, and eye exams.  Stay current with your  vaccines.  Tell your health care provider if: ? You often feel depressed. ? You have ever been abused or do not feel safe at home. Summary  Adopting a healthy lifestyle and getting preventive care are important in promoting health and wellness.  Follow your health care provider's instructions about healthy diet, exercising, and getting tested or screened for diseases.  Follow your health care provider's instructions on monitoring your cholesterol and blood pressure. This information is not intended to replace advice given to you by your health care provider. Make sure you discuss any questions you have with your health care provider. Document Revised: 02/03/2018 Document Reviewed: 02/03/2018 Elsevier Patient Education  2020 [employer]   

## 2019-03-31 NOTE — Progress Notes (Signed)
This visit occurred during the SARS-CoV-2 public health emergency.  Safety protocols were in place, including screening questions prior to the visit, additional usage of staff PPE, and extensive cleaning of exam room while observing appropriate contact time as indicated for disinfecting solutions.    Patient ID: Rebecca Morrison, female  DOB: 20-Jun-1970, 49 y.o.   MRN: [id number] Patient Care Team    Relationship Specialty Notifications Start End  [name], DO PCP - General Family Medicine  09/03/15   Esaw Dace, MD Referring Physician Gastroenterology  03/31/19   Darrin Luis, MD Referring Physician Plastic Surgery  03/31/19   Huel Cote, NP Nurse Practitioner Obstetrics and Gynecology  03/31/19     Chief Complaint  Patient presents with  . Annual Exam    Pap smear 03/03/2018. Mammogram 2019. Breast reduction surgery 03/18/19. Fasting.     Subjective:  Rebecca Morrison is a 49 y.o.  Female  present for CPE. All past medical history, surgical history, allergies, family history, immunizations, medications and social history were updated in the electronic medical record today. All recent labs, ED visits and hospitalizations within the last year were reviewed.  B12/iron and vit d def: Patient reports she had been taking her B12, vitamin D and iron up until about 2 weeks ago before her surgery.  Pt recently underwent breast reduction surgery 03/18/2019.  Overall she states she is recovering well from her reduction.  Health maintenance:  Colonoscopy: Family history of colon cancer, personal history of rectal bleeding. Colonoscopy completed 02/2018- 5 yr. Follow up Mammogram: The patient has a family history of breast cancer. She has a personal history of cyst her right breast and recent breast reduction less than 2 weeks ago.  Her last mammogram was completed 2019- [place] Cervical cancer screening: PAP 02/2018- GYNAnnamaria Boots, NP Immunizations: Tetanus completed 06/14/2014, she  has declined the  flu immunizations today. covid vaccine counseling completed.  Infectious disease screening: HIV completed with last pregnancy. DEXA: History of osteoporosis in her mother Assistive device: none Oxygen TJQ:ZESP Patient has a Dental home. Hospitalizations/ED visits: reviewed   Depression screen [facility] 2/9 03/31/2019 01/18/2018 01/18/2018 03/27/2017 07/07/2016  Decreased Interest 0 1 0 0 0  Down, Depressed, Hopeless 0 1 0 0 0  PHQ - 2 Score 0 2 0 0 0  Altered sleeping - 3 - - -  Tired, decreased energy - 3 - - -  Change in appetite - 3 - - -  Feeling bad or failure about yourself  - 1 - - -  Trouble concentrating - 1 - - -  Moving slowly or fidgety/restless - 3 - - -  Suicidal thoughts - 0 - - -  PHQ-9 Score - 16 - - -  Difficult doing work/chores - Somewhat difficult - - -   GAD 7 : Generalized Anxiety Score 01/18/2018 10/30/2015  Nervous, Anxious, on Edge 3 1  Control/stop worrying 3 3  Worry too much - different things 3 3  Trouble relaxing 3 3  Restless 1 0  Easily annoyed or irritable 3 1  Afraid - awful might happen 3 3  Total GAD 7 Score 19 14  Anxiety Difficulty Somewhat difficult Somewhat difficult   Immunization History  Administered Date(s) Administered  . MMR 09/29/2002  . Tdap 02/15/2014, 06/14/2014   Past Medical History:  Diagnosis Date  . Allergy   . Anemia    pt states iron deficient  . Cyst of right breast 06/19/2014   US/mamm, confirmed cyst  68m  . Depression with anxiety 01/18/2018  . GERD (gastroesophageal reflux disease)   . Hiatal hernia    3/1//2016 Dr. LGala Lewandowsky . Menorrhagia   . Murmur    Patient states echocardiogram was normal  . Tinnitus    ENT evaluated   No Known Allergies Past Surgical History:  Procedure Laterality Date  . COLONOSCOPY  07/10/2011   rectal bleeding, FHX. Normal colonoscopy.   . ESOPHAGOGASTRODUODENOSCOPY  03/27/2013   hiatal hernia/GERD   Family History  Problem Relation Age of Onset  . Arthritis Mother    . Osteoporosis Mother        "severe"  . Cancer Sister   . Kidney cancer Sister   . Colon cancer Maternal Grandmother   . Ovarian cancer Maternal Grandmother   . Bone cancer Maternal Grandfather   . Heart disease Maternal Grandfather   . Breast cancer Paternal Grandmother   . 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   Former smoker, quit 2003.   Social alcohol use, no drug use.   Drinks caffeinated beverages, uses herbal remedies, takes a daily vitamin.   Wears her seatbelt, wears a bicycle helmet, exercises at least 3 times a week.   Smoke detector in the home, feels safe in her relationships.    Allergies as of 03/31/2019   No Known Allergies     Medication List       Accurate as of March 31, 2019  2:27 PM. If you have any questions, ask your nurse or doctor.        Cholecalciferol 25 MCG (1000 UT) tablet Take 1 tablet (1,000 Units total) by mouth daily.   Diclofenac Sodium CR 100 MG 24 hr tablet Take 1 tablet (100 mg total) by mouth daily.   Fish Oil 1000 MG Caps Take 1 capsule by mouth daily.   GLUCOSAMINE CHONDR 500 COMPLEX PO Take 1 tablet by mouth as needed.   ibuprofen 600 MG tablet Commonly known as: ADVIL Take 600 mg by mouth every 6 (six) hours as needed.   IRON PO Take 1 tablet by mouth daily.   Pepcid 20 MG tablet Generic drug: famotidine Take 1 tablet by mouth daily as needed.   vitamin B-12 500 MCG tablet Commonly known as: V-R VITAMIN B-12 Take 1 tablet (500 mcg total) by mouth daily.   Zinc 15 MG Caps Take 2 capsules by mouth daily.      All past medical history, surgical history, allergies, family history, immunizations andmedications were updated in the EMR today and reviewed under the history and medication portions of their EMR.     ROS: 14  pt review of systems performed and negative (unless mentioned in an HPI)  Objective: BP 122/85 (BP Location: Right Arm, Patient Position: Sitting, Cuff Size: Normal)   Pulse 76   Temp 98.2 F (36.8 C) (Temporal)   Resp 16   Ht 5' 2.25" (1.581 m)   Wt 184 lb 8 oz (83.7 kg)   LMP 03/31/2019 (Exact Date)   SpO2 99%   BMI 33.47 kg/m  Gen: Afebrile. No acute distress. Nontoxic in appearance, well-developed, well-nourished,  Pleasant caucasian female.  HENT: AT. [place]. Bilateral TM visualized and normal in appearance, normal external auditory canal. MMM, no oral lesions, adequate dentition. Bilateral nares within normal limits. Throat without erythema, ulcerations or exudates.  no Cough on exam, no hoarseness on exam. Eyes:Pupils Equal Round Reactive to light, Extraocular movements intact,  Conjunctiva without redness, discharge or icterus. Neck/lymp/endocrine: Supple,no lymphadenopathy, no thyromegaly CV: RRR, no edema, +2/4 P posterior tibialis pulses. Chest: CTAB, no wheeze, rhonchi or crackles. normal Respiratory effort. good Air movement. Abd: Soft. flat. NTND. BS present. no Masses palpated. No hepatosplenomegaly. No rebound tenderness or guarding. Skin: no rashes, purpura or petechiae. Warm and well-perfused. Skin intact. Neuro/Msk: Normal gait. PERLA. EOMi. Alert. Oriented x3.  Cranial nerves II through XII intact. Muscle strength 5/5 upper/lower extremity. DTRs equal bilaterally. Psych: Normal affect, dress and demeanor. Normal speech. Normal thought content and judgment.  No exam data present  Assessment/plan: Ruble Buttler is a 49 y.o. female present for CPE Obesity (BMI 30-39.9) -Diet and exercise encouraged. - Lipid panel  Vitamin B12 deficiency/vitamin D deficiency/iron deficiency Had been supplementing up until 2 weeks ago.  We will recheck levels today and guide her on future doses depending upon those levels. - B12 - Vitamin D (25 hydroxy) - CBC - Iron, TIBC and Ferritin  Panel  Diabetes mellitus screening - Hemoglobin A1c  Encounter for preventive health examination Patient was encouraged to exercise greater than 150 minutes a week. Patient was encouraged to choose a diet filled with fresh fruits and vegetables, and lean meats. AVS provided to patient today for education/recommendation on gender specific health and safety maintenance. Colonoscopy: Family history of colon cancer, personal history of rectal bleeding. Colonoscopy completed 02/2018- 5 yr. Follow up Mammogram: Mammogram is due, however patient with recent breast reduction 2 weeks ago.  Encouraged her to discuss timeline for next mammogram with her surgeon.  Cervical cancer screening: PAP 02/2018- [facility] Immunizations: Tetanus completed 06/14/2014, she has declined the  flu immunizations today. covid vaccine counseling completed.  Infectious disease screening: HIV completed with last pregnancy. DEXA: History of osteoporosis in her mother  Return in about 1 year (around 03/30/2020) for CPE (30 min).  Orders Placed This Encounter  Procedures  . CBC  . Hemoglobin A1c  . Lipid panel  . B12  . Vitamin D (25 hydroxy)  . Iron, TIBC and Ferritin Panel  No orders of the defined types were placed in this encounter.  Referral Orders  No referral(s) requested today    Electronically signed by: Howard Pouch, [place]

## 2019-04-01 ENCOUNTER — Telehealth: Payer: Self-pay | Admitting: Family Medicine

## 2019-04-01 LAB — IRON,TIBC AND FERRITIN PANEL
%SAT: 16 % (calc) (ref 16–45)
Ferritin: 29 ng/mL (ref 16–232)
Iron: 63 ug/dL (ref 40–190)
TIBC: 392 mcg/dL (calc) (ref 250–450)

## 2019-04-01 NOTE — Telephone Encounter (Signed)
Please inform patient the following information: -Her cholesterol panel is at goal. -Her blood cell counts are normal, with the exception of her platelets being very mildly lower than normal.  However, current level of platelets are stable from her prior collections.  She occasionally has mildly lower than normal platelets.  This level is not concerning. -Her B12 levels have greatly improved with use of the oral B12 and is now 634.  I would encourage her to continue her B12 supplement. -Her iron levels are also now normal.  I would encourage her to eat a iron rich diet to keep her iron level supplemented.  She could also consider taking an iron supplement 1 or 2 times a week just to keep her levels more range. -Vitamin D levels are abnormally low at 20.  I would encourage her to restart her vitamin D tablets.   She reports she stopped her B12 and vitamin D supplementation secondary to her recent surgery.  It is okay for her to restart these now.

## 2019-04-01 NOTE — Telephone Encounter (Signed)
Pt was called and given results, she verbalized understanding

## 2019-04-12 ENCOUNTER — Encounter: Payer: 59 | Admitting: Women's Health

## 2019-04-27 ENCOUNTER — Other Ambulatory Visit: Payer: Self-pay | Admitting: Obstetrics & Gynecology

## 2019-04-27 DIAGNOSIS — N920 Excessive and frequent menstruation with regular cycle: Secondary | ICD-10-CM

## 2019-06-07 ENCOUNTER — Encounter: Payer: 59 | Admitting: Women's Health

## 2019-06-23 ENCOUNTER — Other Ambulatory Visit: Payer: Self-pay

## 2019-06-23 ENCOUNTER — Encounter: Payer: Self-pay | Admitting: Nurse Practitioner

## 2019-06-23 ENCOUNTER — Ambulatory Visit (INDEPENDENT_AMBULATORY_CARE_PROVIDER_SITE_OTHER): Payer: 59 | Admitting: Nurse Practitioner

## 2019-06-23 VITALS — BP 120/80 | Ht 63.0 in | Wt 175.2 lb

## 2019-06-23 DIAGNOSIS — Z6830 Body mass index (BMI) 30.0-30.9, adult: Secondary | ICD-10-CM

## 2019-06-23 DIAGNOSIS — N921 Excessive and frequent menstruation with irregular cycle: Secondary | ICD-10-CM | POA: Diagnosis not present

## 2019-06-23 DIAGNOSIS — Z01419 Encounter for gynecological examination (general) (routine) without abnormal findings: Secondary | ICD-10-CM

## 2019-06-23 NOTE — Progress Notes (Signed)
   Rebecca Morrison June 18, 1970 [id number]   History:  49 y.o. MWF G2 P2 presents for annual exam. Complains of heavy cycles that are now more frequent (about every 3 weeks) without breakthrough bleeding, changing ultra tampons every hour on heaviest days. Says it is now affecting her quality of life. Denies menopausal symptoms.  Family history of breast cancer. Breast reduction 03/18/2019 due to neck and back pain. History of Vitamin D and vitamin B12 deficiency-on supplementation, managed by PCP. Also has history of IBS and GERD, sees GI for this. Abnormal pap 2001- LEEP procedure [facility]-2, subsequent paps normal. Sexually active. Going to a wellness clinic for weight loss management, has been on phentermine for 2 months with around 15lb weight loss.    Gynecologic History Patient's last menstrual period was 06/08/2019. Period Duration (Days): 4 days Period Pattern: (!) Irregular Menstrual Flow: Heavy, Light Menstrual Control: Tampon Menstrual Control Change Freq (Hours): every 1-2 hours first and second day of cycle then less frequent afterwards Dysmenorrhea: (!) Mild Dysmenorrhea Symptoms: Cramping Contraception: husband had vasectomy Last Pap: 03/03/2018. Results were: normal Last mammogram: 11/11/2017. Results were: normal  Past medical history, past surgical history, family history and social history were all reviewed and documented in the EPIC chart. 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   ROS:  A ROS was performed and pertinent positives and negatives are included.  Exam:  Vitals:   06/23/19 1122  BP: 120/80  Weight: 175 lb 3.2 oz (79.5 kg)  Height: 5\' 3"  (1.6 m)   Body mass index is 31.04 kg/m.  General appearance:  Normal Thyroid:  Symmetrical, normal in size, without palpable masses or nodularity. Respiratory  Auscultation:  Clear without wheezing or rhonchi Cardiovascular  Auscultation:  Regular rate, without rubs,  murmurs or gallops  Edema/varicosities:  Not grossly evident Abdominal  Soft,nontender, without masses, guarding or rebound.  Liver/spleen:  No organomegaly noted  Hernia:  None appreciated  Skin  Inspection:  Grossly normal. Seborrheic keratosis, scarring on breasts from recent reduction    Breasts: Examined lying and sitting.   Right: Without masses, retractions, discharge or axillary adenopathy.   Left: Without masses, retractions, discharge or axillary adenopathy. Gentitourinary   Inguinal/mons:  Normal without inguinal adenopathy  External genitalia:  Normal  BUS/Urethra/Skene's glands:  Normal  Vagina:  Normal  Cervix:  Normal  Uterus:  Anteverted, normal in size, shape and contour.  Midline and mobile  Adnexa/parametria:     Rt: Without masses or tenderness.   Lt: Without masses or tenderness.  Anus and perineum: Normal  Digital rectal exam: Normal sphincter tone without palpated masses or tenderness  Assessment/Plan:  49 y.o.  for annual exam.  1. Well woman exam with routine gynecological exam No pap today. Labs done with PCP-03/31/19. Plans to schedule mammogram soon-wants to wait on breasts to heal a little more since they are still tender. Education provided on SBEs, importance of screenings, high calcium diet, regular exercise, and weight loss management.   2. Menorrhagia with irregular cycle-will schedule sonohysterogram with possible ablation in the future  3. BMI 30.0-39.9-Discussed weight loss management, will continue to go to wellness clinic      [place], 11:57 AM 06/23/2019

## 2019-06-23 NOTE — Patient Instructions (Addendum)
Endometrial Ablation Endometrial ablation is a procedure that destroys the thin inner layer of the lining of the uterus (endometrium). This procedure may be done:  To stop heavy periods.  To stop bleeding that is causing anemia.  To control irregular bleeding.  To treat bleeding caused by small tumors (fibroids) in the endometrium. This procedure is often an alternative to major surgery, such as removal of the uterus and cervix (hysterectomy). As a result of this procedure:  You may not be able to have children. However, if you are premenopausal (you have not gone through menopause): ? You may still have a small chance of getting pregnant. ? You will need to use a reliable method of birth control after the procedure to prevent pregnancy.  You may stop having a menstrual period, or you may have only a small amount of bleeding during your period. Menstruation may return several years after the procedure. Tell a health care provider about:  Any allergies you have.  All medicines you are taking, including vitamins, herbs, eye drops, creams, and over-the-counter medicines.  Any problems you or family members have had with the use of anesthetic medicines.  Any blood disorders you have.  Any surgeries you have had.  Any medical conditions you have. What are the risks? Generally, this is a safe procedure. However, problems may occur, including:  A hole (perforation) in the uterus or bowel.  Infection of the uterus, bladder, or vagina.  Bleeding.  Damage to other structures or organs.  An air bubble in the lung (air embolus).  Problems with pregnancy after the procedure.  Failure of the procedure.  Decreased ability to diagnose cancer in the endometrium. What happens before the procedure?  You will have tests of your endometrium to make sure there are no pre-cancerous cells or cancer cells present.  You may have an ultrasound of the uterus.  You may be given medicines to  thin the endometrium.  Ask your health care provider about: ? Changing or stopping your regular medicines. This is especially important if you take diabetes medicines or blood thinners. ? Taking medicines such as aspirin and ibuprofen. These medicines can thin your blood. Do not take these medicines before your procedure if your doctor tells you not to.  Plan to have someone take you home from the hospital or clinic. What happens during the procedure?   You will lie on an exam table with your feet and legs supported as in a pelvic exam.  To lower your risk of infection: ? Your health care team will wash or sanitize their hands and put on germ-free (sterile) gloves. ? Your genital area will be washed with soap.  An IV tube will be inserted into one of your veins.  You will be given a medicine to help you relax (sedative).  A surgical instrument with a light and camera (resectoscope) will be inserted into your vagina and moved into your uterus. This allows your surgeon to see inside your uterus.  Endometrial tissue will be removed using one of the following methods: ? Radiofrequency. This method uses a radiofrequency-alternating electric current to remove the endometrium. ? Cryotherapy. This method uses extreme cold to freeze the endometrium. ? Heated-free liquid. This method uses a heated saltwater (saline) solution to remove the endometrium. ? Microwave. This method uses high-energy microwaves to heat up the endometrium and remove it. ? Thermal balloon. This method involves inserting a catheter with a balloon tip into the uterus. The balloon tip is filled with   heated fluid to remove the endometrium. The procedure may vary among health care providers and hospitals. What happens after the procedure?  Your blood pressure, heart rate, breathing rate, and blood oxygen level will be monitored until the medicines you were given have worn off.  As tissue healing occurs, you may notice  vaginal bleeding for 4-6 weeks after the procedure. You may also experience: ? Cramps. ? Thin, watery vaginal discharge that is light pink or brown in color. ? A need to urinate more frequently than usual. ? Nausea.  Do not drive for 24 hours if you were given a sedative.  Do not have sex or insert anything into your vagina until your health care provider approves. Summary  Endometrial ablation is done to treat the many causes of heavy menstrual bleeding.  The procedure may be done only after medications have been tried to control the bleeding.  Plan to have someone take you home from the hospital or clinic. This information is not intended to replace advice given to you by your health care provider. Make sure you discuss any questions you have with your health care provider. Document Revised: 07/28/2017 Document Reviewed: 02/28/2016 Elsevier Patient Education  Boon Maintenance, Female Adopting a healthy lifestyle and getting preventive care are important in promoting health and wellness. Ask your health care provider about:  The right schedule for you to have regular tests and exams.  Things you can do on your own to prevent diseases and keep yourself healthy. What should I know about diet, weight, and exercise? Eat a healthy diet   Eat a diet that includes plenty of vegetables, fruits, low-fat dairy products, and lean protein.  Do not eat a lot of foods that are high in solid fats, added sugars, or sodium. Maintain a healthy weight Body mass index (BMI) is used to identify weight problems. It estimates body fat based on height and weight. Your health care provider can help determine your BMI and help you achieve or maintain a healthy weight. Get regular exercise Get regular exercise. This is one of the most important things you can do for your health. Most adults should:  Exercise for at least 150 minutes each week. The exercise should increase your  heart rate and make you sweat (moderate-intensity exercise).  Do strengthening exercises at least twice a week. This is in addition to the moderate-intensity exercise.  Spend less time sitting. Even light physical activity can be beneficial. Watch cholesterol and blood lipids Have your blood tested for lipids and cholesterol at 49 years of age, then have this test every 5 years. Have your cholesterol levels checked more often if:  Your lipid or cholesterol levels are high.  You are older than 49 years of age.  You are at high risk for heart disease. What should I know about cancer screening? Depending on your health history and family history, you may need to have cancer screening at various ages. This may include screening for:  Breast cancer.  Cervical cancer.  Colorectal cancer.  Skin cancer.  Lung cancer. What should I know about heart disease, diabetes, and high blood pressure? Blood pressure and heart disease  High blood pressure causes heart disease and increases the risk of stroke. This is more likely to develop in people who have high blood pressure readings, are of African descent, or are overweight.  Have your blood pressure checked: ? Every 3-5 years if you are 34-15 years of age. ? Every year if  you are 13 years old or older. Diabetes Have regular diabetes screenings. This checks your fasting blood sugar level. Have the screening done:  Once every three years after age 35 if you are at a normal weight and have a low risk for diabetes.  More often and at a younger age if you are overweight or have a high risk for diabetes. What should I know about preventing infection? Hepatitis B If you have a higher risk for hepatitis B, you should be screened for this virus. Talk with your health care provider to find out if you are at risk for hepatitis B infection. Hepatitis C Testing is recommended for:  Everyone born from [birth year] through 1965.  Anyone with known risk  factors for hepatitis C. Sexually transmitted infections (STIs)  Get screened for STIs, including gonorrhea and chlamydia, if: ? You are sexually active and are younger than 49 years of age. ? You are older than 49 years of age and your health care provider tells you that you are at risk for this type of infection. ? Your sexual activity has changed since you were last screened, and you are at increased risk for chlamydia or gonorrhea. Ask your health care provider if you are at risk.  Ask your health care provider about whether you are at high risk for HIV. Your health care provider may recommend a prescription medicine to help prevent HIV infection. If you choose to take medicine to prevent HIV, you should first get tested for HIV. You should then be tested every 3 months for as long as you are taking the medicine. Pregnancy  If you are about to stop having your period (premenopausal) and you may become pregnant, seek counseling before you get pregnant.  Take 400 to 800 micrograms (mcg) of folic acid every day if you become pregnant.  Ask for birth control (contraception) if you want to prevent pregnancy. Osteoporosis and menopause Osteoporosis is a disease in which the bones lose minerals and strength with aging. This can result in bone fractures. If you are 70 years old or older, or if you are at risk for osteoporosis and fractures, ask your health care provider if you should:  Be screened for bone loss.  Take a calcium or vitamin D supplement to lower your risk of fractures.  Be given hormone replacement therapy (HRT) to treat symptoms of menopause. Follow these instructions at home: Lifestyle  Do not use any products that contain nicotine or tobacco, such as cigarettes, e-cigarettes, and chewing tobacco. If you need help quitting, ask your health care provider.  Do not use street drugs.  Do not share needles.  Ask your health care provider for help if you need support or  information about quitting drugs. Alcohol use  Do not drink alcohol if: ? Your health care provider tells you not to drink. ? You are pregnant, may be pregnant, or are planning to become pregnant.  If you drink alcohol: ? Limit how much you use to 0-1 drink a day. ? Limit intake if you are breastfeeding.  Be aware of how much alcohol is in your drink. In the [place] one drink equals one 12 oz bottle of beer (355 mL), one 5 oz glass of wine (148 mL), or one 1 oz glass of hard liquor (44 mL). General instructions  Schedule regular health, dental, and eye exams.  Stay current with your vaccines.  Tell your health care provider if: ? You often feel depressed. ? You have ever  been abused or do not feel safe at home. Summary  Adopting a healthy lifestyle and getting preventive care are important in promoting health and wellness.  Follow your health care provider's instructions about healthy diet, exercising, and getting tested or screened for diseases.  Follow your health care provider's instructions on monitoring your cholesterol and blood pressure. This information is not intended to replace advice given to you by your health care provider. Make sure you discuss any questions you have with your health care provider. Document Revised: 02/03/2018 Document Reviewed: 02/03/2018 Elsevier Patient Education  2020 [employer].

## 2019-07-07 ENCOUNTER — Encounter: Payer: Self-pay | Admitting: Family Medicine

## 2019-07-07 ENCOUNTER — Ambulatory Visit: Payer: Self-pay

## 2019-07-07 ENCOUNTER — Encounter: Payer: Self-pay | Admitting: Orthopaedic Surgery

## 2019-07-07 ENCOUNTER — Other Ambulatory Visit: Payer: Self-pay

## 2019-07-07 ENCOUNTER — Ambulatory Visit (INDEPENDENT_AMBULATORY_CARE_PROVIDER_SITE_OTHER): Payer: 59 | Admitting: Orthopaedic Surgery

## 2019-07-07 DIAGNOSIS — M25562 Pain in left knee: Secondary | ICD-10-CM | POA: Diagnosis not present

## 2019-07-07 DIAGNOSIS — M25561 Pain in right knee: Secondary | ICD-10-CM | POA: Diagnosis not present

## 2019-07-07 DIAGNOSIS — M25552 Pain in left hip: Secondary | ICD-10-CM

## 2019-07-07 DIAGNOSIS — M25551 Pain in right hip: Secondary | ICD-10-CM | POA: Diagnosis not present

## 2019-07-07 DIAGNOSIS — G8929 Other chronic pain: Secondary | ICD-10-CM

## 2019-07-07 NOTE — Progress Notes (Signed)
Office Visit Note   Patient: Rebecca Morrison           Date of Birth: Aug 26, 1970           MRN: [id number] Visit Date: 07/07/2019              Requested by: [name], DO [address] [place],  [place] [address] PCP: [name], DO   Assessment & Plan: Visit Diagnoses:  1. Bilateral hip pain   2. Chronic pain of both knees     Plan: In terms of the knees she is interested in outpatient PT and home exercises.  Referral made today.  We also sent her upstairs for bilateral hip injections with Dr. Junius Roads today.  Follow-up with me as needed.  Follow-Up Instructions: Return if symptoms worsen or fail to improve.   Orders:  Orders Placed This Encounter  Procedures  . Ambulatory referral to Physical Therapy   No orders of the defined types were placed in this encounter.     Procedures: No procedures performed   Clinical Data: No additional findings.   Subjective: Chief Complaint  Patient presents with  . Left Hip - Pain  . Right Hip - Pain    Patient returns today for bilateral hip pain.  We saw her in December and she did really well from cortisone injections with Dr. Junius Roads so she would like to have a repeat set of injections since she is having recurrent pain for the last month.  She is going to [place] tomorrow.  She has lost 30 pounds since we saw her.  She is also complaining of bilateral knee pain that is anterior and worse with squatting and stairs.  Denies any swelling or mechanical symptoms.   Review of Systems  Constitutional: Negative.   HENT: Negative.   Eyes: Negative.   Respiratory: Negative.   Cardiovascular: Negative.   Endocrine: Negative.   Musculoskeletal: Negative.   Neurological: Negative.   Hematological: Negative.   Psychiatric/Behavioral: Negative.   All other systems reviewed and are negative.    Objective: Vital Signs: LMP 06/08/2019   Physical Exam Vitals and nursing note reviewed.  Constitutional:      Appearance: She is  well-developed.  Pulmonary:     Effort: Pulmonary effort is normal.  Skin:    General: Skin is warm.     Capillary Refill: Capillary refill takes less than 2 seconds.  Neurological:     Mental Status: She is alert and oriented to person, place, and time.  Psychiatric:        Behavior: Behavior normal.        Thought Content: Thought content normal.        Judgment: Judgment normal.     Ortho Exam Bilateral knees show no joint effusion.  Minimal patellofemoral crepitus.  Because her cruciates are stable.  Normal range of motion. Bilateral hip exams are unchanged. Specialty Comments:  No specialty comments available.  Imaging: No results found.   PMFS History: Patient Active Problem List   Diagnosis Date Noted  . Pain in left hip 07/07/2019  . Hip pain 01/12/2019  . Chronic pain of both knees 01/10/2019  . Back pain 03/05/2018  . Neck pain 03/05/2018  . Symptomatic mammary hypertrophy 03/05/2018  . Dyspepsia 01/27/2018  . Vitamin D deficiency 01/19/2018  . Vitamin B12 deficiency 01/19/2018  . Seasonal allergies 07/02/2015  . Encounter for long-term (current) use of medications 07/02/2015  . Obesity (BMI 30-39.9) 07/02/2015  . GERD (  gastroesophageal reflux disease) 07/02/2015  . Left hip pain 07/02/2015   Past Medical History:  Diagnosis Date  . Allergy   . Anemia    pt states iron deficient  . Cyst of right breast 06/19/2014   US/mamm, confirmed cyst 81mm  . Depression with anxiety 01/18/2018  . GERD (gastroesophageal reflux disease)   . Hiatal hernia    3/1//2016 Dr. Gala Lewandowsky  . Menorrhagia   . Murmur    Patient states echocardiogram was normal  . Tinnitus    ENT evaluated    Family History  Problem Relation Age of Onset  . Arthritis Mother   . Osteoporosis Mother        "severe"  . Cancer Sister   . Kidney cancer Sister   . Colon cancer Maternal Grandmother   . Ovarian cancer Maternal Grandmother   . Bone cancer Maternal Grandfather   . Heart disease  Maternal Grandfather   . Breast cancer Paternal Grandmother   . Rheum arthritis Maternal Great-grandmother     Past Surgical History:  Procedure Laterality Date  . BREAST REDUCTION SURGERY Bilateral 02/2019  . COLONOSCOPY  07/10/2011   rectal bleeding, FHX. Normal colonoscopy.   . ESOPHAGOGASTRODUODENOSCOPY  03/27/2013   hiatal hernia/GERD   Social History   Occupational History  . Not on file  Tobacco Use  . Smoking status: Former Research scientist (life sciences)  . Smokeless tobacco: Never Used  Substance and Sexual Activity  . Alcohol use: Yes    Alcohol/week: 5.0 - 6.0 standard drinks    Types: 5 - 6 Glasses of wine per week  . Drug use: No  . Sexual activity: Yes    Birth control/protection: Surgical    Comment: partner-vasectomy

## 2019-07-07 NOTE — Progress Notes (Signed)
Subjective: Patient is here for ultrasound-guided intra-articular bilateral hip injection.   Good relief for 3 months after last injection.  Going to [employer].  Objective:  Pain in both hips with passive IR.  Procedure: Ultrasound-guided bilateral hip injection: After sterile prep with Betadine, injected 8 cc 1% lidocaine without epinephrine and 40 mg methylprednisolone using a 22-gauge spinal needle, passing the needle through the iliofemoral ligament into the femoral head/neck junction.  Injectate seen filling joint capsules.  Good immediate relief.  Was very anxious during procedure.  Next time will call in valium prior to visit.

## 2019-07-26 ENCOUNTER — Encounter: Payer: Self-pay | Admitting: Orthopaedic Surgery

## 2019-07-26 ENCOUNTER — Telehealth: Payer: Self-pay | Admitting: Orthopaedic Surgery

## 2019-07-26 NOTE — Telephone Encounter (Signed)
Pt called again wanting to know if an X-Ray would be needed to determine wether she'll be able to go to PT on 07/28/19. Pt would like a call back or message in MyChart.   [phone]

## 2019-07-26 NOTE — Telephone Encounter (Signed)
Patient called.   She is getting ready to start PT but injured the back of her left knee. She is requesting a call back for advisement on her next step. Thinks she may need to come see Erlinda Hong first.   Call back: [phone]

## 2019-07-26 NOTE — Telephone Encounter (Signed)
I don't feel that she needs it unless she feels better getting some before PT.  I'm happy to order them if she wants.  Based on my evaluation, I'm relatively certain of what is going on but if she would rather get some xrays, I'm not opposed to getting them.  Just trying to save her some money if possible.

## 2019-07-27 ENCOUNTER — Telehealth: Payer: Self-pay | Admitting: Orthopaedic Surgery

## 2019-07-27 NOTE — Telephone Encounter (Signed)
Spoke with patient and read her the note from Dr Erlinda Hong but patient would like to know what the diag is on the back of her left knee. The number to contact patient is [phone]

## 2019-07-27 NOTE — Telephone Encounter (Signed)
Spoke to patient

## 2019-07-28 ENCOUNTER — Ambulatory Visit: Payer: 59 | Admitting: Physical Therapy

## 2019-08-01 ENCOUNTER — Other Ambulatory Visit: Payer: Self-pay | Admitting: Family Medicine

## 2019-08-01 DIAGNOSIS — Z1231 Encounter for screening mammogram for malignant neoplasm of breast: Secondary | ICD-10-CM

## 2019-08-02 ENCOUNTER — Ambulatory Visit (INDEPENDENT_AMBULATORY_CARE_PROVIDER_SITE_OTHER): Payer: 59 | Admitting: Physical Therapy

## 2019-08-02 ENCOUNTER — Other Ambulatory Visit: Payer: Self-pay

## 2019-08-02 ENCOUNTER — Encounter: Payer: Self-pay | Admitting: Physical Therapy

## 2019-08-02 DIAGNOSIS — M25552 Pain in left hip: Secondary | ICD-10-CM

## 2019-08-02 DIAGNOSIS — M25562 Pain in left knee: Secondary | ICD-10-CM | POA: Diagnosis not present

## 2019-08-02 DIAGNOSIS — M25551 Pain in right hip: Secondary | ICD-10-CM | POA: Diagnosis not present

## 2019-08-02 DIAGNOSIS — M25561 Pain in right knee: Secondary | ICD-10-CM | POA: Diagnosis not present

## 2019-08-02 DIAGNOSIS — G8929 Other chronic pain: Secondary | ICD-10-CM

## 2019-08-02 DIAGNOSIS — M6281 Muscle weakness (generalized): Secondary | ICD-10-CM

## 2019-08-02 NOTE — Patient Instructions (Signed)
Access Code: [email or web address]: [email or web address]: 06/08/2021Prepared by: Aaron Edelman NelsonExercises  Supine Quadriceps Stretch with Strap on Table - 2 x daily - 6 x weekly - 2-3 reps - 30 hold  Seated Piriformis Stretch with Trunk Bend - 2 x daily - 6 x weekly - 2 reps - 1 sets - 30 hold  Supine Active Straight Leg Raise - 2 x daily - 6 x weekly - 10 reps - 1-2 sets  Sidelying Hip Abduction - 2 x daily - 6 x weekly - 10 reps - 1-3 sets  Prone Hip Extension - 2 x daily - 6 x weekly - 10 reps - 2 sets  Sit to Stand without Arm Support - 2 x daily - 6 x weekly - 1-2 sets - 10 reps  Single Leg Stance - 2 x daily - 6 x weekly - 1 sets - 5 reps - 20 sec hold

## 2019-08-02 NOTE — Therapy (Signed)
[facility] Physical Therapy [address] [place], [place], [address] Phone: [phone]   Fax:  [phone]  Physical Therapy Evaluation  Patient Details  Name: Rebecca Morrison MRN: [id number] Date of Birth: [birth year] Referring Provider (PT): Leandrew Koyanagi, MD   Encounter Date: 08/02/2019  PT End of Session - 08/02/19 2219    Visit Number  1    Number of Visits  12    Date for PT Re-Evaluation  10/25/19    Authorization Type  UHC no visit limit    PT Start Time  1600    PT Stop Time  1650    PT Time Calculation (min)  50 min    Activity Tolerance  Patient tolerated treatment well    Behavior During Therapy  [facility] for tasks assessed/performed       Past Medical History:  Diagnosis Date   Allergy    Anemia    pt states iron deficient   Cyst of right breast 06/19/2014   US/mamm, confirmed cyst 56mm   Depression with anxiety 01/18/2018   GERD (gastroesophageal reflux disease)    Hiatal hernia    3/1//2016 Dr. Gala Lewandowsky   Menorrhagia    Murmur    Patient states echocardiogram was normal   Tinnitus    ENT evaluated    Past Surgical History:  Procedure Laterality Date   BREAST REDUCTION SURGERY Bilateral 02/2019   COLONOSCOPY  07/10/2011   rectal bleeding, FHX. Normal colonoscopy.    ESOPHAGOGASTRODUODENOSCOPY  03/27/2013   hiatal hernia/GERD    There were no vitals filed for this visit.   Subjective Assessment - 08/02/19 1604    Subjective  Relays bilat hip for last 3 years. Has had bilat knee pain with crunching and popping for last 10 years making it difficult to exercise or walk. She then was bowling a week ago and felt a pop in the back of her Lt knee.    Pertinent History  chronic knee pain for last 10 years    Limitations  Walking;Standing;House hold activities    How long can you stand comfortably?  not limited    How long can you walk comfortably?  a mile    Diagnostic tests  hip XR 2020, "negative"    Currently in Pain?  Yes    Pain Score  8    8 in hip and 3 in knee   Pain Location  Hip    Pain Orientation  Right;Left    Pain Descriptors / Indicators  Sharp    Pain Type  Chronic pain    Pain Radiating Towards  both knees    Aggravating Factors   stairs, downhill, walking    Pain Relieving Factors  resting, does not like ice.         [facility] PT Assessment - 08/02/19 0001      Assessment   Medical Diagnosis  Bilat hip pain, chronic pain of both knees, chondromalacia patella    Referring Provider (PT)  Leandrew Koyanagi, MD    Onset Date/Surgical Date  --   pain for last 10 years   Next MD Visit  nothing scheduled    Prior Therapy  nothing recent      Precautions   Precautions  None      Restrictions   Weight Bearing Restrictions  No      Balance Screen   Has the patient fallen in the past 6 months  No    Has the patient had a decrease in  activity level because of a fear of falling?   No    Is the patient reluctant to leave their home because of a fear of falling?   No      Home Film/video editor residence      Prior Function   Level of Independence  Independent    Vocation  Part time employment    Vocation Requirements  Probation officer    Leisure  hiking, pool      Cognition   Overall Cognitive Status  Within Functional Limits for tasks assessed      Functional Tests   Functional tests  Single leg stance      Single Leg Stance   Comments  able to hold 25 sec but increased sway       ROM / Strength   AROM / PROM / Strength  AROM;PROM;Strength      AROM   Overall AROM Comments  hip ROM WFL bilat but mild tightness in hip rotators both IR and ER    AROM Assessment Site  Knee    Right/Left Knee  Right;Left    Right Knee Extension  0    Right Knee Flexion  135    Left Knee Extension  0    Left Knee Flexion  125      Strength   Strength Assessment Site  Knee;Hip    Right/Left Hip  Right;Left    Right Hip Flexion  4-/5    Right Hip Extension  3+/5    Right Hip  ABduction  3+/5    Left Hip Flexion  3+/5    Left Hip Extension  3+/5    Left Hip ABduction  3+/5    Right/Left Knee  Right;Left    Right Knee Flexion  4+/5    Right Knee Extension  4+/5    Left Knee Flexion  4+/5    Left Knee Extension  4+/5      Flexibility   Soft Tissue Assessment /Muscle Length  --   WNL H.S length, mild tightness quads Lt more than Rt     Transfers   Transfers  Independent with all Transfers      Ambulation/Gait   Gait Comments  mild knee valgus and [place] gait, increased pronation, ambulates without AD or assistance                  Objective measurements completed on examination: See above findings.      [facility] Adult PT Treatment/Exercise - 08/02/19 0001      Exercises   Exercises  Knee/Hip      Knee/Hip Exercises: Aerobic   Recumbent Bike  10 min while PT compiling HEP             PT Education - 08/02/19 2218    Education Details  HEP, POC, activity modifcation to break down activity into smaller periods for tissue tolerace. Gradual walking program of shorter but 2-3 walks a day instead of walking 30 minutes and having severe pain after.    Person(s) Educated  Patient    Methods  Explanation;Demonstration;Verbal cues;Handout    Comprehension  Verbalized understanding;Need further instruction       PT Short Term Goals - 08/02/19 2228      PT SHORT TERM GOAL #1   Title  Pt will be I and compliant with initial HEP.    Time  4    Period  Weeks    Status  New  Target Date  08/30/19      PT SHORT TERM GOAL #2   Title  Pt will improve bilat hip strength to 4/5 MMT to improve function.    Time  4    Period  Weeks    Status  New        PT Long Term Goals - 08/02/19 2229      PT LONG TERM GOAL #1   Title  Pt will be I and compliant with HEP progression and maintain consistent exercise outside of PT.    Time  12    Period  Weeks    Status  New    Target Date  10/25/19      PT LONG TERM GOAL #2   Title  Pt  will improve bilat hip strength to 4+/5 MMT and bilat knee strength to 5/5 MMT to improve function.    Baseline  3+    Time  12    Period  Weeks    Status  New      PT LONG TERM GOAL #3   Title  Pt will report less than 3/10 overall pain with usual activity including walking > one mile and with stairs    Time  12    Period  Weeks    Status  New      PT LONG TERM GOAL #4   Title  Pt will improve Lt knee AROM >130 deg to improve function    Baseline  125    Time  12    Period  Weeks    Status  New             Plan - 08/02/19 2220    Clinical Impression Statement  Pt presents with chronic bilat hip and knee pain consistent with chondromalacia patella. She has overall decreased strength, decreased ROM, decreased Single leg stabiltiy, abnormal gait, and decreased activity tolerance for walking and stairs. She will benefit from skilled PT to address her deficits.    Personal Factors and Comorbidities  Past/Current Experience;Time since onset of injury/illness/exacerbation    Examination-Activity Limitations  Bend;Squat;Stairs;Locomotion Level    Examination-Participation Restrictions  Cleaning;Community Activity;Laundry;Shop;Yard Work    Stability/Clinical Decision Making  Stable/Uncomplicated    Clinical Decision Making  Low    Rehab Potential  Good    PT Frequency  2x / week    PT Duration  12 weeks    PT Treatment/Interventions  ADLs/Self Care Home Management;Aquatic Therapy;Electrical Stimulation;Iontophoresis 4mg /ml Dexamethasone;Moist Heat;Ultrasound;Gait training;Stair training;Therapeutic activities;Therapeutic exercise;Balance training;Neuromuscular re-education;Manual techniques;Patient/family education;Dry needling;Passive range of motion;Taping;Vestibular;Joint Manipulations    PT Next Visit Plan  review and update HEP PRN, how was HEP?, water therapy at her community pool? has she started gradual walking program? She does not like Ice    PT Home Exercise Plan  Access  Code: [email or web address]    Consulted and Agree with Plan of Care  Patient       Patient will benefit from skilled therapeutic intervention in order to improve the following deficits and impairments:  Abnormal gait, Decreased activity tolerance, Decreased balance, Decreased endurance, Decreased range of motion, Difficulty walking, Decreased strength, Impaired flexibility, Pain  Visit Diagnosis: Pain in right hip  Pain in left hip  Chronic pain of right knee  Chronic pain of left knee  Muscle weakness (generalized)     Problem List Patient Active Problem List   Diagnosis Date Noted   Pain in left hip 07/07/2019   Hip pain 01/12/2019   Chronic pain  of both knees 01/10/2019   Back pain 03/05/2018   Neck pain 03/05/2018   Symptomatic mammary hypertrophy 03/05/2018   Dyspepsia 01/27/2018   Vitamin D deficiency 01/19/2018   Vitamin B12 deficiency 01/19/2018   Seasonal allergies 07/02/2015   Encounter for long-term (current) use of medications 07/02/2015   Obesity (BMI 30-39.9) 07/02/2015   GERD (gastroesophageal reflux disease) 07/02/2015   Left hip pain 07/02/2015    Debbe Odea, PT,DPT 08/02/2019, 10:33 PM  [facility] Physical Therapy [address] [place], [place], [address] Phone: [phone]   Fax:  [phone]  Name: Sari Cogan MRN: [id number] Date of Birth: 1970-06-08

## 2019-08-03 ENCOUNTER — Ambulatory Visit (INDEPENDENT_AMBULATORY_CARE_PROVIDER_SITE_OTHER): Payer: 59

## 2019-08-03 DIAGNOSIS — Z1231 Encounter for screening mammogram for malignant neoplasm of breast: Secondary | ICD-10-CM

## 2019-08-09 ENCOUNTER — Encounter: Payer: 59 | Admitting: Physical Therapy

## 2019-08-11 ENCOUNTER — Ambulatory Visit (INDEPENDENT_AMBULATORY_CARE_PROVIDER_SITE_OTHER): Payer: 59 | Admitting: Physical Therapy

## 2019-08-11 ENCOUNTER — Other Ambulatory Visit: Payer: Self-pay

## 2019-08-11 DIAGNOSIS — M25561 Pain in right knee: Secondary | ICD-10-CM | POA: Diagnosis not present

## 2019-08-11 DIAGNOSIS — M25552 Pain in left hip: Secondary | ICD-10-CM

## 2019-08-11 DIAGNOSIS — M6281 Muscle weakness (generalized): Secondary | ICD-10-CM

## 2019-08-11 DIAGNOSIS — M25551 Pain in right hip: Secondary | ICD-10-CM | POA: Diagnosis not present

## 2019-08-11 DIAGNOSIS — G8929 Other chronic pain: Secondary | ICD-10-CM

## 2019-08-11 DIAGNOSIS — M25562 Pain in left knee: Secondary | ICD-10-CM | POA: Diagnosis not present

## 2019-08-11 NOTE — Therapy (Signed)
[place], [place], [address] Phone: [phone]   Fax:  [phone]  Physical Therapy Treatment  Patient Details  Name: Rebecca Morrison MRN: [id number] Date of Birth: Mar 08, 1970 Referring Provider (PT): Leandrew Koyanagi, MD   Encounter Date: 08/11/2019   PT End of Session - 08/11/19 1119    Visit Number 2    Number of Visits 12    Date for PT Re-Evaluation 10/25/19    Authorization Type UHC no visit limit    PT Start Time 1016    PT Stop Time 1101    PT Time Calculation (min) 45 min    Activity Tolerance Patient tolerated treatment well    Behavior During Therapy [facility] for tasks assessed/performed           Past Medical History:  Diagnosis Date  . Allergy   . Anemia    pt states iron deficient  . Cyst of right breast 06/19/2014   US/mamm, confirmed cyst 48m  . Depression with anxiety 01/18/2018  . GERD (gastroesophageal reflux disease)   . Hiatal hernia    3/1//2016 Dr. LGala Lewandowsky . Menorrhagia   . Murmur    Patient states echocardiogram was normal  . Tinnitus    ENT evaluated    Past Surgical History:  Procedure Laterality Date  . BREAST REDUCTION SURGERY Bilateral 02/2019  . COLONOSCOPY  07/10/2011   rectal bleeding, FHX. Normal colonoscopy.   . ESOPHAGOGASTRODUODENOSCOPY  03/27/2013   hiatal hernia/GERD  . REDUCTION MAMMAPLASTY      There were no vitals filed for this visit.   Subjective Assessment - 08/11/19 1117    Subjective about 5/10 pain overall in Rt hip and in Lt knee only today    Pertinent History chronic knee pain for last 10 years    Limitations Walking;Standing;House hold activities    How long can you stand comfortably? not limited    How long can you walk comfortably? a mile    Diagnostic tests hip XR 2020, "negative"            [facility] Adult PT Treatment/Exercise - 08/11/19 0001      Knee/Hip Exercises: Stretches   QSports administratorLeft;3 reps;30 seconds    Quad Stretch Limitations and  Rt 2 reps 30 sec    Hip Flexor Stretch Limitations Rt 3 reps 30 sec and Lt 2 reps 30 sec      Knee/Hip Exercises: Aerobic   Recumbent Bike 8 min L2      Knee/Hip Exercises: Machines for Strengthening   Cybex Knee Extension 5 lbs bilat push 2X10    Cybex Knee Flexion 35 lbs 2X10 bilat    Cybex Leg Press 137 lbs bilat push 2X15 then dropped to 75 lbs for SL push 2X10 bilat      Knee/Hip Exercises: Standing   Other Standing Knee Exercises with red band: hip flexion march, hip abd, hip ext, and H.S curls all X 15 reps bilat    Other Standing Knee Exercises mini squat with lateral walking 5 steps left then 5 steps Right X 2 reps                  PT Education - 08/11/19 1118    Education Details HEP progression    Person(s) Educated Patient    Methods Explanation;Demonstration;Verbal cues;Handout    Comprehension Verbalized understanding;Returned demonstration            PT Short Term Goals - 08/02/19 2228  PT SHORT TERM GOAL #1   Title Pt will be I and compliant with initial HEP.    Time 4    Period Weeks    Status New    Target Date 08/30/19      PT SHORT TERM GOAL #2   Title Pt will improve bilat hip strength to 4/5 MMT to improve function.    Time 4    Period Weeks    Status New             PT Long Term Goals - 08/02/19 2229      PT LONG TERM GOAL #1   Title Pt will be I and compliant with HEP progression and maintain consistent exercise outside of PT.    Time 12    Period Weeks    Status New    Target Date 10/25/19      PT LONG TERM GOAL #2   Title Pt will improve bilat hip strength to 4+/5 MMT and bilat knee strength to 5/5 MMT to improve function.    Baseline 3+    Time 12    Period Weeks    Status New      PT LONG TERM GOAL #3   Title Pt will report less than 3/10 overall pain with usual activity including walking > one mile and with stairs    Time 12    Period Weeks    Status New      PT LONG TERM GOAL #4   Title Pt will improve Lt  knee AROM >130 deg to improve function    Baseline 125    Time 12    Period Weeks    Status New                 Plan - 08/11/19 1138    Clinical Impression Statement Session focused on stretching and general strength for hip and knee. Her HEP was revised some and she showed good understanding of this and good activity tolreance for this. It was her second visit so no goals met yet. Continue POC.    Personal Factors and Comorbidities Past/Current Experience;Time since onset of injury/illness/exacerbation    Examination-Activity Limitations Bend;Squat;Stairs;Locomotion Level    Examination-Participation Restrictions Cleaning;Community Activity;Laundry;Shop;Yard Work    Stability/Clinical Decision Making Stable/Uncomplicated    Rehab Potential Good    PT Frequency 2x / week    PT Duration 12 weeks    PT Treatment/Interventions ADLs/Self Care Home Management;Aquatic Therapy;Electrical Stimulation;Iontophoresis '4mg'$ /ml Dexamethasone;Moist Heat;Ultrasound;Gait training;Stair training;Therapeutic activities;Therapeutic exercise;Balance training;Neuromuscular re-education;Manual techniques;Patient/family education;Dry needling;Passive range of motion;Taping;Vestibular;Joint Manipulations    PT Next Visit Plan how was HEP?, water therapy at her community pool? has she started gradual walking program? She does not like Ice    PT Home Exercise Plan Access Code: [email or web address] then progressed to [facility]    Consulted and Agree with Plan of Care Patient           Patient will benefit from skilled therapeutic intervention in order to improve the following deficits and impairments:  Abnormal gait, Decreased activity tolerance, Decreased balance, Decreased endurance, Decreased range of motion, Difficulty walking, Decreased strength, Impaired flexibility, Pain  Visit Diagnosis: Pain in right hip  Pain in left hip  Chronic pain of right knee  Chronic pain of left knee  Muscle weakness  (generalized)     Problem List Patient Active Problem List   Diagnosis Date Noted  . Pain in left hip 07/07/2019  . Hip pain 01/12/2019  . Chronic pain  of both knees 01/10/2019  . Back pain 03/05/2018  . Neck pain 03/05/2018  . Symptomatic mammary hypertrophy 03/05/2018  . Dyspepsia 01/27/2018  . Vitamin D deficiency 01/19/2018  . Vitamin B12 deficiency 01/19/2018  . Seasonal allergies 07/02/2015  . Encounter for long-term (current) use of medications 07/02/2015  . Obesity (BMI 30-39.9) 07/02/2015  . GERD (gastroesophageal reflux disease) 07/02/2015  . Left hip pain 07/02/2015    Debbe Odea, PT,DPT 08/11/2019, 11:44 AM  [facility] Physical Therapy [address] [place], [place], [address] Phone: [phone]   Fax:  [phone]  Name: Rebecca Morrison MRN: [id number] Date of Birth: 09/25/1970

## 2019-08-16 ENCOUNTER — Other Ambulatory Visit: Payer: Self-pay

## 2019-08-16 ENCOUNTER — Ambulatory Visit (INDEPENDENT_AMBULATORY_CARE_PROVIDER_SITE_OTHER): Payer: 59 | Admitting: Physical Therapy

## 2019-08-16 ENCOUNTER — Encounter: Payer: Self-pay | Admitting: Physical Therapy

## 2019-08-16 DIAGNOSIS — M6281 Muscle weakness (generalized): Secondary | ICD-10-CM

## 2019-08-16 DIAGNOSIS — M25551 Pain in right hip: Secondary | ICD-10-CM

## 2019-08-16 DIAGNOSIS — M25561 Pain in right knee: Secondary | ICD-10-CM | POA: Diagnosis not present

## 2019-08-16 DIAGNOSIS — M25552 Pain in left hip: Secondary | ICD-10-CM | POA: Diagnosis not present

## 2019-08-16 DIAGNOSIS — M25562 Pain in left knee: Secondary | ICD-10-CM | POA: Diagnosis not present

## 2019-08-16 DIAGNOSIS — G8929 Other chronic pain: Secondary | ICD-10-CM

## 2019-08-16 NOTE — Therapy (Addendum)
[place], [place], [address] Phone: [phone]   Fax:  [phone]  Physical Therapy Treatment/Discharge Summary  Patient Details  Name: Rebecca Morrison MRN: [id number] Date of Birth: Sep 22, 1970 Referring Provider (PT): Leandrew Koyanagi, MD   Encounter Date: 08/16/2019   PT End of Session - 08/16/19 1253    Visit Number 3    Number of Visits 12    Date for PT Re-Evaluation 10/25/19    Authorization Type UHC no visit limit    PT Start Time 0932    PT Stop Time 1012    PT Time Calculation (min) 40 min    Activity Tolerance Patient tolerated treatment well    Behavior During Therapy [facility] for tasks assessed/performed           Past Medical History:  Diagnosis Date  . Allergy   . Anemia    pt states iron deficient  . Cyst of right breast 06/19/2014   US/mamm, confirmed cyst 41m  . Depression with anxiety 01/18/2018  . GERD (gastroesophageal reflux disease)   . Hiatal hernia    3/1//2016 Dr. LGala Lewandowsky . Menorrhagia   . Murmur    Patient states echocardiogram was normal  . Tinnitus    ENT evaluated    Past Surgical History:  Procedure Laterality Date  . BREAST REDUCTION SURGERY Bilateral 02/2019  . COLONOSCOPY  07/10/2011   rectal bleeding, FHX. Normal colonoscopy.   . ESOPHAGOGASTRODUODENOSCOPY  03/27/2013   hiatal hernia/GERD  . REDUCTION MAMMAPLASTY      There were no vitals filed for this visit.   Subjective Assessment - 08/16/19 0934    Subjective pain is better, not really having pain now just some tightness    Pertinent History chronic knee pain for last 10 years    Limitations Walking;Standing;House hold activities    How long can you stand comfortably? not limited    How long can you walk comfortably? a mile    Diagnostic tests hip XR 2020, "negative"    Currently in Pain? No/denies                             [facility]      Knee/Hip  Exercises: Stretches   Quad Stretch Both;3 reps;30 seconds    Quad Stretch Limitations supine with strap - foot off     Hip Flexor Stretch Right;Left;3 reps;30 seconds    Hip Flexor Stretch Limitations supine with foot of edge of mat      Knee/Hip Exercises: Aerobic   Recumbent Bike 8 min L3      Knee/Hip Exercises: Machines for Strengthening   Cybex Leg Press 150# 3x10      Knee/Hip Exercises: Plyometrics   Bilateral Jumping Limitations on shuttle with 75# x15      Knee/Hip Exercises: Standing   Functional Squat Limitations sumo squat with 10# KB x10    Other Standing Knee Exercises deadlifts with 15# KB 3x10      Knee/Hip Exercises: Supine   Bridges Both;2 sets;10 reps    Other Supine Knee/Hip Exercises bridge with hamstring curls 2x10 on green physioball                    PT Short Term Goals - 08/02/19 2228      PT SHORT TERM GOAL #1   Title Pt will be I and compliant with initial HEP.  Time 4    Period Weeks    Status New    Target Date 08/30/19      PT SHORT TERM GOAL #2   Title Pt will improve bilat hip strength to 4/5 MMT to improve function.    Time 4    Period Weeks    Status New             PT Long Term Goals - 08/02/19 2229      PT LONG TERM GOAL #1   Title Pt will be I and compliant with HEP progression and maintain consistent exercise outside of PT.    Time 12    Period Weeks    Status New    Target Date 10/25/19      PT LONG TERM GOAL #2   Title Pt will improve bilat hip strength to 4+/5 MMT and bilat knee strength to 5/5 MMT to improve function.    Baseline 3+    Time 12    Period Weeks    Status New      PT LONG TERM GOAL #3   Title Pt will report less than 3/10 overall pain with usual activity including walking > one mile and with stairs    Time 12    Period Weeks    Status New      PT LONG TERM GOAL #4   Title Pt will improve Lt knee AROM >130 deg to improve function    Baseline 125    Time 12    Period Weeks     Status New                 Plan - 08/16/19 1253    Clinical Impression Statement Pt tolerated strengthening well today without increase in pain and demonstrating improvement in functional progress.  Will continue to benefit from PT to maximize function.    Personal Factors and Comorbidities Past/Current Experience;Time since onset of injury/illness/exacerbation    Examination-Activity Limitations Bend;Squat;Stairs;Locomotion Level    Examination-Participation Restrictions Cleaning;Community Activity;Laundry;Shop;Yard Work    Stability/Clinical Decision Making Stable/Uncomplicated    Rehab Potential Good    PT Frequency 2x / week    PT Duration 12 weeks    PT Treatment/Interventions ADLs/Self Care Home Management;Aquatic Therapy;Electrical Stimulation;Iontophoresis '4mg'$ /ml Dexamethasone;Moist Heat;Ultrasound;Gait training;Stair training;Therapeutic activities;Therapeutic exercise;Balance training;Neuromuscular re-education;Manual techniques;Patient/family education;Dry needling;Passive range of motion;Taping;Vestibular;Joint Manipulations    PT Next Visit Plan work on strengthening, gradual loading    PT Home Exercise Plan Access Code: [email or web address] then progressed to [facility]    Consulted and Agree with Plan of Care Patient           Patient will benefit from skilled therapeutic intervention in order to improve the following deficits and impairments:  Abnormal gait, Decreased activity tolerance, Decreased balance, Decreased endurance, Decreased range of motion, Difficulty walking, Decreased strength, Impaired flexibility, Pain  Visit Diagnosis: Pain in right hip  Pain in left hip  Chronic pain of right knee  Chronic pain of left knee  Muscle weakness (generalized)     Problem List Patient Active Problem List   Diagnosis Date Noted  . Pain in left hip 07/07/2019  . Hip pain 01/12/2019  . Chronic pain of both knees 01/10/2019  . Back pain 03/05/2018  . Neck pain  03/05/2018  . Symptomatic mammary hypertrophy 03/05/2018  . Dyspepsia 01/27/2018  . Vitamin D deficiency 01/19/2018  . Vitamin B12 deficiency 01/19/2018  . Seasonal allergies 07/02/2015  . Encounter for long-term (current) use of medications 07/02/2015  .  Obesity (BMI 30-39.9) 07/02/2015  . GERD (gastroesophageal reflux disease) 07/02/2015  . Left hip pain 07/02/2015      Laureen Abrahams, PT, DPT 08/16/19 12:57 PM      [facility] Physical Therapy [address] [place], [place], [address] Phone: [phone]   Fax:  [phone]  Name: Rebecca Morrison MRN: [id number] Date of Birth: June 21, 1970     PHYSICAL THERAPY DISCHARGE SUMMARY  Visits from Start of Care: 3  Current functional level related to goals / functional outcomes: See above   Remaining deficits: unknown   Education / Equipment: HEP  Plan: Patient agrees to discharge.  Patient goals were not met. Patient is being discharged due to not returning since the last visit.  ?????     Laureen Abrahams, PT, DPT 10/05/19 2:09 PM  [facility] [address] [place], [place], [address] Phone: [phone]   Fax:  [phone]

## 2019-08-18 ENCOUNTER — Encounter: Payer: 59 | Admitting: Physical Therapy

## 2019-09-01 ENCOUNTER — Ambulatory Visit (INDEPENDENT_AMBULATORY_CARE_PROVIDER_SITE_OTHER): Payer: 59 | Admitting: Mental Health

## 2019-09-01 ENCOUNTER — Other Ambulatory Visit: Payer: Self-pay

## 2019-09-01 DIAGNOSIS — F1010 Alcohol abuse, uncomplicated: Secondary | ICD-10-CM | POA: Diagnosis not present

## 2019-09-01 DIAGNOSIS — F4322 Adjustment disorder with anxiety: Secondary | ICD-10-CM

## 2019-09-01 NOTE — Progress Notes (Signed)
[facility] Initial Adult Exam  Name: Rebecca Morrison Date: 09/02/2019 MRN: [id number] DOB: Sep 21, 1970 PCP: Howard Pouch A, DO  Time spent: 50 minutes  Reason for Visit /Presenting Problem: she reports a hx of anxiety and was taking Paxil years ago. She is a stay at home mom. She moved her about 5 years ago from [place] due to her husband's job. She gained weight after the move, her stepfather passed. Relies on her faith to cope. She became reclusive after the move however, realizing this she got involved in church, school for their children.  This was a struggle and she began to gain the weight and started drinking more. Coping w/ marital strain. Feels husband is controlling and disrespectful to her at times. Her sons are ages 11 and 63 and now they can be disrespectful to her like their father. Feels she copes w/ ADHD. Likes to be creative. Likes crafts, is creative. She stated she tends to focus on keeping the household running well. She wants to not feel resentful towards her husband. He questions her often- all purchases are questioned. She makes sure her sons have chores.  She wants to quit drinking, work on how she reacts when upset   Mental Status Exam:   Appearance:   Casual     Behavior:  Appropriate  Motor:  Normal  Speech/Language:   Clear and Coherent  Affect:  Full Range  Mood:  depressed and pleasant  Thought process:  normal  Thought content:    WNL  Sensory/Perceptual disturbances:    WNL  Orientation:  x4  Attention:  Good  Concentration:  Good  Memory:  WNL  Fund of knowledge:   Good  Insight:    Good  Judgment:   Good  Impulse Control:  developing   Reported Symptoms: Sad and anxious feelings, impulsivity, irritability, rumination, sleep disturbance  Risk Assessment: Danger to Self:  No Self-injurious Behavior: No Danger to Others: No Duty to Warn:no Physical Aggression / Violence:No  Access to Firearms a concern: No  Gang Involvement:No  Patient /  guardian was educated about steps to take if suicide or homicide risk level increases between visits: yes While future psychiatric events cannot be accurately predicted, the patient does not currently require acute inpatient psychiatric care and does not currently meet [facility] involuntary commitment criteria.  Substance Abuse History: Current substance abuse: alcohol, using about 6-8 drinks most nights for the last several months. Reports hx of rehabilitative treatment in her 20's. Discussed safety planning if any detox symptoms occur (symptoms were denied at present nor any reported history of physical withdrawal). No other substances reported.  Family History:  Family History  Problem Relation Age of Onset  . Arthritis Mother   . Osteoporosis Mother        "severe"  . Cancer Sister   . Kidney cancer Sister   . Colon cancer Maternal Grandmother   . Ovarian cancer Maternal Grandmother   . Bone cancer Maternal Grandfather   . Heart disease Maternal Grandfather   . Breast cancer Paternal Grandmother   . Rheum arthritis Maternal Great-grandmother     Medical History/Surgical History: Past Medical History:  Diagnosis Date  . Allergy   . Anemia    pt states iron deficient  . Cyst of right breast 06/19/2014   US/mamm, confirmed cyst 3mm  . Depression with anxiety 01/18/2018  . GERD (gastroesophageal reflux disease)   . Hiatal hernia    3/1//2016 Dr. Gala Lewandowsky  . Menorrhagia   .  Murmur    Patient states echocardiogram was normal  . Tinnitus    ENT evaluated    Past Surgical History:  Procedure Laterality Date  . BREAST REDUCTION SURGERY Bilateral 02/2019  . COLONOSCOPY  07/10/2011   rectal bleeding, FHX. Normal colonoscopy.   . ESOPHAGOGASTRODUODENOSCOPY  03/27/2013   hiatal hernia/GERD  . REDUCTION MAMMAPLASTY      Medications: Current Outpatient Medications  Medication Sig Dispense Refill  . Cholecalciferol 25 MCG (1000 UT) tablet Take 1 tablet (1,000 Units total) by  mouth daily. 90 tablet 3  . famotidine (PEPCID) 20 MG tablet Take 1 tablet by mouth daily as needed.    . Glucosamine-Chondroit-Vit C-Mn (GLUCOSAMINE CHONDR 500 COMPLEX PO) Take 1 tablet by mouth as needed.     . Omega-3 Fatty Acids (FISH OIL) 1000 MG CAPS Take 1 capsule by mouth daily.    . vitamin B-12 (V-R VITAMIN B-12) 500 MCG tablet Take 1 tablet (500 mcg total) by mouth daily. 90 tablet 3  . Zinc 15 MG CAPS Take 2 capsules by mouth daily.     No current facility-administered medications for this visit.    No Known Allergies  Diagnoses:    ICD-10-CM   1. Adjustment disorder with anxious mood  F43.22   2. Alcohol abuse  F10.10    r/o ADHD  Plan of Care: TBD   An[place], [facility]

## 2019-09-05 ENCOUNTER — Encounter: Payer: 59 | Admitting: Physical Therapy

## 2019-09-07 ENCOUNTER — Encounter: Payer: 59 | Admitting: Physical Therapy

## 2019-10-03 ENCOUNTER — Other Ambulatory Visit: Payer: Self-pay

## 2019-10-03 ENCOUNTER — Ambulatory Visit (INDEPENDENT_AMBULATORY_CARE_PROVIDER_SITE_OTHER): Payer: 59 | Admitting: Mental Health

## 2019-10-03 DIAGNOSIS — F4322 Adjustment disorder with anxiety: Secondary | ICD-10-CM | POA: Diagnosis not present

## 2019-10-03 NOTE — Progress Notes (Signed)
[facility] Psychotherapy note  Name: Rebecca Morrison Date: 10/03/2019 MRN: [id number] DOB: September 27, 1970 PCP: Howard Pouch A, DO  Time spent: 50 minutes  Treatment: individual therapy  Mental Status Exam:   Appearance:   Casual     Behavior:  Appropriate  Motor:  Normal  Speech/Language:   Clear and Coherent  Affect:  Full Range  Mood:  depressed and pleasant  Thought process:  normal  Thought content:    WNL  Sensory/Perceptual disturbances:    WNL  Orientation:  x4  Attention:  Good  Concentration:  Good  Memory:  WNL  Fund of knowledge:   Good  Insight:    Good  Judgment:   Good  Impulse Control:  developing   Reported Symptoms: Sad and anxious feelings, impulsivity, irritability, rumination, sleep disturbance  Risk Assessment: Danger to Self:  No Self-injurious Behavior: No Danger to Others: No Duty to Warn:no Physical Aggression / Violence:No  Access to Firearms a concern: No  Gang Involvement:No  Patient / guardian was educated about steps to take if suicide or homicide risk level increases between visits: yes While future psychiatric events cannot be accurately predicted, the patient does not currently require acute inpatient psychiatric care and does not currently meet [facility] involuntary commitment criteria.  Substance Abuse History: Current substance abuse: alcohol, using about 6-8 drinks most nights for the last several months. Reports hx of rehabilitative treatment in her 20's. Discussed safety planning if any detox symptoms occur (symptoms were denied at present nor any reported history of physical withdrawal). No other substances reported.  Family History:  Family History  Problem Relation Age of Onset  . Arthritis Mother   . Osteoporosis Mother        "severe"  . Cancer Sister   . Kidney cancer Sister   . Colon cancer Maternal Grandmother   . Ovarian cancer Maternal Grandmother   . Bone cancer Maternal Grandfather   . Heart disease  Maternal Grandfather   . Breast cancer Paternal Grandmother   . Rheum arthritis Maternal Great-grandmother     Medical History/Surgical History: Past Medical History:  Diagnosis Date  . Allergy   . Anemia    pt states iron deficient  . Cyst of right breast 06/19/2014   US/mamm, confirmed cyst 51mm  . Depression with anxiety 01/18/2018  . GERD (gastroesophageal reflux disease)   . Hiatal hernia    3/1//2016 Dr. Gala Lewandowsky  . Menorrhagia   . Murmur    Patient states echocardiogram was normal  . Tinnitus    ENT evaluated    Past Surgical History:  Procedure Laterality Date  . BREAST REDUCTION SURGERY Bilateral 02/2019  . COLONOSCOPY  07/10/2011   rectal bleeding, FHX. Normal colonoscopy.   . ESOPHAGOGASTRODUODENOSCOPY  03/27/2013   hiatal hernia/GERD  . REDUCTION MAMMAPLASTY      Medications: Current Outpatient Medications  Medication Sig Dispense Refill  . Cholecalciferol 25 MCG (1000 UT) tablet Take 1 tablet (1,000 Units total) by mouth daily. 90 tablet 3  . famotidine (PEPCID) 20 MG tablet Take 1 tablet by mouth daily as needed.    . Glucosamine-Chondroit-Vit C-Mn (GLUCOSAMINE CHONDR 500 COMPLEX PO) Take 1 tablet by mouth as needed.     . Omega-3 Fatty Acids (FISH OIL) 1000 MG CAPS Take 1 capsule by mouth daily.    . vitamin B-12 (V-R VITAMIN B-12) 500 MCG tablet Take 1 tablet (500 mcg total) by mouth daily. 90 tablet 3  . Zinc 15 MG CAPS Take 2 capsules by  mouth daily.     No current facility-administered medications for this visit.    Family History:  Family History  Problem Relation Age of Onset  . Arthritis Mother   . Osteoporosis Mother        "severe"  . Cancer Sister   . Kidney cancer Sister   . Colon cancer Maternal Grandmother   . Ovarian cancer Maternal Grandmother   . Bone cancer Maternal Grandfather   . Heart disease Maternal Grandfather   . Breast cancer Paternal Grandmother   . 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  Continue to assess completing the second part of the initial assessment.  She identified areas of change needed such as her starting to exercise more consistently.  She stated she struggles in part due to knee and hip problems where she receives injections every few months to relieve some pain.  She stated that she cannot engage in cardiovascular exercise such as running due to these problems.  She stated that she needs a group, go to the gym to keep motivated and consistent and plans on look on looking into these possibilities.  She shared how she has anxiety around getting shots and we discussed some ways to cope, primarily thought blocking, an example is to engage in discussion with the nurse during the procedure.  She struggles with organization and task completion, easily distracted often.  She stated this is been present for many years and has been 1 source of distress in her relationship with her husband as he is very organized and orderly.  She said they continue to have marital strain.  Time was spent to allow her to provide some details and process feelings related.  She stated that  she wants to be a "kind person like my mom".  She stated that her mother is very kind but also can be taken advantage of.  She struggles to strike the balance of being kind but not perceived as weak.  Provided some psychoeducation related to communication changes that can allow for changes in meeting these needs.  She stated that she often gets upset and tries to "1 up" her husband when they have disagreements and arguments.  Plan was made for her to begin taking steps toward exercise as well as for her to limit her alcohol consumption as she stated that she continues to drink most days.  She stated she follow through with resource given last session and plans to continue to utilize.  Also provided resource regarding ADHD and discussed scheduling for psychiatric evaluation.  Interventions: Assessment, CBT, supportive therapy, problem solving  Diagnoses:    ICD-10-CM  1. Adjustment disorder with anxious mood  F43.22    r/o ADHD  Plan: Patient is to use CBT, mindfulness and coping skills to help manage decrease symptoms associated with their diagnosis.      Long-term goal:   Reduce overall level, frequency, and intensity of the feelings of depression and anxiety up to at least 80% of the time in severity for at least 3 consecutive months as reported by patient   Short-term goal:  Start working [employer] and task completion consistency Improved communication skills to improve her relationships   Assessment of progress:  progressing   Anson [place], [facility]

## 2019-10-17 ENCOUNTER — Ambulatory Visit (INDEPENDENT_AMBULATORY_CARE_PROVIDER_SITE_OTHER): Payer: 59 | Admitting: Mental Health

## 2019-10-17 ENCOUNTER — Other Ambulatory Visit: Payer: Self-pay

## 2019-10-17 DIAGNOSIS — F4322 Adjustment disorder with anxiety: Secondary | ICD-10-CM

## 2019-10-17 NOTE — Progress Notes (Signed)
[facility] Psychotherapy note  Name: Thandiwe Siragusa Date: 10/17/19 MRN: [id number] DOB: [birth year] PCP: Howard Pouch A, DO  Time spent: 50 minutes  Treatment: individual therapy  Mental Status Exam:   Appearance:   Casual     Behavior:  Appropriate  Motor:  Normal  Speech/Language:   Clear and Coherent  Affect:  Full Range  Mood:  depressed and pleasant  Thought process:  normal  Thought content:    WNL  Sensory/Perceptual disturbances:    WNL  Orientation:  x4  Attention:  Good  Concentration:  Good  Memory:  WNL  Fund of knowledge:   Good  Insight:    Good  Judgment:   Good  Impulse Control:  developing   Reported Symptoms: Sad and anxious feelings, impulsivity, irritability, rumination, sleep disturbance  Risk Assessment: Danger to Self:  No Self-injurious Behavior: No Danger to Others: No Duty to Warn:no Physical Aggression / Violence:No  Access to Firearms a concern: No  Gang Involvement:No  Patient / guardian was educated about steps to take if suicide or homicide risk level increases between visits: yes While future psychiatric events cannot be accurately predicted, the patient does not currently require acute inpatient psychiatric care and does not currently meet [facility] involuntary commitment criteria.  Subjective:  Patient presents for session in no distress.  She shared how today's the anniversary of her marriage and she plans to have lunch with her husband.  She shared how she has worked to reduce some of her alcohol consumption following through with resource given last session.  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  She is considering options, joining a salon via contract or opening her own space.  She identified the need to follow through in this area to further define her purpose outside of being focused on her children's names as she has done  primarily for many years.  Assisted her in identifying hopeful, optimistic thoughts about her efforts in taking the steps where she also wants to follow through with beginning exercise as discussed last session.  She shared how some of her marital strain has improved recently as she continues to work on how she is responding.  Interventions: Assessment, CBT, supportive therapy, problem solving  Diagnoses:    ICD-10-CM   1. Adjustment disorder with anxious mood  F43.22    r/o ADHD  Plan: Patient is to use CBT, mindfulness and coping skills to help manage decrease symptoms associated with their diagnosis.      Long-term goal:   Reduce overall level, frequency, and intensity of the feelings of depression and anxiety up to at least 80% of the time in severity for at least 3 consecutive months as reported by patient   Short-term goal:  Start working [employer] and task completion consistency Improved communication skills to improve her relationships   Assessment of progress:  progressing   [place], [facility]

## 2019-10-19 IMAGING — MG DIGITAL SCREENING BILATERAL MAMMOGRAM WITH TOMO AND CAD
8 series · 8 of 24 positions shown · non-contrast
Comparison: Previous exam(s).

CLINICAL DATA: Screening.

EXAM:
DIGITAL SCREENING BILATERAL MAMMOGRAM WITH TOMO AND CAD

[R CC synth-2D]
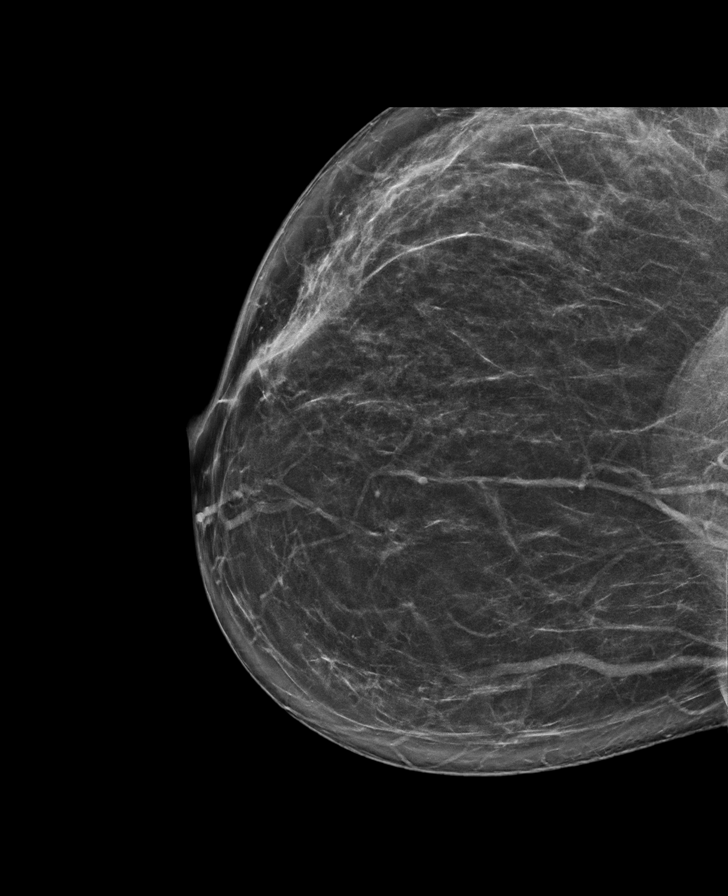

[L MLO synth-2D]
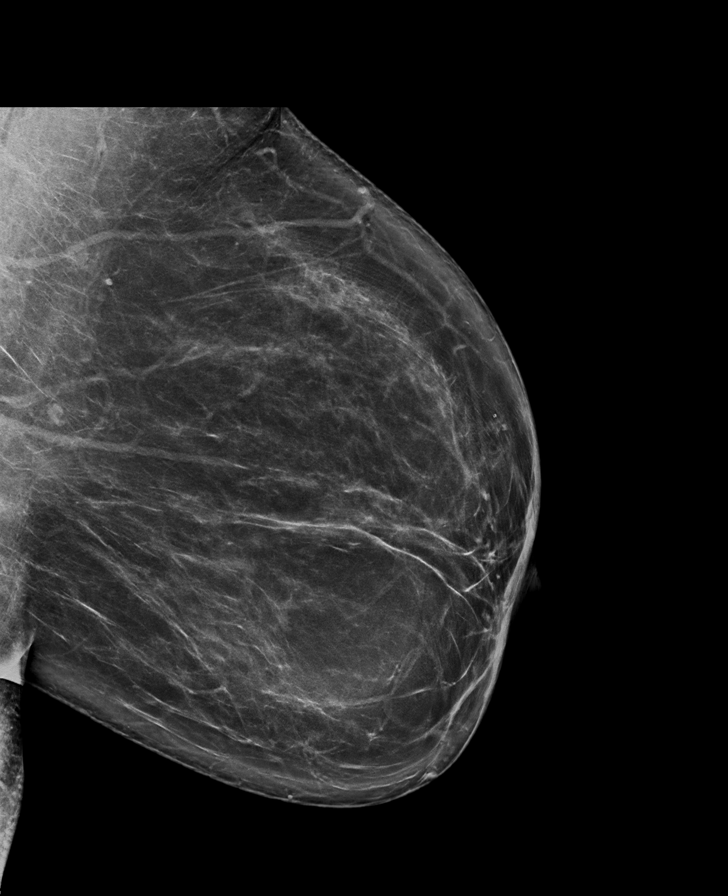

[R MLO synth-2D]
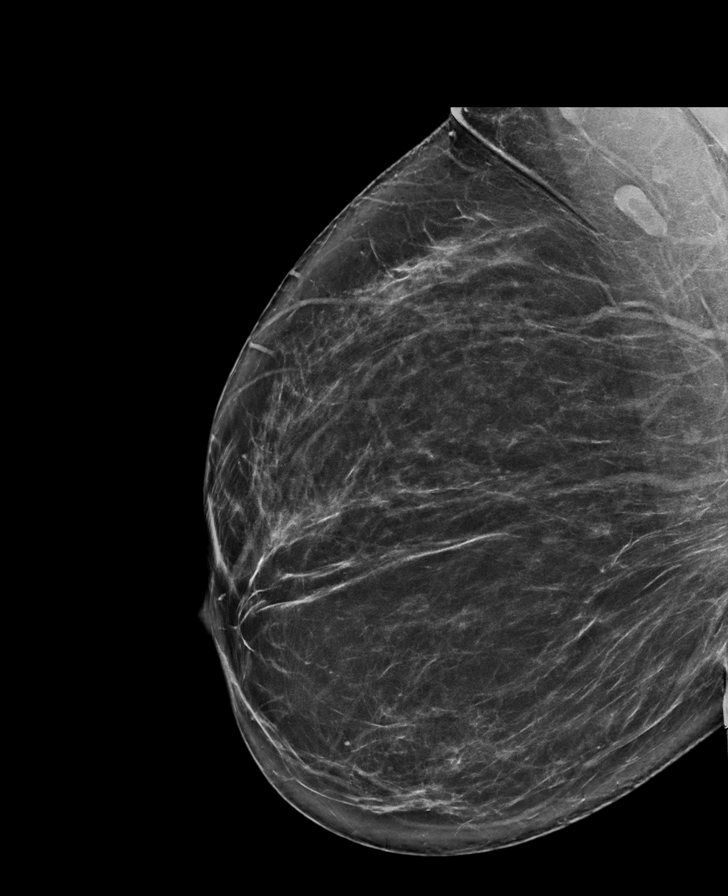

[L CC synth-2D]
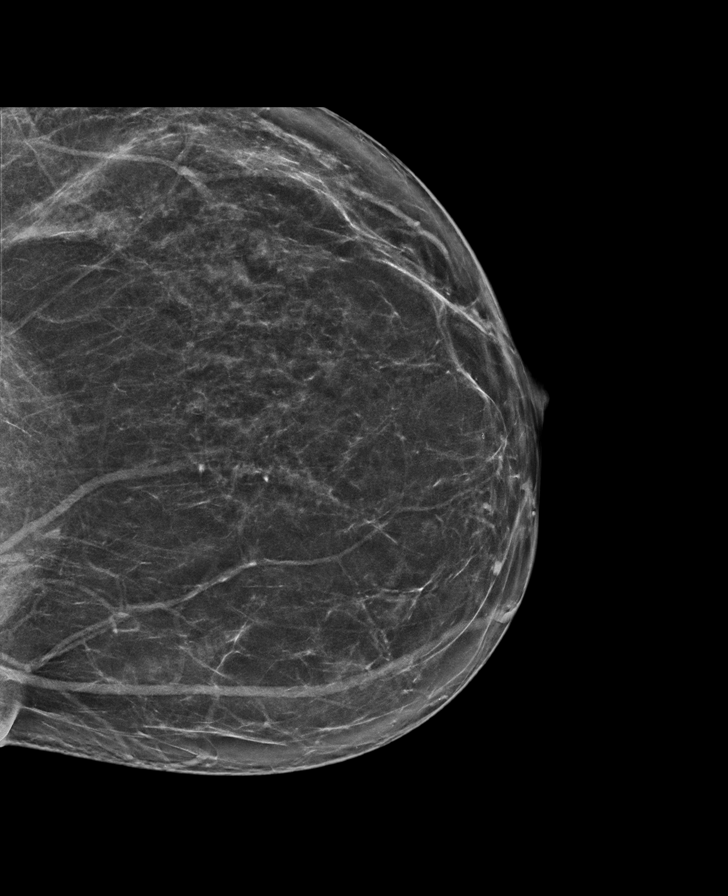

[R CC tomo · tomo slice 39/76.0]
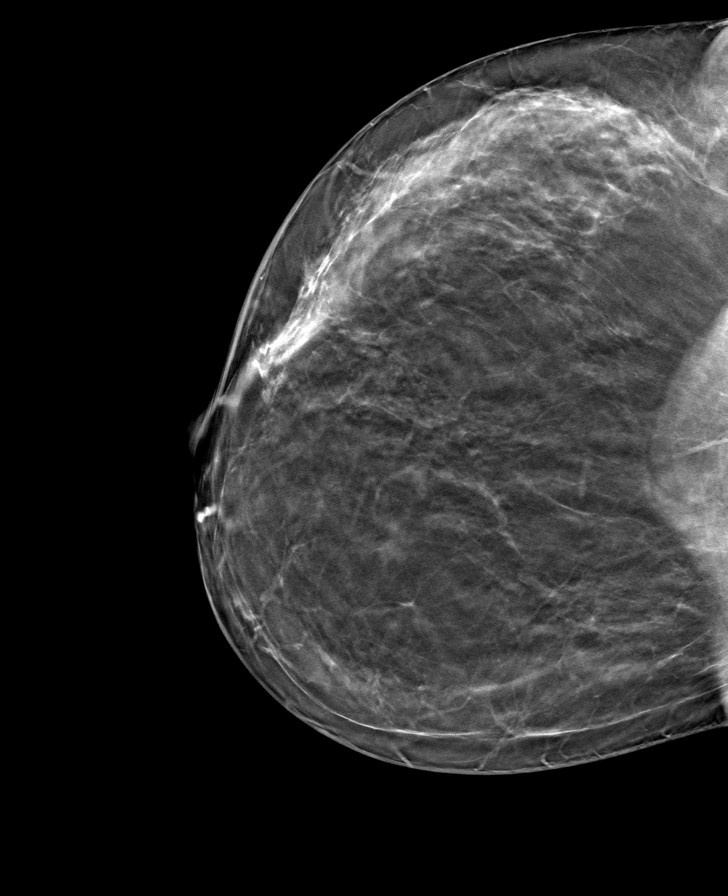

[L CC tomo · tomo slice 39/76.0]
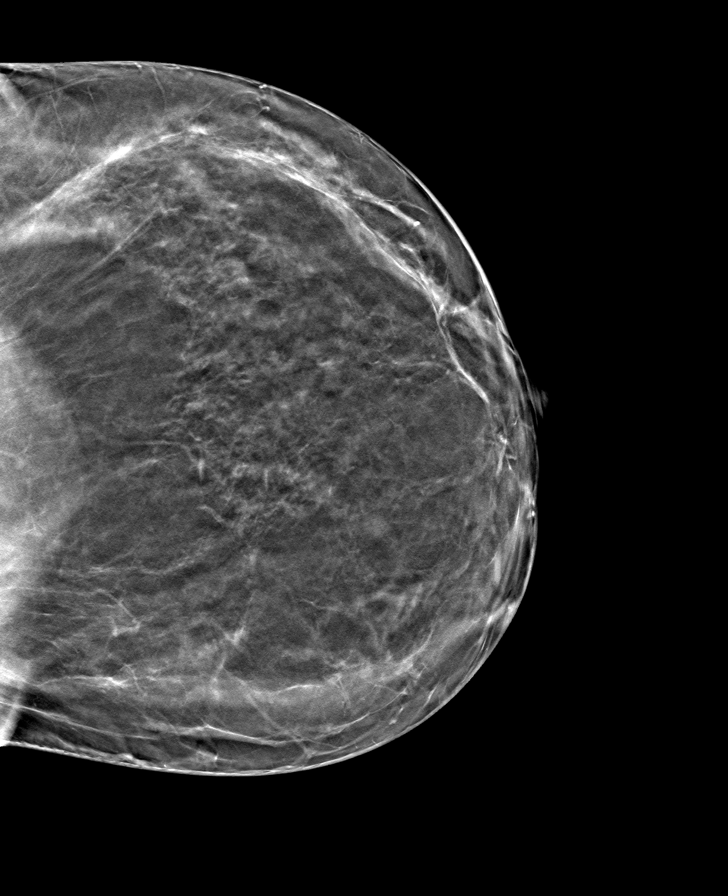

[R MLO tomo · tomo slice 44/87.0]
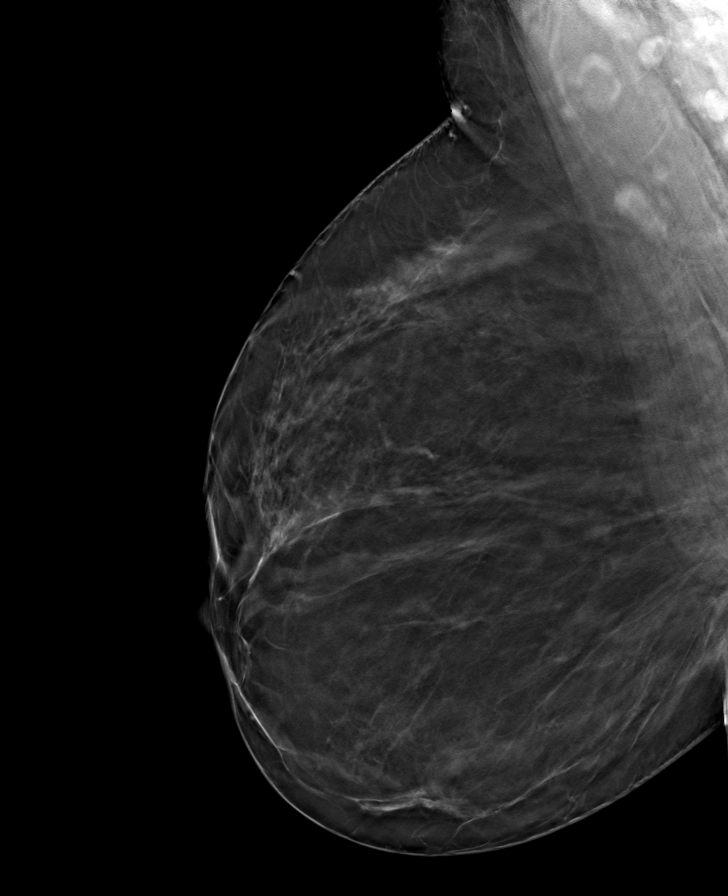

[L MLO tomo · tomo slice 47/93.0]
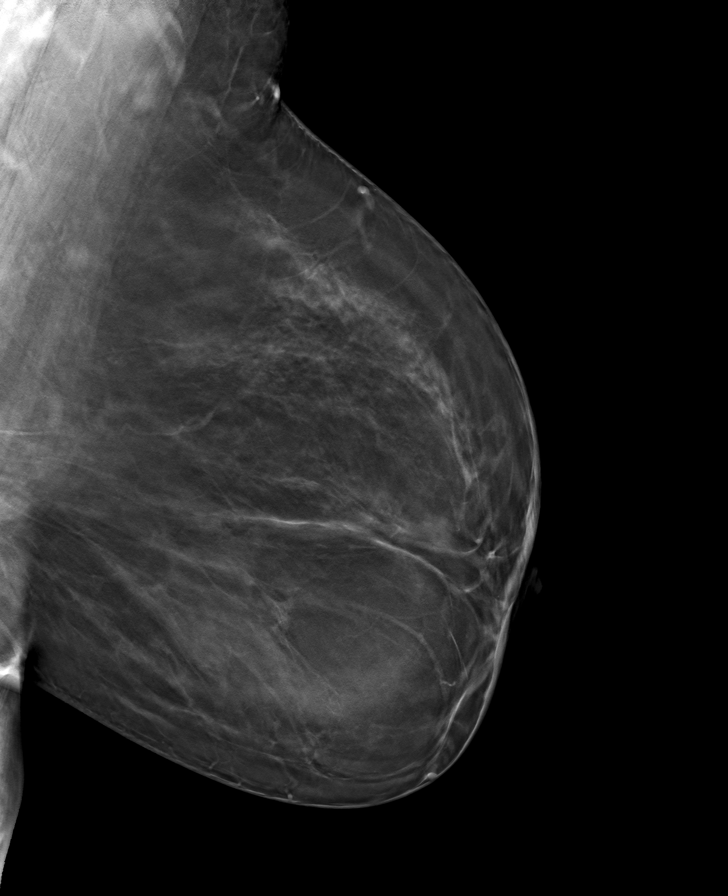

[8 of 24 positions shown; findings below may reference images not displayed]

ACR Breast Density Category b: There are scattered areas of
fibroglandular density.
FINDINGS: There are no findings suspicious for malignancy. Images were
processed with CAD.
IMPRESSION: No mammographic evidence of malignancy. A result letter of this
screening mammogram will be mailed directly to the patient.

RECOMMENDATION:
Screening mammogram in one year. (Code:[id number])

BI-RADS CATEGORY  1: Negative.

## 2019-10-20 ENCOUNTER — Ambulatory Visit: Payer: 59 | Admitting: Adult Health

## 2019-11-07 ENCOUNTER — Ambulatory Visit: Payer: 59 | Admitting: Mental Health

## 2019-11-10 ENCOUNTER — Ambulatory Visit (INDEPENDENT_AMBULATORY_CARE_PROVIDER_SITE_OTHER): Payer: 59 | Admitting: Adult Health

## 2019-11-10 ENCOUNTER — Encounter: Payer: Self-pay | Admitting: Adult Health

## 2019-11-10 ENCOUNTER — Other Ambulatory Visit: Payer: Self-pay

## 2019-11-10 DIAGNOSIS — F331 Major depressive disorder, recurrent, moderate: Secondary | ICD-10-CM | POA: Diagnosis not present

## 2019-11-10 DIAGNOSIS — F411 Generalized anxiety disorder: Secondary | ICD-10-CM | POA: Diagnosis not present

## 2019-11-10 DIAGNOSIS — F428 Other obsessive-compulsive disorder: Secondary | ICD-10-CM | POA: Diagnosis not present

## 2019-11-10 DIAGNOSIS — F909 Attention-deficit hyperactivity disorder, unspecified type: Secondary | ICD-10-CM

## 2019-11-10 MED ORDER — FLUOXETINE HCL 10 MG PO CAPS: 10.0000 mg | ORAL_CAPSULE | Freq: Every day | ORAL | 2 refills | Status: DC

## 2019-11-10 NOTE — Progress Notes (Signed)
[facility] MD/PA/NP Initial Note  11/10/2019 2:27 PM Rebecca Morrison  MRN:  [id number]  Chief Complaint:   HPI:  Describes mood today as "so-so". Tearful "sometime, not a big cryer". Pleasant. Mood symptoms - reports depression, anxiety, and irritability. Symptoms worse since moving to [place] 5 years ago. Worries "a lot about things". Stating "I wondered if I cut my curling iron off today". Worries. Wakes up at 2 am and wonders if husband locked the door. Has intrusive thoughts that "pop into my head". Likes things in order. Wants things put back where they are supposed to be. Can be "OCD" about things. Has to put away groceries so that labels are facing forward. Certain things have to be in certain places. Drawers in order. Clothes colored coordinated by season in her closet. Stable interest and motivation. Involved in counseling. Taking medications as prescribed.  Energy levels "terrible". Active, does not have a regular exercise routine. Walking around the neighborhood. Has a home gym, but does not utilize it.  Enjoys some usual interests and activities. Married. Lives with husband of 57 years and 2 sons 33 and 67. No family local. Moved to [place] from [place] in 2016 - husband's job. Spending time with family.Active in church.  Appetite adequate. Weight gain - 173 pounds. Sleeping difficulties - husband walks and talks in sleep. Sleeping on the couch some nights. Averages 8 hours. Focus and concentration dificulties. Difficulties staying on task. Has completed online testing and feels she may be ADHD. Completing tasks. Managing aspects of household. 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  Denies SI or HI.  Denies AH or VH. Drinks alcohol - trying to not self medicate. May have one to six beers at night. Has decreased use over the past month. Graduated - GED. 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 Grades terrible in high school.  Fear of bugs.  Previous medication trials: Paxil 1990 up until  2003, Stratera, Wellbutrin, SSRI.   Visit Diagnosis:    ICD-10-CM   1. Generalized anxiety disorder  F41.1 FLUoxetine (PROZAC) 10 MG capsule  2. Major depressive disorder, recurrent episode, moderate (HCC)  F33.1 FLUoxetine (PROZAC) 10 MG capsule  3. Obsessional thoughts  F42.8 FLUoxetine (PROZAC) 10 MG capsule  4. Attention deficit hyperactivity disorder (ADHD), unspecified ADHD type  F90.9     Past Psychiatric History: Drug and alcohol treatment at ages 76 ad 53.   Past Medical History:  Past Medical History:  Diagnosis Date  . Allergy   . Anemia    pt states iron deficient  . Cyst of right breast 06/19/2014   US/mamm, confirmed cyst 4mm  . Depression with anxiety 01/18/2018  . GERD (gastroesophageal reflux disease)   . Hiatal hernia    3/1//2016 Dr. Gala Lewandowsky  . Menorrhagia   . Murmur    Patient states echocardiogram was normal  . Tinnitus    ENT evaluated    Past Surgical History:  Procedure Laterality Date  . BREAST REDUCTION SURGERY Bilateral 02/2019  . COLONOSCOPY  07/10/2011   rectal bleeding, FHX. Normal colonoscopy.   . ESOPHAGOGASTRODUODENOSCOPY  03/27/2013   hiatal hernia/GERD  . REDUCTION MAMMAPLASTY      Family Psychiatric History: Grandfather - bipolar. Sister - depressed.   Family History:  Family History  Problem Relation Age of Onset  . Arthritis Mother   . Osteoporosis Mother        "severe"  . Cancer Sister   . Kidney cancer Sister   . Colon cancer Maternal Grandmother   .  Ovarian cancer Maternal Grandmother   . Bone cancer Maternal Grandfather   . Heart disease Maternal Grandfather   . Breast cancer Paternal Grandmother   . Rheum arthritis Maternal Great-grandmother     Social History:  Social History   Socioeconomic History  . Marital status: Married    Spouse name: Not on file  . Number of children: 2  . Years of education: Not on file  . Highest education level: Not on file  Occupational History  . Not on file  Tobacco Use  .  Smoking status: Former Research scientist (life sciences)  . Smokeless tobacco: Never Used  Vaping Use  . Vaping Use: Never used  Substance and Sexual Activity  . Alcohol use: Yes    Alcohol/week: 5.0 - 6.0 standard drinks    Types: 5 - 6 Glasses of wine per week  . Drug use: No  . Sexual activity: Yes    Birth control/protection: Surgical    Comment: partner-vasectomy  Other Topics Concern  . Not on file  Social History Narrative   Married, husband [name]. 2 children [name] in [place].   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   Former smoker, quit 2003.   Social alcohol use, no drug use.   Drinks caffeinated beverages, uses herbal remedies, takes a daily vitamin.   Wears her seatbelt, wears a bicycle helmet, exercises at least 3 times a week.   Smoke detector in the home, feels safe in her relationships.   Social Determinants of Health   Financial Resource Strain:   . Difficulty of Paying Living Expenses: Not on file  Food Insecurity:   . Worried About Charity fundraiser in the Last Year: Not on file  . Ran Out of Food in the Last Year: Not on file  Transportation Needs:   . Lack of Transportation (Medical): Not on file  . Lack of Transportation (Non-Medical): Not on file  Physical Activity:   . Days of Exercise per Week: Not on file  . Minutes of Exercise per Session: Not on file  Stress:   . Feeling of Stress : Not on file  Social Connections:   . Frequency of Communication with Friends and Family: Not on file  . Frequency of Social Gatherings with Friends and Family: Not on file  . Attends Religious Services: Not on file  . Active Member of Clubs or Organizations: Not on file  . Attends Archivist Meetings: Not on file  . Marital Status: Not on file    Allergies: No Known Allergies  Metabolic Disorder Labs: Lab Results  Component Value Date   HGBA1C 5.0 03/31/2019   MPG 94 01/18/2018   MPG 88 07/07/2016   No results found for:  PROLACTIN Lab Results  Component Value Date   CHOL 194 03/31/2019   TRIG 53.0 03/31/2019   HDL 49.60 03/31/2019   CHOLHDL 4 03/31/2019   VLDL 10.6 03/31/2019   LDLCALC 134 (H) 03/31/2019   LDLCALC 93 01/18/2018   Lab Results  Component Value Date   TSH 1.45 01/10/2019   TSH 2.10 01/18/2018    Therapeutic Level Labs: No results found for: LITHIUM No results found for: VALPROATE No components found for:  CBMZ  Current Medications: Current Outpatient Medications  Medication Sig Dispense Refill  . Cholecalciferol 25 MCG (1000 UT) tablet Take 1 tablet (1,000 Units total) by mouth daily. 90 tablet 3  . famotidine (PEPCID) 20 MG tablet Take 1 tablet by mouth daily as needed.    [place]  FLUoxetine (PROZAC) 10 MG capsule Take 1 capsule (10 mg total) by mouth daily. 30 capsule 2  . Glucosamine-Chondroit-Vit C-Mn (GLUCOSAMINE CHONDR 500 COMPLEX PO) Take 1 tablet by mouth as needed.     . Omega-3 Fatty Acids (FISH OIL) 1000 MG CAPS Take 1 capsule by mouth daily.    . vitamin B-12 (V-R VITAMIN B-12) 500 MCG tablet Take 1 tablet (500 mcg total) by mouth daily. 90 tablet 3  . Zinc 15 MG CAPS Take 2 capsules by mouth daily.     No current facility-administered medications for this visit.    Medication Side Effects: none  Orders placed this visit:  No orders of the defined types were placed in this encounter.   Psychiatric Specialty Exam:  Review of Systems  There were no vitals taken for this visit.There is no height or weight on file to calculate BMI.  General Appearance: Neat and Well Groomed  Eye Contact:  Good  Speech:  Clear and Coherent and Normal Rate  Volume:  Normal  Mood:  Anxious, Depressed and Irritable  Affect:  Appropriate and Congruent  Thought Process:  Coherent and Descriptions of Associations: Intact  Orientation:  Full (Time, Place, and Person)  Thought Content: Logical   Suicidal Thoughts:  No  Homicidal Thoughts:  No  Memory:  WNL  Judgement:  Good  Insight:   Good  Psychomotor Activity:  Normal  Concentration:  Concentration: Good  Recall:  Good  Fund of Knowledge: Good  Language: Good  Assets:  Communication Skills Desire for Improvement Financial Resources/Insurance Housing Intimacy Leisure Time Physical Health Resilience Social Support Talents/Skills Transportation Vocational/Educational  ADL's:  Intact  Cognition: WNL  Prognosis:  Good   Screenings:  GAD-7     Office Visit from 01/18/2018 in [facility] Visit from 10/30/2015 in [facility]  Total GAD-7 Score 19 14    PHQ2-9     Office Visit from 03/31/2019 in [facility] Visit from 01/18/2018 in [facility] Visit from 03/27/2017 in [place] Visit from 07/07/2016 in [place] Visit from 10/30/2015 in [place]  PHQ-2 Total Score 0 2 0 0 3  PHQ-9 Total Score -- 16 -- -- 14      Receiving Psychotherapy: Yes   Treatment Plan/Recommendations:   Plan:  PDMP reviewed  1. Add Prozac 10mg  daily  Read and reviewed note with patient for accuracy.   RTC 4 weeks  Patient advised to contact office with any questions, adverse effects, or acute worsening in signs and symptoms.   Aloha Gell, NP

## 2019-11-15 ENCOUNTER — Other Ambulatory Visit: Payer: Self-pay

## 2019-11-15 ENCOUNTER — Ambulatory Visit (INDEPENDENT_AMBULATORY_CARE_PROVIDER_SITE_OTHER): Payer: 59 | Admitting: Mental Health

## 2019-11-15 DIAGNOSIS — F411 Generalized anxiety disorder: Secondary | ICD-10-CM | POA: Diagnosis not present

## 2019-11-15 NOTE — Progress Notes (Signed)
[facility] Counselor Psychotherapy note  Name: Rebecca Morrison Date: 11/15/19 MRN: [id number] DOB: Aug 20, 1970 PCP: Howard Pouch A, DO  Time spent: 51 minutes  Treatment: individual therapy  Mental Status Exam:   Appearance:   Casual     Behavior:  Appropriate  Motor:  Normal  Speech/Language:   Clear and Coherent  Affect:  Full Range  Mood:  depressed and pleasant  Thought process:  normal  Thought content:    WNL  Sensory/Perceptual disturbances:    WNL  Orientation:  x4  Attention:  Good  Concentration:  Good  Memory:  WNL  Fund of knowledge:   Good  Insight:    Good  Judgment:   Good  Impulse Control:  developing   Reported Symptoms: Sad and anxious feelings, impulsivity, irritability, rumination, sleep disturbance  Risk Assessment: Danger to Self:  No Self-injurious Behavior: No Danger to Others: No Duty to Warn:no Physical Aggression / Violence:No  Access to Firearms a concern: No  Gang Involvement:No  Patient / guardian was educated about steps to take if suicide or homicide risk level increases between visits: yes While future psychiatric events cannot be accurately predicted, the patient does not currently require acute inpatient psychiatric care and does not currently meet [facility] involuntary commitment criteria.  Subjective:  Patient presents for session on time.  She shared recent changes, progress.  She stated her husband contracted COVID-19 and is close to being fully recovered.  She stated that she has not contracted the virus nor have her sons.  She expressed concerns related to her husband getting the vaccine as she stated she recently learned that was required of him.  She worries that he may have a reaction and it affect possibly for the rest of his life.  We explore calming statements to keep her anxiety managed.  Explored their marital relationship where she continues to try and work on her communication.  She identified feelings of frustration that  persist, feels that her husband exerts a level of control in the relationship particularly around finances, she stated that one of her friends passed away recently and she bought a plane ticket to attend the funeral, she stated her husband was supportive however question her when she got back regarding the amount of money that take it because although he was fully aware prior to her leaving.  She stated this is common, typically every month he will make statements about spending.  She says she does not have excessive spending habits, makes purchases needed for the household and for family most often.  She identified one of her closer supports who is a close friend that lives in her neighborhood with whom she can vent feelings at times.  She says she plans to engage in testing for ADHD as she was referred to the [place] [facility].  She plans to follow-up with her next medication management appointment with Deloria Lair, NP.   Interventions:  CBT, supportive therapy, problem solving  Diagnoses:    ICD-10-CM   1. Generalized anxiety disorder  F41.1    r/o ADHD  Plan: Patient is to use CBT, mindfulness and coping skills to help manage decrease symptoms associated with their diagnosis.      Long-term goal:   Reduce overall level, frequency, and intensity of the feelings of depression and anxiety up to at least 80% of the time in severity for at least 3 consecutive months as reported by patient   Short-term goal:  Start working [employer] and  task completion consistency Improved communication skills to improve her relationships   Assessment of progress:  progressing   [place], [facility]

## 2019-11-28 ENCOUNTER — Encounter: Payer: Self-pay | Admitting: Family Medicine

## 2019-11-29 ENCOUNTER — Encounter: Payer: Self-pay | Admitting: Family Medicine

## 2019-11-29 ENCOUNTER — Ambulatory Visit (INDEPENDENT_AMBULATORY_CARE_PROVIDER_SITE_OTHER): Payer: 59 | Admitting: Family Medicine

## 2019-11-29 ENCOUNTER — Other Ambulatory Visit: Payer: Self-pay

## 2019-11-29 VITALS — BP 131/86 | HR 70 | Temp 98.6°F | Ht 63.0 in | Wt 176.0 lb

## 2019-11-29 DIAGNOSIS — L659 Nonscarring hair loss, unspecified: Secondary | ICD-10-CM

## 2019-11-29 DIAGNOSIS — R1011 Right upper quadrant pain: Secondary | ICD-10-CM

## 2019-11-29 NOTE — Patient Instructions (Signed)
Fatty Liver Disease  Fatty liver disease occurs when too much fat has built up in your liver cells. Fatty liver disease is also called hepatic steatosis or steatohepatitis. The liver removes harmful substances from your bloodstream and produces fluids that your body needs. It also helps your body use and store energy from the food you eat. In many cases, fatty liver disease does not cause symptoms or problems. It is often diagnosed when tests are being done for other reasons. However, over time, fatty liver can cause inflammation that may lead to more serious liver problems, such as scarring of the liver (cirrhosis) and liver failure. Fatty liver is associated with insulin resistance, increased body fat, high blood pressure (hypertension), and high cholesterol. These are features of metabolic syndrome and increase your risk for stroke, diabetes, and heart disease. What are the causes? This condition may be caused by:  Drinking too much alcohol.  Poor nutrition.  Obesity.  Cushing's syndrome.  Diabetes.  High cholesterol.  Certain drugs.  Poisons.  Some viral infections.  Pregnancy. What increases the risk? You are more likely to develop this condition if you:  Abuse alcohol.  Are overweight.  Have diabetes.  Have hepatitis.  Have a high triglyceride level.  Are pregnant. What are the signs or symptoms? Fatty liver disease often does not cause symptoms. If symptoms do develop, they can include:  Fatigue.  Weakness.  Weight loss.  Confusion.  Abdominal pain.  Nausea and vomiting.  Yellowing of your skin and the white parts of your eyes (jaundice).  Itchy skin. How is this diagnosed? This condition may be diagnosed by:  A physical exam and medical history.  Blood tests.  Imaging tests, such as an ultrasound, CT scan, or MRI.  A liver biopsy. A small sample of liver tissue is removed using a needle. The sample is then looked at under a microscope. How  is this treated? Fatty liver disease is often caused by other health conditions. Treatment for fatty liver may involve medicines and lifestyle changes to manage conditions such as:  Alcoholism.  High cholesterol.  Diabetes.  Being overweight or obese. Follow these instructions at home:   Do not drink alcohol. If you have trouble quitting, ask your health care provider how to safely quit with the help of medicine or a supervised program. This is important to keep your condition from getting worse.  Eat a healthy diet as told by your health care provider. Ask your health care provider about working with a diet and nutrition specialist (dietitian) to develop an eating plan.  Exercise regularly. This can help you lose weight and control your cholesterol and diabetes. Talk to your health care provider about an exercise plan and which activities are best for you.  Take over-the-counter and prescription medicines only as told by your health care provider.  Keep all follow-up visits as told by your health care provider. This is important. Contact a health care provider if: You have trouble controlling your:  Blood sugar. This is especially important if you have diabetes.  Cholesterol.  Drinking of alcohol. Get help right away if:  You have abdominal pain.  You have jaundice.  You have nausea and vomiting.  You vomit blood or material that looks like coffee grounds.  You have stools that are black, tar-like, or bloody. Summary  Fatty liver disease develops when too much fat builds up in the cells of your liver.  Fatty liver disease often causes no symptoms or problems. However, over   time, fatty liver can cause inflammation that may lead to more serious liver problems, such as scarring of the liver (cirrhosis).  You are more likely to develop this condition if you abuse alcohol, are pregnant, are overweight, have diabetes, have hepatitis, or have high triglyceride  levels.  Contact your health care provider if you have trouble controlling your weight, blood sugar, cholesterol, or drinking of alcohol. This information is not intended to replace advice given to you by your health care provider. Make sure you discuss any questions you have with your health care provider. Document Revised: 01/23/2017 Document Reviewed: 11/19/2016 Elsevier Patient Education  2020 [employer]  

## 2019-11-29 NOTE — Progress Notes (Signed)
This visit occurred during the SARS-CoV-2 public health emergency.  Safety protocols were in place, including screening questions prior to the visit, additional usage of staff PPE, and extensive cleaning of exam room while observing appropriate contact time as indicated for disinfecting solutions.    Rebecca Morrison , Mar 17, 1970, 49 y.o., female MRN: [id number] Patient Care Team    Relationship Specialty Notifications Start End  [name], DO PCP - General Family Medicine  09/03/15   Esaw Dace, MD Referring Physician Gastroenterology  03/31/19   Darrin Luis, MD Referring Physician Plastic Surgery  03/31/19   Huel Cote, NP (Inactive) Nurse Practitioner Obstetrics and Gynecology  03/31/19     Chief Complaint  Patient presents with   Abdominal Pain    pt c/o RUQ pain that is aggravated with twisting and bending; pt describe pain as a dull pain that hurts more at night, pt do lay on right side x 3 mos; PMHx gallbladder issues     Subjective: Pt presents for an OV with complaints of right-sided abdominal discomfort.  Patient has had right upper quadrant discomfort in the past and gallbladder studies have been completed which resulted with a gallbladder polyp that has been stable.  She is established with gastroenterology, digestive health specialist and has had up-to-date colon cancer screenings and an EGD.  She underwent a breast reduction in January and reports she has been healing well but still is not back to her daily routine exercise.  She has noticed more frequent and worsening right upper quadrant pain over the last 3 months that is worse when laying on her right side and is aggravated when twisting, bending and muscle skeletal movements.  She also has noticed food triggers which cause her stomach to hurt which include dairy and fatty foods.  She states high fiber content foods are also difficult to breakdown and make her gassy.  She feels she has had hair thinning and loss  over the last few months as well.  She has a history of iron deficiency.  She is currently not taking iron.  She is taking her B12 and her vitamin D.  Depression screen [facility] 2/9 11/29/2019 03/31/2019 01/18/2018 01/18/2018 03/27/2017  Decreased Interest 0 0 1 0 0  Down, Depressed, Hopeless 0 0 1 0 0  PHQ - 2 Score 0 0 2 0 0  Altered sleeping - - 3 - -  Tired, decreased energy - - 3 - -  Change in appetite - - 3 - -  Feeling bad or failure about yourself  - - 1 - -  Trouble concentrating - - 1 - -  Moving slowly or fidgety/restless - - 3 - -  Suicidal thoughts - - 0 - -  PHQ-9 Score - - 16 - -  Difficult doing work/chores - - Somewhat difficult - -    No Known Allergies Social History   Social History Narrative   Married, husband Psychologist, clinical. 2 children [name] in [place].   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   Former smoker, quit 2003.   Social alcohol use, no drug use.   Drinks caffeinated beverages, uses herbal remedies, takes a daily vitamin.   Wears her seatbelt, wears a bicycle helmet, exercises at least 3 times a week.   Smoke detector in the home, feels safe in her relationships.   Past Medical History:  Diagnosis Date   Allergy    Anemia    pt states iron deficient  Cyst of right breast 06/19/2014   US/mamm, confirmed cyst 101mm   Depression with anxiety 01/18/2018   GERD (gastroesophageal reflux disease)    Hiatal hernia    3/1//2016 Dr. Gala Lewandowsky   Menorrhagia    Murmur    Patient states echocardiogram was normal   Tinnitus    ENT evaluated   Past Surgical History:  Procedure Laterality Date   BREAST REDUCTION SURGERY Bilateral 02/2019   COLONOSCOPY  07/10/2011   rectal bleeding, FHX. Normal colonoscopy.    ESOPHAGOGASTRODUODENOSCOPY  03/27/2013   hiatal hernia/GERD   Family History  Problem Relation Age of Onset   Arthritis Mother    Osteoporosis Mother        "severe"   Cancer Sister    Kidney cancer  Sister    Colon cancer Maternal Grandmother    Ovarian cancer Maternal Grandmother    Bone cancer Maternal Grandfather    Heart disease Maternal Grandfather    Breast cancer Paternal Grandmother    Rheum arthritis Maternal Great-grandmother    Allergies as of 11/29/2019   No Known Allergies     Medication List       Accurate as of November 29, 2019 11:59 PM. If you have any questions, ask your nurse or doctor.        STOP taking these medications   Fish Oil 1000 MG Caps Stopped by: Howard Pouch, DO     TAKE these medications   Cholecalciferol 25 MCG (1000 UT) tablet Take 1 tablet (1,000 Units total) by mouth daily.   FLUoxetine 10 MG capsule Commonly known as: PROzac Take 1 capsule (10 mg total) by mouth daily.   GLUCOSAMINE CHONDR 500 COMPLEX PO Take 1 tablet by mouth as needed.   Pepcid 20 MG tablet Generic drug: famotidine Take 1 tablet by mouth daily as needed.   Poly-Iron 150 Forte 150-0.025-1 MG Caps Generic drug: Iron Polysacch Cmplx-B12-FA 1 capsule PO every other day. Started by: Howard Pouch, DO   vitamin B-12 500 MCG tablet Commonly known as: V-R VITAMIN B-12 Take 1 tablet (500 mcg total) by mouth daily.   Zinc 15 MG Caps Take 2 capsules by mouth daily.       All past medical history, surgical history, allergies, family history, immunizations andmedications were updated in the EMR today and reviewed under the history and medication portions of their EMR.     ROS: Negative, with the exception of above mentioned in HPI   Objective:  BP 131/86    Pulse 70    Temp 98.6 F (37 C) (Oral)    Ht $R'5\' 3"'sY$  (1.6 m)    Wt 176 lb (79.8 kg)    SpO2 97%    BMI 31.18 kg/m  Body mass index is 31.18 kg/m. Gen: Afebrile. No acute distress. Nontoxic in appearance, well developed, well nourished.  HENT: AT. [place].  Eyes:Pupils Equal Round Reactive to light, Extraocular movements intact,  Conjunctiva without redness, discharge or icterus. Neck/lymp/endocrine:  Supple, no lymphadenopathy CV: RRR  Chest: CTAB, no wheeze or crackles.  Abd: Soft.  Flat. ND, mild tenderness lateral right upper quadrant and were just below rib cage. BS present.  No masses palpated. No rebound or guarding.  Negative Murphy sign Skin: no rashes, purpura or petechiae.  Neuro:  Normal gait. PERLA. EOMi. Alert. Oriented x3  Psych: Normal affect, dress and demeanor. Normal speech. Normal thought content and judgment.  No exam data present No results found. Results for orders placed or performed in visit on  11/29/19 (from the past 24 hour(s))  Comp Met (CMET)     Status: None   Collection Time: 11/29/19  3:41 PM  Result Value Ref Range   Glucose, Bld 78 65 - 99 mg/dL   BUN 14 7 - 25 mg/dL   Creat 0.78 0.50 - 1.10 mg/dL   BUN/Creatinine Ratio NOT APPLICABLE 6 - 22 (calc)   Sodium 140 135 - 146 mmol/L   Potassium 4.2 3.5 - 5.3 mmol/L   Chloride 104 98 - 110 mmol/L   CO2 25 20 - 32 mmol/L   Calcium 9.9 8.6 - 10.2 mg/dL   Total Protein 7.1 6.1 - 8.1 g/dL   Albumin 4.4 3.6 - 5.1 g/dL   Globulin 2.7 1.9 - 3.7 g/dL (calc)   AG Ratio 1.6 1.0 - 2.5 (calc)   Total Bilirubin 0.5 0.2 - 1.2 mg/dL   Alkaline phosphatase (APISO) 58 31 - 125 U/L   AST 18 10 - 35 U/L   ALT 20 6 - 29 U/L  Vitamin D (25 hydroxy)     Status: Abnormal   Collection Time: 11/29/19  3:41 PM  Result Value Ref Range   Vit D, 25-Hydroxy 28 (L) 30 - 100 ng/mL  Iron, TIBC and Ferritin Panel     Status: None   Collection Time: 11/29/19  3:41 PM  Result Value Ref Range   Iron 68 40 - 190 mcg/dL   TIBC 427 250 - 450 mcg/dL (calc)   %SAT 16 16 - 45 % (calc)   Ferritin 20 16 - 232 ng/mL    Assessment/Plan: Rebecca Morrison is a 49 y.o. female present for OV for  RUQ pain Patient's area of discomfort would be atypical for gallbladder etiology.  Her symptoms seem to be mostly located in the lateral aspect of her right upper quadrant just at or below the rib cage.  Will obtain CMP to monitor her liver enzymes  and obtain right upper quadrant abdominal ultrasound to further evaluate area of discomfort.  Do not feel a complete abdominal ultrasound is necessary given the limited/focal location of her discomfort. - Comp Met (CMET) - US Abdomen Limited RUQ; Future  Hair loss Discussed possible causes such as stress, stress reaction to recent surgery earlier this year, vitamin D deficiency, iron deficiency as possible causes.  We will check those levels today and guide her on supplementation. - Vitamin D (25 hydroxy) - Iron, TIBC and Ferritin Panel   Reviewed expectations re: course of current medical issues.  Discussed self-management of symptoms.  Outlined signs and symptoms indicating need for more acute intervention.  Patient verbalized understanding and all questions were answered.  Patient received an After-Visit Summary.    Orders Placed This Encounter  Procedures   US Abdomen Limited RUQ   Comp Met (CMET)   Vitamin D (25 hydroxy)   Iron, TIBC and Ferritin Panel   Meds ordered this encounter  Medications   Iron Polysacch Cmplx-B12-FA (POLY-IRON 150 FORTE) 150-0.025-1 MG CAPS    Sig: 1 capsule PO every other day.    Dispense:  15 capsule    Refill:  2   Referral Orders  No referral(s) requested today     Note is dictated utilizing voice recognition software. Although note has been proof read prior to signing, occasional typographical errors still can be missed. If any questions arise, please do not hesitate to call for verification.   electronically signed by:  Howard Pouch, DO  [place]

## 2019-11-30 ENCOUNTER — Encounter: Payer: Self-pay | Admitting: Family Medicine

## 2019-11-30 ENCOUNTER — Telehealth: Payer: Self-pay | Admitting: Family Medicine

## 2019-11-30 LAB — COMPREHENSIVE METABOLIC PANEL
AG Ratio: 1.6 (calc) (ref 1.0–2.5)
ALT: 20 U/L (ref 6–29)
AST: 18 U/L (ref 10–35)
Albumin: 4.4 g/dL (ref 3.6–5.1)
Alkaline phosphatase (APISO): 58 U/L (ref 31–125)
BUN: 14 mg/dL (ref 7–25)
CO2: 25 mmol/L (ref 20–32)
Calcium: 9.9 mg/dL (ref 8.6–10.2)
Chloride: 104 mmol/L (ref 98–110)
Creat: 0.78 mg/dL (ref 0.50–1.10)
Globulin: 2.7 g/dL (calc) (ref 1.9–3.7)
Glucose, Bld: 78 mg/dL (ref 65–99)
Potassium: 4.2 mmol/L (ref 3.5–5.3)
Sodium: 140 mmol/L (ref 135–146)
Total Bilirubin: 0.5 mg/dL (ref 0.2–1.2)
Total Protein: 7.1 g/dL (ref 6.1–8.1)

## 2019-11-30 LAB — VITAMIN D 25 HYDROXY (VIT D DEFICIENCY, FRACTURES): Vit D, 25-Hydroxy: 28 ng/mL — ABNORMAL LOW (ref 30–100)

## 2019-11-30 LAB — IRON,TIBC AND FERRITIN PANEL
%SAT: 16 % (calc) (ref 16–45)
Ferritin: 20 ng/mL (ref 16–232)
Iron: 68 ug/dL (ref 40–190)
TIBC: 427 mcg/dL (calc) (ref 250–450)

## 2019-11-30 MED ORDER — POLY-IRON 150 FORTE 150-25-1 MG-MCG-MG PO CAPS: ORAL_CAPSULE | ORAL | 2 refills | Status: DC

## 2019-11-30 NOTE — Telephone Encounter (Signed)
Please call patient: - Her liver function and enzymes are normal. - Her vitamin D is mildly low at 28, which is an improvement from prior when it was 20.  I would encourage her to add 1000 units daily to her current vitamin D regimen.  (Example if taking 1000 units daily of vitamin D, increase to 2000 units daily vitamin D). -Lastly, her iron levels are in low normal range at 68.  - However her percent saturation is 16, less than 16 is abnormal and her ferritin is low at 20.  I do feel she would benefit from restarting iron supplementation 3 times a week with caution on possible constipation side effects.  I have called in an iron polysaccharide/B12 combination pill that seemed to be on her formulary.  If this is a too expensive or not on formulary, she can pick up an over-the-counter iron supplement and take 3 times a week.  The hair loss possibly could be explained by the surgical procedure she had this year/stress which is common and/or her lower than average iron and vitamin D levels.  Correcting the levels and time (6-12 months usually), will would resolve both potential causes.  No new the above would cause her right sided abdominal pain. -I have placed an ultrasound order for her abdominal discomfort rule out liver/gallbladder as potential cause.  I suspect this may be more muscle skeletal in nature from her exam and HPI but we will rule out other causes by ultrasound. They will call her to get this scheduled at [facility].  This is where she had her last abdominal ultrasound in 2019.

## 2019-12-01 NOTE — Telephone Encounter (Signed)
Spoke with pt regarding labs and instructions.   

## 2019-12-01 NOTE — Telephone Encounter (Signed)
LVM for pt to CB regarding results.  

## 2019-12-02 ENCOUNTER — Other Ambulatory Visit: Payer: Self-pay | Admitting: Adult Health

## 2019-12-02 DIAGNOSIS — F411 Generalized anxiety disorder: Secondary | ICD-10-CM

## 2019-12-02 DIAGNOSIS — F428 Other obsessive-compulsive disorder: Secondary | ICD-10-CM

## 2019-12-02 DIAGNOSIS — F331 Major depressive disorder, recurrent, moderate: Secondary | ICD-10-CM

## 2019-12-05 ENCOUNTER — Other Ambulatory Visit: Payer: Self-pay

## 2019-12-05 ENCOUNTER — Ambulatory Visit (INDEPENDENT_AMBULATORY_CARE_PROVIDER_SITE_OTHER): Payer: 59 | Admitting: Mental Health

## 2019-12-05 DIAGNOSIS — F909 Attention-deficit hyperactivity disorder, unspecified type: Secondary | ICD-10-CM

## 2019-12-05 DIAGNOSIS — F331 Major depressive disorder, recurrent, moderate: Secondary | ICD-10-CM | POA: Diagnosis not present

## 2019-12-05 NOTE — Progress Notes (Signed)
[facility] Psychotherapy note  Name: Rebecca Morrison Date: 12/05/19 MRN: [id number] DOB: 07-12-1970 PCP: Howard Pouch A, DO  Time spent: 52 minutes  Treatment: individual therapy  Mental Status Exam:   Appearance:   Casual     Behavior:  Appropriate  Motor:  Normal  Speech/Language:   Clear and Coherent  Affect:  Full Range  Mood:  Anxious, pleasant  Thought process:  normal  Thought content:    WNL  Sensory/Perceptual disturbances:    WNL  Orientation:  x4  Attention:  Good  Concentration:  Good  Memory:  WNL  Fund of knowledge:   Good  Insight:    Good  Judgment:   Good  Impulse Control:  developing   Reported Symptoms: Sad and anxious feelings, impulsivity, irritability, rumination, sleep disturbance, distractible, probs w/ focus/attention  Risk Assessment: Danger to Self:  No Self-injurious Behavior: No Danger to Others: No Duty to Warn:no Physical Aggression / Violence:No  Access to Firearms a concern: No  Gang Involvement:No  Patient / guardian was educated about steps to take if suicide or homicide risk level increases between visits: yes While future psychiatric events cannot be accurately predicted, the patient does not currently require acute inpatient psychiatric care and does not currently meet [facility] involuntary commitment criteria.  Subjective:  Patient presents for session on time.  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  She wanted to be involved at the school for some time, and this will allow her to do so.  She identified anxious thoughts "what if I screwed this up", "what if I do not do a good job and it ends up stressing my sons".  Through guided discovery, she was able to identify how she tends to "over think" and these types of situations.  She went on to share how she has a tendency to be excessively verbal when she feels anxious.  She stated that she and her  husband have decided to join a church and she worries how others may judge her.  She stated she has a tendency to over think these types of situations, social events where she is around other people.  She continues to express having some marital strain, shared examples of how her husband does not listen to her.  Facilitated her identifying ways to cope in situations that have been triggering her anxiety, being aware of her cognitions that manifest and increase her feelings of anxiety.  She stated she tried to follow through with making an appointment at [facility], but that appointment is at the end of December.  We provide to other referral resources for ADHD testing.  She plans to continue to discuss at her next med management appointment.  Interventions:  CBT, supportive therapy, problem solving  Diagnoses:    ICD-10-CM   1. Major depressive disorder, recurrent episode, moderate (HCC)  F33.1   2. Attention deficit hyperactivity disorder (ADHD), unspecified ADHD type  F90.9    r/o ADHD  Plan: Patient is to use CBT, mindfulness and coping skills to help manage decrease symptoms associated with their diagnosis.    Patient to continue to work on being mindful of self talk that lives with anxiety, self-doubt.    Long-term goal:   Reduce overall level, frequency, and intensity of the feelings of depression and anxiety up to at least 80% of the time in severity for at least 3 consecutive months as reported by patient  Short-term goal:  Start working [employer] and task completion consistency Improved communication skills to improve her relationships   Assessment of progress:  progressing   [place], [facility]

## 2019-12-08 ENCOUNTER — Ambulatory Visit: Payer: 59 | Admitting: Adult Health

## 2019-12-09 ENCOUNTER — Other Ambulatory Visit: Payer: 59

## 2019-12-19 ENCOUNTER — Ambulatory Visit (INDEPENDENT_AMBULATORY_CARE_PROVIDER_SITE_OTHER): Payer: 59 | Admitting: Adult Health

## 2019-12-19 ENCOUNTER — Encounter: Payer: Self-pay | Admitting: Adult Health

## 2019-12-19 ENCOUNTER — Other Ambulatory Visit: Payer: Self-pay

## 2019-12-19 ENCOUNTER — Ambulatory Visit (INDEPENDENT_AMBULATORY_CARE_PROVIDER_SITE_OTHER): Payer: 59 | Admitting: Mental Health

## 2019-12-19 DIAGNOSIS — F331 Major depressive disorder, recurrent, moderate: Secondary | ICD-10-CM

## 2019-12-19 DIAGNOSIS — F411 Generalized anxiety disorder: Secondary | ICD-10-CM

## 2019-12-19 DIAGNOSIS — F909 Attention-deficit hyperactivity disorder, unspecified type: Secondary | ICD-10-CM

## 2019-12-19 DIAGNOSIS — F428 Other obsessive-compulsive disorder: Secondary | ICD-10-CM

## 2019-12-19 MED ORDER — AMPHETAMINE-DEXTROAMPHET ER 20 MG PO CP24: 20.0000 mg | ORAL_CAPSULE | Freq: Every day | ORAL | 0 refills | Status: DC

## 2019-12-19 NOTE — Progress Notes (Signed)
[facility] Psychotherapy note  Name: Rebecca Morrison Date: 12/19/19 MRN: [id number] DOB: Sep 05, 1970 PCP: Howard Pouch A, DO  Time spent: 55 minutes  Treatment: individual therapy  Mental Status Exam:   Appearance:   Casual     Behavior:  Appropriate  Motor:  Normal  Speech/Language:   Clear and Coherent  Affect:  Full Range  Mood:  Anxious, pleasant  Thought process:  normal  Thought content:    WNL  Sensory/Perceptual disturbances:    WNL  Orientation:  x4  Attention:  Good  Concentration:  Good  Memory:  WNL  Fund of knowledge:   Good  Insight:    Good  Judgment:   Good  Impulse Control:  developing   Reported Symptoms: Sad and anxious feelings, impulsivity, irritability, rumination, sleep disturbance, distractible, probs w/ focus/attention  Risk Assessment: Danger to Self:  No Self-injurious Behavior: No Danger to Others: No Duty to Warn:no Physical Aggression / Violence:No  Access to Firearms a concern: No  Gang Involvement:No  Patient / guardian was educated about steps to take if suicide or homicide risk level increases between visits: yes While future psychiatric events cannot be accurately predicted, the patient does not currently require acute inpatient psychiatric care and does not currently meet [employer] involuntary commitment criteria.  Subjective:  Patient presents recession on time. 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 She stated that she has abstained nice days of the week from alcohol use, going to bed early has been an effective step in limousine for consumption which typically took place in the evening. She shared how she likes to be punctual, that her husband and her differ in this area. She said that she needs the structure of having employment helps her feel  better about herself "it gives me purpose". Just went on to event feelings related to her husband being controlling, often about money. Assisted her and identifying needs as well as ways you feel she has made forward making done identified life changes.  Interventions:  CBT, supportive therapy, problem solving  Diagnoses:    ICD-10-CM   1. Major depressive disorder, recurrent episode, moderate (HCC)  F33.1    r/o ADHD  Plan: Patient is to use CBT, mindfulness and coping skills to help manage decrease symptoms associated with their diagnosis.    Patient to continue to work on being mindful of self talk that lives with anxiety, self-doubt.    Long-term goal:   Reduce overall level, frequency, and intensity of the feelings of depression and anxiety up to at least 80% of the time in severity for at least 3 consecutive months as reported by patient   Short-term goal:  Start working [employer] and task completion consistency Improved communication skills to improve her relationships   Assessment of progress:  progressing   [place], [facility]

## 2019-12-19 NOTE — Progress Notes (Signed)
Rebecca Morrison [id number] [birth year] 49 y.o.  Subjective:   Patient ID:  Rebecca Morrison is a 49 y.o. (DOB 06/07/1970) female.  Chief Complaint: No chief complaint on file.   HPI Rebecca Morrison presents to the office today for follow-up of MDD, GAD, obsessional thoughts, and ADHD.  Describes mood today as "so-so". Tearful at times. Pleasant. Mood symptoms - reports depression, anxiety, and irritability.  Feels irritated a lot of the time. Gets easily overwhelmed. Afraid to make a mistake. Second guessing herself at times. Forgetful. Worries about being late. Working as a Oceanographer. Not feeling as "OCD" about things. House is "messier". Stating "I don't like that". Still needing things a certain way. Stable interest and motivation - "I have something to look forward too". Also stating "I have a purpose". Involved in counseling. Taking medications as prescribed.  Energy levels lower. Active, does not have a regular exercise routine. Walking some days.  Enjoys some usual interests and activities. Married. Lives with husband of 24 years and 2 sons 53 and 51. No family local. Moved to [place] from [place] in 2016 - husband's job. Spending time with family. Active in church.  Appetite adequate. Weight gain - 173 pounds. Sleeping better. Averages 8 hours. Waking up throughout the night.  Focus and concentration difficulties - "it's terrible". Making lists and not completing them. Completing tasks. Managing aspects of household. 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  Denies SI or HI.  Denies AH or VH. Drinks alcohol - decreased use - every 3rd day - 2 at night.   Graduated - GED. 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 Grades terrible in high school.  Fear of bugs.  Previous medication trials: Paxil 1990 up until 2003, Stratera, Wellbutrin, SSRI.   GAD-7     Office Visit from 01/18/2018 in [facility] Office Visit from 10/30/2015 in  [facility]  Total GAD-7 Score 19 14    PHQ2-9     Office Visit from 11/29/2019 in [facility] Office Visit from 03/31/2019 in [facility] Office Visit from 01/18/2018 in [facility] Office Visit from 03/27/2017 in [facility] Office Visit from 07/07/2016 in [facility]  PHQ-2 Total Score 0 0 2 0 0  PHQ-9 Total Score -- -- 16 -- --       Review of Systems:  Review of Systems  Musculoskeletal: Negative for gait problem.  Neurological: Negative for tremors.  Psychiatric/Behavioral:       Please refer to HPI    Medications: I have reviewed the patient's current medications.  Current Outpatient Medications  Medication Sig Dispense Refill  . amphetamine-dextroamphetamine (ADDERALL XR) 20 MG 24 hr capsule Take 1 capsule (20 mg total) by mouth daily. 30 capsule 0  . Cholecalciferol 25 MCG (1000 UT) tablet Take 1 tablet (1,000 Units total) by mouth daily. 90 tablet 3  . famotidine (PEPCID) 20 MG tablet Take 1 tablet by mouth daily as needed.    Marland Kitchen FLUoxetine (PROZAC) 10 MG capsule Take 1 capsule (10 mg total) by mouth daily. 30 capsule 2  . Glucosamine-Chondroit-Vit C-Mn (GLUCOSAMINE CHONDR 500 COMPLEX PO) Take 1 tablet by mouth as needed.     . Iron Polysacch Cmplx-B12-FA (POLY-IRON 150 FORTE) 150-0.025-1 MG CAPS 1 capsule PO every other day. 15 capsule 2  . vitamin B-12 (V-R  VITAMIN B-12) 500 MCG tablet Take 1 tablet (500 mcg total) by mouth daily. 90 tablet 3  . Zinc 15 MG CAPS Take 2 capsules by mouth daily.     No current facility-administered medications for this visit.    Medication Side Effects: None  Allergies: No Known Allergies  Past Medical History:  Diagnosis Date  . Allergy   . Anemia    pt states iron deficient  . Cyst of right breast 06/19/2014   US/mamm, confirmed cyst 41mm  . Depression with anxiety 01/18/2018  . GERD (gastroesophageal  reflux disease)   . Hiatal hernia    3/1//2016 Dr. Gala Lewandowsky  . Menorrhagia   . Murmur    Patient states echocardiogram was normal  . Tinnitus    ENT evaluated    Family History  Problem Relation Age of Onset  . Arthritis Mother   . Osteoporosis Mother        "severe"  . Cancer Sister   . Kidney cancer Sister   . Colon cancer Maternal Grandmother   . Ovarian cancer Maternal Grandmother   . Bone cancer Maternal Grandfather   . Heart disease Maternal Grandfather   . Breast cancer Paternal Grandmother   . Rheum arthritis Maternal Great-grandmother     Social History   Socioeconomic History  . Marital status: Married    Spouse name: Not on file  . Number of children: 2  . Years of education: Not on file  . Highest education level: Not on file  Occupational History  . Not on file  Tobacco Use  . Smoking status: Former Research scientist (life sciences)  . Smokeless tobacco: Never Used  Vaping Use  . Vaping Use: Never used  Substance and Sexual Activity  . Alcohol use: Yes    Alcohol/week: 5.0 - 6.0 standard drinks    Types: 5 - 6 Glasses of wine per week  . Drug use: No  . Sexual activity: Yes    Birth control/protection: Surgical    Comment: partner-vasectomy  Other Topics Concern  . Not on file  Social History Narrative   Married, husband [name]. 2 children [name] in [place].   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   Former smoker, quit 2003.   Social alcohol use, no drug use.   Drinks caffeinated beverages, uses herbal remedies, takes a daily vitamin.   Wears her seatbelt, wears a bicycle helmet, exercises at least 3 times a week.   Smoke detector in the home, feels safe in her relationships.   Social Determinants of Health   Financial Resource Strain:   . Difficulty of Paying Living Expenses: Not on file  Food Insecurity:   . Worried About Charity fundraiser in the Last Year: Not on file  . Ran Out of Food in the Last Year: Not on file   Transportation Needs:   . Lack of Transportation (Medical): Not on file  . Lack of Transportation (Non-Medical): Not on file  Physical Activity:   . Days of Exercise per Week: Not on file  . Minutes of Exercise per Session: Not on file  Stress:   . Feeling of Stress : Not on file  Social Connections:   . Frequency of Communication with Friends and Family: Not on file  . Frequency of Social Gatherings with Friends and Family: Not on file  . Attends Religious Services: Not on file  . Active Member of Clubs or Organizations: Not on file  . Attends Archivist Meetings: Not  on file  . Marital Status: Not on file  Intimate Partner Violence:   . Fear of Current or Ex-Partner: Not on file  . Emotionally Abused: Not on file  . Physically Abused: Not on file  . Sexually Abused: Not on file    Past Medical History, Surgical history, Social history, and Family history were reviewed and updated as appropriate.   Please see review of systems for further details on the patient's review from today.   Objective:   Physical Exam:  There were no vitals taken for this visit.  Physical Exam Constitutional:      General: She is not in acute distress. Musculoskeletal:        General: No deformity.  Neurological:     Mental Status: She is alert and oriented to person, place, and time.     Coordination: Coordination normal.  Psychiatric:        Attention and Perception: Attention and perception normal. She does not perceive auditory or visual hallucinations.        Mood and Affect: Mood normal. Mood is not anxious or depressed. Affect is not labile, blunt, angry or inappropriate.        Speech: Speech normal.        Behavior: Behavior normal.        Thought Content: Thought content normal. Thought content is not paranoid or delusional. Thought content does not include homicidal or suicidal ideation. Thought content does not include homicidal or suicidal plan.        Cognition and  Memory: Cognition and memory normal.        Judgment: Judgment normal.     Comments: Insight intact     Lab Review:     Component Value Date/Time   NA 140 11/29/2019 1541   K 4.2 11/29/2019 1541   CL 104 11/29/2019 1541   CO2 25 11/29/2019 1541   GLUCOSE 78 11/29/2019 1541   BUN 14 11/29/2019 1541   CREATININE 0.78 11/29/2019 1541   CALCIUM 9.9 11/29/2019 1541   PROT 7.1 11/29/2019 1541   ALBUMIN 4.1 09/16/2016 1517   AST 18 11/29/2019 1541   ALT 20 11/29/2019 1541   ALKPHOS 59 09/16/2016 1517   BILITOT 0.5 11/29/2019 1541   GFRNONAA >89 09/16/2016 1517   GFRAA >89 09/16/2016 1517       Component Value Date/Time   WBC 5.4 03/31/2019 0907   RBC 4.13 03/31/2019 0907   HGB 13.0 03/31/2019 0907   HCT 38.1 03/31/2019 0907   PLT 135.0 (L) 03/31/2019 0907   MCV 92.4 03/31/2019 0907   MCH 26.3 (L) 01/18/2018 1622   MCHC 34.1 03/31/2019 0907   RDW 14.2 03/31/2019 0907   LYMPHSABS 1.2 01/10/2019 1045   MONOABS 0.3 01/10/2019 1045   EOSABS 0.3 01/10/2019 1045   BASOSABS 0.0 01/10/2019 1045    No results found for: POCLITH, LITHIUM   No results found for: PHENYTOIN, PHENOBARB, VALPROATE, CBMZ   .res Assessment: Plan:    Plan:  PDMP reviewed  1. Prozac 10mg  daily 2. Add Adderall XR 20mg  every morning  135/75/62  Has submitted ADHD testing paperwork.  Read and reviewed note with patient for accuracy.   RTC 4 weeks  Patient advised to contact office with any questions, adverse effects, or acute worsening in signs and symptoms.   Diagnoses and all orders for this visit:  Attention deficit hyperactivity disorder (ADHD), unspecified ADHD type -     amphetamine-dextroamphetamine (ADDERALL XR) 20 MG 24 hr capsule; Take  1 capsule (20 mg total) by mouth daily.  Obsessional thoughts  Major depressive disorder, recurrent episode, moderate (HCC)  Generalized anxiety disorder     Please see After Visit Summary for patient specific instructions.  Future  Appointments  Date Time Provider [place]  12/19/2019  9:00 AM [place], [facility]-CP None  12/20/2019  8:00 AM [facility] [place] [facility]  01/04/2020  9:00 AM [place], [facility] CP-CP None    No orders of the defined types were placed in this encounter.   -------------------------------

## 2019-12-20 ENCOUNTER — Ambulatory Visit
Admission: RE | Admit: 2019-12-20 | Discharge: 2019-12-20 | Disposition: A | Discharge status: Home / Self Care | Payer: 59 | Source: Ambulatory Visit | Attending: Family Medicine | Admitting: Family Medicine

## 2019-12-20 DIAGNOSIS — R1011 Right upper quadrant pain: Secondary | ICD-10-CM

## 2019-12-22 ENCOUNTER — Telehealth: Payer: Self-pay | Admitting: Gastroenterology

## 2019-12-22 ENCOUNTER — Telehealth: Payer: Self-pay | Admitting: Family Medicine

## 2019-12-22 DIAGNOSIS — R1011 Right upper quadrant pain: Secondary | ICD-10-CM

## 2019-12-22 DIAGNOSIS — K8020 Calculus of gallbladder without cholecystitis without obstruction: Secondary | ICD-10-CM

## 2019-12-22 NOTE — Telephone Encounter (Signed)
Spoke with pt regarding labs and instructions.   

## 2019-12-22 NOTE — Telephone Encounter (Signed)
Spoke with patient, she recently had a RUQ [place] on 12/20/19. Patient was wanting to discuss her results with you, she has spoken with her PCPs CMA regarding results earlier, she states that she only read the results to her but couldn't answer any other questions and was told to contact us. Advised that she would need to have an office visit since she had not been seen since 2018. Advised that she would need to be reassessed and that results could be reviewed at appt along with next steps. Patient wanted to know if you could review the [place] first and give her your recommendations on next steps instead of having to wait. Patient states that she is still having RUQ pain, she states that her PCP told her that she thinks the pain is related to her liver and not her gallbladder. Pt had labs on 11/29/19 - normal LFTs. Patient has been scheduled for a new patient appt on 12/26/19 at 2:10 PM. Please advise, thank you.

## 2019-12-22 NOTE — Telephone Encounter (Signed)
Please inform patient the following information: Her liver ultrasound resulted with a small 3 mm gallstone present. The small gallbladder polyp she had in prior studies is still present but stable with no growth. The outer layer of the liver is described as "coarsened ."  In the past, it had been described as "increased echotexture most compatible with fatty infiltrate."   I would recommend referring her back to her gastroenterologist for their opinion on the significance of changes in the echotexture of her liver and further evaluation if they feel appropriate.  I have also referred her to surgery for recommendations on gallstone and gallbladder removal.

## 2019-12-22 NOTE — Telephone Encounter (Signed)
Spoke with patient and discussed recommendations as outlined by Dr. Havery Moros. Advised patient to keep follow up appt and that her PCP referred her to a surgeon for possible gall bladder removal. Answered all of patient's questions and she had no concerns at the end of the call.

## 2019-12-22 NOTE — Telephone Encounter (Signed)
She has gallstones. I previously had referred her to Dr. Donne Hazel about getting her gallbladder removed, not sure if she ever followed up to see him. With gallstones in her GB and ongoing RUQ pain, my initial impression is for her to see the surgeon again to discuss having her gallbladder removed. If she has already seen them she can call to schedule. Her LFTs are normal and reassuring. She has had fatty liver noted on prior US. Can book her a follow up with me or APP otherwise. Thanks

## 2019-12-23 ENCOUNTER — Encounter: Payer: Self-pay | Admitting: *Deleted

## 2019-12-26 ENCOUNTER — Ambulatory Visit: Payer: 59 | Admitting: Gastroenterology

## 2019-12-26 ENCOUNTER — Telehealth: Payer: Self-pay | Admitting: Gastroenterology

## 2019-12-26 NOTE — Telephone Encounter (Signed)
Rebecca Morrison, Thanks for letting me know but you need to inform me of this patient's request prior to telling the patient to follow up with another GI practice. I need to be aware of this first so I can make that decision. I can try to work on getting this patient into see [place] if she would prefer to continue her care with [place]. Can you please contact the patient and ask if she would prefer to see [place] rather than Dr. Reesa Chew, and if she would prefer to see [place] I can see what we can do about rescheduling her. Thank you

## 2019-12-26 NOTE — Telephone Encounter (Signed)
Hi Dr. Havery Moros, pt just called to reschedule her appt with you today because she woke up feeling sick. I noticed that pt transferred [facility] care to [facility] and saw Dr. Reesa Chew on 03/17/2018. I made her aware of that. She stated that her PCP made an appt with [place] because she was not able to get in with Dr. Reesa Chew soon but she has an upcoming appt with Dr. Reesa Chew on 01/11/20. I made her aware of our policy about transferring care to which she stated that she will stay with Dr. Reesa Chew. Thank you.

## 2019-12-26 NOTE — Telephone Encounter (Signed)
Dr. Havery Moros, I apologize for not making myself clear. I did give Ms. Whitworth the option to continue her care with you, I just explained the protocol to her because I could not reschedule her appointment right away. She then proceeded to tell me that she preferred to stay with Dr. Reesa Chew because she has an upcoming appt on 01/11/20 with her. She stated to be confused as to why her PCP told her to contact us because she also referred her to Dr. Reesa Chew. She thought may be her PCP wanted to check if we could see her sooner that Dr. Reesa Chew. I apologize for all the confusion. If you still want me to contact pt, please let me know. Thank you.

## 2019-12-26 NOTE — Telephone Encounter (Signed)
Thanks for clarifying. It is up to her where she wants to receive care. If she wants to be seen by me I can try to work her in but not sure I have an opening prior to 11/17 but we can see what we do. Either way okay with me, just let me know. Unfortunately she was scheduled today but could not make it. Thanks

## 2019-12-29 ENCOUNTER — Telehealth (INDEPENDENT_AMBULATORY_CARE_PROVIDER_SITE_OTHER): Payer: 59 | Admitting: Family Medicine

## 2019-12-29 ENCOUNTER — Encounter: Payer: Self-pay | Admitting: Family Medicine

## 2019-12-29 VITALS — Wt 173.0 lb

## 2019-12-29 DIAGNOSIS — R0981 Nasal congestion: Secondary | ICD-10-CM | POA: Diagnosis not present

## 2019-12-29 DIAGNOSIS — R059 Cough, unspecified: Secondary | ICD-10-CM

## 2019-12-29 MED ORDER — BENZONATATE 100 MG PO CAPS: 100.0000 mg | ORAL_CAPSULE | Freq: Three times a day (TID) | ORAL | 0 refills | Status: DC | PRN

## 2019-12-29 NOTE — Patient Instructions (Signed)
° °  It was nice to meet you today, and I really hope you are feeling better soon. I help [facility] out with telemedicine visits on Tuesdays and Thursdays and am available for visits on those days. If you have any concerns or questions following this visit please schedule a follow up visit with your Primary Care doctor or seek care at a local urgent care clinic to avoid delays in care.    Seek in person care promptly if your symptoms worsen, new concerns arise or you are not improving with treatment. Call 911 and/or seek emergency care if you symptoms are severe or life threatening.   -stay home while sick   -I sent the medication(s) we discussed to your pharmacy: Meds ordered this encounter  Medications   benzonatate (TESSALON PERLES) 100 MG capsule    Sig: Take 1 capsule (100 mg total) by mouth 3 (three) times daily as needed.    Dispense:  20 capsule    Refill:  0    -Can try nasal saline twice daily and a short 3-day course of Afrin nasal spray.  Do not use the Afrin for longer than 3 days.  -stay hydrated, drink plenty of fluids and eat small healthy meals - avoid dairy  -can take 1000 IU Vit D3 and Vit C lozenges per instructions   -follow up with your doctor in 2-3 days unless improving and feeling better

## 2019-12-29 NOTE — Progress Notes (Signed)
Virtual Visit via Video Note  I connected with Rebecca Morrison  on 12/29/19 at 12:40 PM EDT by a video enabled telemedicine application and verified that I am speaking with the correct person using two identifiers.  Location patient: home, [place] Location provider:work or home office Persons participating in the virtual visit: patient, provider  I discussed the limitations of evaluation and management by telemedicine and the availability of in person appointments. The patient expressed understanding and agreed to proceed.   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  Past Medical History:  Diagnosis Date  . Allergy   . Anemia    pt states iron deficient  . Cyst of right breast 06/19/2014   US/mamm, confirmed cyst 93mm  . Depression with anxiety 01/18/2018  . Fatty liver   . Gallbladder polyp   . Gallstone   . GERD (gastroesophageal reflux disease)   . Hiatal hernia    3/1//2016 Dr. Gala Lewandowsky  . Internal hemorrhoids   . Menorrhagia   . Murmur    Patient states echocardiogram was normal  . Reflux esophagitis   . Tinnitus    ENT evaluated    Past Surgical History:  Procedure Laterality Date  . BREAST REDUCTION SURGERY Bilateral 02/2019  . COLONOSCOPY  07/10/2011   rectal bleeding, FHX. Normal colonoscopy.   . ESOPHAGOGASTRODUODENOSCOPY  03/27/2013   hiatal hernia/GERD     Current Outpatient Medications:  .   amphetamine-dextroamphetamine (ADDERALL XR) 20 MG 24 hr capsule, Take 1 capsule (20 mg total) by mouth daily., Disp: 30 capsule, Rfl: 0 .  Cholecalciferol 25 MCG (1000 UT) tablet, Take 1 tablet (1,000 Units total) by mouth daily., Disp: 90 tablet, Rfl: 3 .  famotidine (PEPCID) 20 MG tablet, Take 1 tablet by mouth daily as needed., Disp: , Rfl:  .  FLUoxetine (PROZAC) 10 MG capsule, Take 1 capsule (10 mg total) by mouth daily., Disp: 30 capsule, Rfl: 2 .  Glucosamine-Chondroit-Vit C-Mn (GLUCOSAMINE CHONDR 500 COMPLEX PO), Take 1 tablet by mouth as needed. , Disp: , Rfl:  .  Iron Polysacch Cmplx-B12-FA (POLY-IRON 150 FORTE) 150-0.025-1 MG CAPS, 1 capsule PO every other day., Disp: 15 capsule, Rfl: 2 .  vitamin B-12 (V-R VITAMIN B-12) 500 MCG tablet, Take 1 tablet (500 mcg total) by mouth daily., Disp: 90 tablet, Rfl: 3 .  Zinc 15 MG CAPS, Take 2 capsules by mouth daily., Disp: , Rfl:  .  benzonatate (TESSALON PERLES) 100 MG capsule, Take 1 capsule (100 mg total) by mouth 3 (three) times daily as needed., Disp: 20 capsule, Rfl: 0  EXAM:  VITALS per patient if applicable:  GENERAL: alert, oriented, appears well and in no acute distress  HEENT: atraumatic, conjunttiva clear, no obvious abnormalities on inspection of external nose and ears  NECK: normal movements of the head and neck  LUNGS: on inspection no signs of respiratory distress, breathing rate appears normal, no obvious gross SOB, gasping or wheezing  CV: no obvious cyanosis  MS: moves all visible extremities without noticeable abnormality  PSYCH/NEURO: pleasant and  cooperative, no obvious depression or anxiety, speech and thought processing grossly intact  ASSESSMENT AND PLAN:  Discussed the following assessment and plan:  Nasal congestion  Cough  -we discussed possible serious and likely etiologies, options for evaluation and workup, limitations of telemedicine visit vs in person visit, treatment, treatment risks and  precautions. Pt prefers to treat via telemedicine empirically rather than in person at this moment.  Opted for symptomatic care with nasal saline, a short course of nasal decongestant and sent a Tessalon prescription for cough.  Discussed potential medication allergies with her Prozac and over-the-counter medications.  Advised to avoid Tylenol and  Tylenol-containing products if liver disease.  Advised to seek prompt in person care if worsening, new symptoms arise, or if is not improving with treatment. Discussed options for inperson care if PCP office not available. Did let this patient know that I only do telemedicine on Tuesdays and Thursdays for [place]. Advised to schedule follow up visit with PCP or [facility] if any further questions or concerns to avoid delays in care.   I discussed the assessment and treatment plan with the patient. The patient was provided an opportunity to ask questions and all were answered. The patient agreed with the plan and demonstrated an understanding of the instructions.     Rebecca Kern, DO

## 2020-01-04 ENCOUNTER — Other Ambulatory Visit: Payer: Self-pay | Admitting: Adult Health

## 2020-01-04 ENCOUNTER — Other Ambulatory Visit: Payer: Self-pay

## 2020-01-04 ENCOUNTER — Ambulatory Visit (INDEPENDENT_AMBULATORY_CARE_PROVIDER_SITE_OTHER): Payer: 59 | Admitting: Mental Health

## 2020-01-04 DIAGNOSIS — F428 Other obsessive-compulsive disorder: Secondary | ICD-10-CM | POA: Diagnosis not present

## 2020-01-04 DIAGNOSIS — F909 Attention-deficit hyperactivity disorder, unspecified type: Secondary | ICD-10-CM | POA: Diagnosis not present

## 2020-01-04 DIAGNOSIS — G4700 Insomnia, unspecified: Secondary | ICD-10-CM

## 2020-01-04 MED ORDER — AMPHETAMINE-DEXTROAMPHET ER 10 MG PO CP24: 10.0000 mg | ORAL_CAPSULE | Freq: Every day | ORAL | 0 refills | Status: DC

## 2020-01-04 MED ORDER — TRAZODONE HCL 50 MG PO TABS: 50.0000 mg | ORAL_TABLET | Freq: Every day | ORAL | 2 refills | Status: DC

## 2020-01-04 NOTE — Progress Notes (Signed)
[facility] note  Name: Rebecca Morrison Date: 01/04/20 MRN: [id number] DOB: Oct 22, 1970 PCP: Howard Pouch A, DO  Time spent: 55 minutes  Treatment: individual therapy  Mental Status Exam:   Appearance:   Casual     Behavior:  Appropriate  Motor:  Normal  Speech/Language:   Clear and Coherent  Affect:  Full Range  Mood:  Anxious, pleasant  Thought process:  normal  Thought content:    WNL  Sensory/Perceptual disturbances:    WNL  Orientation:  x4  Attention:  Good  Concentration:  Good  Memory:  WNL  Fund of knowledge:   Good  Insight:    Good  Judgment:   Good  Impulse Control:  developing   Reported Symptoms: Sad and anxious feelings, impulsivity, irritability, rumination, sleep disturbance, distractible, probs w/ focus/attention  Risk Assessment: Danger to Self:  No Self-injurious Behavior: No Danger to Others: No Duty to Warn:no Physical Aggression / Violence:No  Access to Firearms a concern: No  Gang Involvement:No  Patient / guardian was educated about steps to take if suicide or homicide risk level increases between visits: yes While future psychiatric events cannot be accurately predicted, the patient does not currently require acute inpatient psychiatric care and does not currently meet [facility] involuntary commitment criteria.  Subjective:  Patient presents recession on time.  She processed recent events, stressors.  She stated her son did not make a basketball team and feels that how the coach made the decision was unfair.  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  She identified during session ways to effectively handle the situation as she considered confirming the coach about his decision.  She plans to be supportive of her son and not intervene as she identified this would be primarily centered around her feelings of  frustration and not necessarily helpful.  She went on to share how she has been taking her medications as prescribed, feels they are effective in helping her focus and sustain concentration however reports some feelings of tension and some insomnia at times.  She stated she may have to use alcohol at times in the evening to try and get some sleep and we cautioned her regarding her alcohol use while taking her medications and ask any questions at her next med management appointment or sooner if needed.  We reviewed the resource the alcohol experiment where she stated she plans to reengage utilizing the stool to abstain from alcohol use.  We encouraged her to identify her plan to abstain from use as she expresses motivation to stop drinking.    Interventions:  CBT, supportive therapy, problem solving  Diagnoses:    ICD-10-CM   1. Obsessional thoughts  F42.8   2. Attention deficit hyperactivity disorder (ADHD), unspecified ADHD type  F90.9    r/o ADHD  Plan: Patient is to use CBT, mindfulness and coping skills to help manage decrease symptoms associated with their diagnosis.    Patient to continue to work on being mindful of self talk that lives with anxiety, self-doubt.    Long-term goal:   Reduce overall level, frequency, and intensity of the feelings of depression and anxiety up to at least 80% of the time in severity for at least 3 consecutive months as reported by patient   Short-term goal:  Start working [employer] and task completion consistency Improved communication skills to improve her relationships  Assessment of progress:  progressing   Anson [place], [facility]

## 2020-01-06 ENCOUNTER — Other Ambulatory Visit: Payer: Self-pay | Admitting: Adult Health

## 2020-01-06 DIAGNOSIS — F411 Generalized anxiety disorder: Secondary | ICD-10-CM

## 2020-01-06 DIAGNOSIS — F428 Other obsessive-compulsive disorder: Secondary | ICD-10-CM

## 2020-01-06 DIAGNOSIS — F331 Major depressive disorder, recurrent, moderate: Secondary | ICD-10-CM

## 2020-01-12 ENCOUNTER — Encounter: Payer: Self-pay | Admitting: Family Medicine

## 2020-01-13 ENCOUNTER — Encounter: Payer: 59 | Admitting: Internal Medicine

## 2020-01-13 ENCOUNTER — Telehealth: Payer: Self-pay

## 2020-01-13 NOTE — Progress Notes (Signed)
Patient didn't come for visit   . Made other arrangements

## 2020-01-13 NOTE — Telephone Encounter (Signed)
Pt sched for UTI at [facility]

## 2020-01-13 NOTE — Telephone Encounter (Signed)
Called lvm for patient to call office to clarify reason for office visit with Dr. Regis Bill today.  If UTI should be seen in [facility] prior to visit for urine sample

## 2020-01-14 LAB — URINE CULTURE
MICRO NUMBER:: 11226996
Result:: NO GROWTH
SPECIMEN QUALITY:: ADEQUATE

## 2020-01-14 LAB — URINALYSIS, ROUTINE W REFLEX MICROSCOPIC
Bacteria, UA: NONE SEEN /HPF
Bilirubin Urine: NEGATIVE
Glucose, UA: NEGATIVE
Hgb urine dipstick: NEGATIVE
Hyaline Cast: NONE SEEN /LPF
Nitrite: NEGATIVE
Protein, ur: NEGATIVE
RBC / HPF: NONE SEEN /HPF (ref 0–2)
Specific Gravity, Urine: 1.014 (ref 1.001–1.03)
pH: 6 (ref 5.0–8.0)

## 2020-01-16 ENCOUNTER — Telehealth: Payer: Self-pay | Admitting: Family Medicine

## 2020-01-16 ENCOUNTER — Telehealth: Payer: Self-pay

## 2020-01-16 NOTE — Telephone Encounter (Signed)
Received lab result from Dr. Regis Bill concerning this patient's urinary results.  Per Dr. Velora Mediate note on labs, she did not see this patient for that lab, however patient was scheduled with her and I see an open note for the visit that is not completed.   Did patient not follow through with appointment? I see the patient has been trying to contact the office concerning these results as well.  I did not see the patient for this visit, therefore I am uncertain with the circumstances are surrounding the appointment and lab results.  Can we check into this to make sure the patient is getting adequate treatment?  Thanks

## 2020-01-16 NOTE — Telephone Encounter (Signed)
Spoke with patient regarding care.  Patient states she has been trying to obtain treatment for UTI symptoms (urinary frequency/bladder discomfort) since Wednesday 01/11/20. She states she has had several phone calls back and forth regarding care and appointments.  She was told her VV appointment with Dr Regis Bill on 01/13/20 was cancelled d/t requirement of UA sample needed prior to appointment. However, patient did provide UA sample at the [facility] office for appointment.  Patient upset with how situation was handled and states she feels her mychart messages were ignored.   Patient currently continues to have symptoms, states she is taking a friends Cipro, drinking water and taking cranberry supplements. Patient is out of town until this evening around 7pm and works all day tomorrow. She is scheduled to see Dr. Nani Ravens on Wednesday, 01/18/2020. Advised if symptoms resolve, to cancel appointment and ask to speak to this nurse when/if calling.

## 2020-01-16 NOTE — Telephone Encounter (Signed)
Please follow up with patient. See message below and mychart message sent from 11/21  [facility] Patient Name: Rebecca Morrison Gender: Female DOB: March 11, 1970 Age: 49 Y 3 M 2 D Return Phone Number: [phone] (Primary) Address: City/State/Zip: [place]  [address] Client [facility] - Client Client Site [facility] - Day Physician Raoul Pitch, [place] Contact Type Call Who Is Calling Patient / Member / Family / Caregiver Call Type Triage / Clinical Relationship To Patient Self Return Phone Number [phone] (Primary) Chief Complaint Urination Pain Reason for Call Symptomatic / Request for [place] states she had a urinalysis on Friday and stated she received test results. But no one has told her what they mean. She is in pain and wants to know what is going to be done Translation No Disp. Time Eilene Ghazi Time) Disposition Final User 01/15/2020 2:36:59 PM Attempt made - no message left Sarajane Marek 01/15/2020 2:56:45 PM FINAL ATTEMPT MADE - message left Yes Hassell Done, RN, Mateo Flow

## 2020-01-16 NOTE — Telephone Encounter (Signed)
See other phone encounter.  

## 2020-01-16 NOTE — Telephone Encounter (Signed)
Pt was transferred to management to continue conversation .

## 2020-01-16 NOTE — Progress Notes (Signed)
Urine no growth  ( no uti confirmed )   No visit with me  from this lab so sending results to PCP for  any follow up plans

## 2020-01-16 NOTE — Telephone Encounter (Signed)
-----   Message from Burnis Medin, MD sent at 01/16/2020  9:44 AM EST ----- Urine no growth  ( no uti confirmed )   No visit with me  from this lab so sending results to PCP for  any follow up plans

## 2020-01-16 NOTE — Telephone Encounter (Signed)
Spoke with pt for 9 mins trying to explain results. Pt is currently out of state per mychart message and is unable to go to UC. Pt states that she is extremely frustrated with the process of the after hour line. Pt states she has been trying to have her "UTI" tx for almost a week and have not been able to be treated. Pt was informed that she was given an appointment on Friday at 3 that she verbalized to me she can do as a VV with another provider but NS. Pt stated she was informed via VM that she need to do a UA in order to do the VV per Dr. Regis Bill office. I was no longer able to speak to pt due to continuing rambling nature and disorganized timeline of events.

## 2020-01-17 ENCOUNTER — Ambulatory Visit: Payer: 59 | Admitting: Adult Health

## 2020-01-17 ENCOUNTER — Ambulatory Visit: Payer: 59 | Admitting: Mental Health

## 2020-01-18 ENCOUNTER — Ambulatory Visit: Payer: 59 | Admitting: Family Medicine

## 2020-01-25 ENCOUNTER — Telehealth: Payer: Self-pay | Admitting: Adult Health

## 2020-01-25 NOTE — Telephone Encounter (Signed)
Pt requesting Rx for Adderall XR  20 mg working well.  [facility] on file. Last apt 12/19/19.

## 2020-01-26 ENCOUNTER — Other Ambulatory Visit: Payer: Self-pay

## 2020-01-26 DIAGNOSIS — F909 Attention-deficit hyperactivity disorder, unspecified type: Secondary | ICD-10-CM

## 2020-01-26 MED ORDER — AMPHETAMINE-DEXTROAMPHET ER 20 MG PO CP24: 20.0000 mg | ORAL_CAPSULE | Freq: Every day | ORAL | 0 refills | Status: DC

## 2020-01-26 NOTE — Telephone Encounter (Signed)
FYI   Pt apt 12/10

## 2020-01-26 NOTE — Telephone Encounter (Signed)
Noted  

## 2020-01-26 NOTE — Telephone Encounter (Signed)
Script sent  

## 2020-01-26 NOTE — Telephone Encounter (Signed)
Last refill 12/19/2019, pended for Barnett Applebaum to send  Please get patient scheduled for follow up

## 2020-01-31 ENCOUNTER — Other Ambulatory Visit: Payer: Self-pay | Admitting: Adult Health

## 2020-01-31 DIAGNOSIS — G4700 Insomnia, unspecified: Secondary | ICD-10-CM

## 2020-02-02 ENCOUNTER — Ambulatory Visit (INDEPENDENT_AMBULATORY_CARE_PROVIDER_SITE_OTHER): Payer: 59 | Admitting: Mental Health

## 2020-02-02 DIAGNOSIS — F909 Attention-deficit hyperactivity disorder, unspecified type: Secondary | ICD-10-CM | POA: Diagnosis not present

## 2020-02-02 DIAGNOSIS — F428 Other obsessive-compulsive disorder: Secondary | ICD-10-CM

## 2020-02-02 NOTE — Progress Notes (Signed)
[facility] note  Name: Courtland Coppa Date: 01/04/20 MRN: [id number] DOB: 1970/09/02 PCP: Howard Pouch A, DO  Time spent: 55 minutes  Treatment: individual therapy  Virtual Visit via Telephone Note Connected with patient by a video enabled telemedicine/telehealth application, with their informed consent, and verified patient privacy and that I am speaking with the correct person using two identifiers. I discussed the limitations, risks, security and privacy concerns of performing psychotherapy and management service by telephone and the availability of in person appointments. I also discussed with the patient that there may be a patient responsible charge related to this service. The patient expressed understanding and agreed to proceed. I discussed the treatment planning with the patient. The patient was provided an opportunity to ask questions and all were answered. The patient agreed with the plan and demonstrated an understanding of the instructions. The patient was advised to call  our office if  symptoms worsen or feel they are in a crisis state and need immediate contact.   Therapist Location: office Patient Location: home    Mental Status Exam:   Appearance:   Casual     Behavior:  Appropriate  Motor:  Normal  Speech/Language:   Clear and Coherent  Affect:  Full Range  Mood:  Anxious, pleasant  Thought process:  normal  Thought content:    WNL  Sensory/Perceptual disturbances:    WNL  Orientation:  x4  Attention:  Good  Concentration:  Good  Memory:  WNL  Fund of knowledge:   Good  Insight:    Good  Judgment:   Good  Impulse Control:  developing   Reported Symptoms: Sad and anxious feelings, impulsivity, irritability, rumination, sleep disturbance, distractible, probs w/ focus/attention  Risk Assessment: Danger to Self:  No Self-injurious Behavior: No Danger to Others: No Duty to Warn:no Physical Aggression / Violence:No  Access to  Firearms a concern: No  Gang Involvement:No  Patient / guardian was educated about steps to take if suicide or homicide risk level increases between visits: yes While future psychiatric events cannot be accurately predicted, the patient does not currently require acute inpatient psychiatric care and does not currently meet [facility] involuntary commitment criteria.  Subjective:  Patient engaged in today's session via video.  Assessed progress, recent events.  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  She went on to share her experiences with family over the holiday where she had the flu and was unable to attend.  She went on to share more family dynamics, some relational strain with her father-in-law and mother-in-law.  She stated that her husband has been more aware of some of the traits with which he may exhibit at times that relate to how he was raised.  She shared how she feels her marital relationship is improving, sharing specifically him being able to provide some affirming words to her regarding her being offering the school position.  She continues to abstain from alcohol use, if she does have a drink is after several weeks in a social setting.  She stated she denies having any urges or cravings to use at all.  She went on to share how she feels this positive change has allowed her to feel better, get better rest etc.  She identified that at times, she continues to doubt herself, however, she was able to identify some positive cognitions toward  her recent accomplishments.   Interventions:  CBT, supportive therapy, problem solving  Diagnoses:    ICD-10-CM   1. Attention deficit hyperactivity disorder (ADHD), unspecified ADHD type  F90.9   2. Obsessional thoughts  F42.8    r/o ADHD  Plan: Patient is to use CBT, mindfulness and coping skills to help manage  decrease symptoms associated with their diagnosis.    Patient to continue to work on being mindful of self talk that lives with anxiety, self-doubt.    Long-term goal:   Reduce overall level, frequency, and intensity of the feelings of depression and anxiety up to at least 80% of the time in severity for at least 3 consecutive months as reported by patient   Short-term goal:  Start working [employer] and task completion consistency Improved communication skills to improve her relationships   Assessment of progress:  progressing   Anson [place], [facility]

## 2020-02-03 ENCOUNTER — Telehealth: Payer: 59 | Admitting: Adult Health

## 2020-02-25 HISTORY — PX: AUGMENTATION MAMMAPLASTY: SUR837

## 2020-03-06 ENCOUNTER — Telehealth: Payer: Self-pay | Admitting: Adult Health

## 2020-03-06 ENCOUNTER — Other Ambulatory Visit: Payer: Self-pay | Admitting: Adult Health

## 2020-03-06 DIAGNOSIS — F909 Attention-deficit hyperactivity disorder, unspecified type: Secondary | ICD-10-CM

## 2020-03-06 MED ORDER — AMPHETAMINE-DEXTROAMPHET ER 20 MG PO CP24
20.0000 mg | ORAL_CAPSULE | Freq: Every day | ORAL | 0 refills | Status: DC
Start: 2020-03-06 — End: 2020-04-25

## 2020-03-06 NOTE — Telephone Encounter (Signed)
Pt calleda nd has an appt on Monday 1/17 but needs a refill of her adderall xr 20 mg [facility] [place]

## 2020-03-06 NOTE — Telephone Encounter (Signed)
Script sent  

## 2020-03-12 ENCOUNTER — Telehealth: Payer: 59 | Admitting: Adult Health

## 2020-03-25 DIAGNOSIS — K3580 Unspecified acute appendicitis: Secondary | ICD-10-CM | POA: Insufficient documentation

## 2020-03-25 HISTORY — DX: Unspecified acute appendicitis: K35.80

## 2020-04-25 ENCOUNTER — Telehealth (INDEPENDENT_AMBULATORY_CARE_PROVIDER_SITE_OTHER): Payer: 59 | Admitting: Adult Health

## 2020-04-25 ENCOUNTER — Encounter: Payer: Self-pay | Admitting: Adult Health

## 2020-04-25 DIAGNOSIS — F909 Attention-deficit hyperactivity disorder, unspecified type: Secondary | ICD-10-CM

## 2020-04-25 DIAGNOSIS — F331 Major depressive disorder, recurrent, moderate: Secondary | ICD-10-CM

## 2020-04-25 DIAGNOSIS — G4700 Insomnia, unspecified: Secondary | ICD-10-CM | POA: Diagnosis not present

## 2020-04-25 DIAGNOSIS — F428 Other obsessive-compulsive disorder: Secondary | ICD-10-CM | POA: Diagnosis not present

## 2020-04-25 DIAGNOSIS — F411 Generalized anxiety disorder: Secondary | ICD-10-CM

## 2020-04-25 MED ORDER — AMPHETAMINE-DEXTROAMPHET ER 20 MG PO CP24: 20.0000 mg | ORAL_CAPSULE | Freq: Every day | ORAL | 0 refills | Status: DC

## 2020-04-25 NOTE — Progress Notes (Signed)
Rebecca Morrison [id number] Mar 12, 1970 50 y.o.  Virtual Visit via Video Note  I connected with pt @ on 04/25/20 at  5:20 PM EST by a video enabled telemedicine application and verified that I am speaking with the correct person using two identifiers.   I discussed the limitations of evaluation and management by telemedicine and the availability of in person appointments. The patient expressed understanding and agreed to proceed.  I discussed the assessment and treatment plan with the patient. The patient was provided an opportunity to ask questions and all were answered. The patient agreed with the plan and demonstrated an understanding of the instructions.   The patient was advised to call back or seek an in-person evaluation if the symptoms worsen or if the condition fails to improve as anticipated.  I provided 30 minutes of non-face-to-face time during this encounter.  The patient was located at home.  The provider was located at [place].   Rebecca Gell, NP   Subjective:   Patient ID:  Rebecca Morrison is a 50 y.o. (DOB Jul 16, 1970) female.  Chief Complaint: No chief complaint on file.   HPI Rebecca Morrison presents for follow-up of MDD, GAD, obsessional thoughts, and ADHD.  Describes mood today as "so-so". Tearful at times. Pleasant. Mood symptoms - denies depression, anxiety, and irritability.  Reports decreased obsessive tendencies. Stating "I feel good". Feels like head is "clear". Recent emergency appendectomy. Working as an Psychologist, prison and probation services. Stable interest and motivation. Taking medications as prescribed.  Energy levels stable. Active, does not have a regular exercise routine. Walking. Going up and down stairs.   Enjoys some usual interests and activities. Married. Lives with husband of 9 years and 2 sons 83 and 59. No family local.  Spending time with family. Active in church.  Appetite adequate. Weight gain - 173 pounds. Sleeps well most nights. Averages 8 hours.   Focus and concentration stable with Adderall. Completing tasks. Managing aspects of household. 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  Denies SI or HI.  Denies AH or VH. Decreased alcohol use.   Review of Systems:  Review of Systems  Musculoskeletal: Negative for gait problem.  Neurological: Negative for tremors.  Psychiatric/Behavioral:       Please refer to HPI    Medications: I have reviewed the patient's current medications.  Current Outpatient Medications  Medication Sig Dispense Refill  . amphetamine-dextroamphetamine (ADDERALL XR) 10 MG 24 hr capsule Take 1 capsule (10 mg total) by mouth daily. 30 capsule 0  . amphetamine-dextroamphetamine (ADDERALL XR) 20 MG 24 hr capsule Take 1 capsule (20 mg total) by mouth daily. 30 capsule 0  . benzonatate (TESSALON PERLES) 100 MG capsule Take 1 capsule (100 mg total) by mouth 3 (three) times daily as needed. 20 capsule 0  . Cholecalciferol 25 MCG (1000 UT) tablet Take 1 tablet (1,000 Units total) by mouth daily. 90 tablet 3  . famotidine (PEPCID) 20 MG tablet Take 1 tablet by mouth daily as needed.    Marland Kitchen FLUoxetine (PROZAC) 10 MG capsule TAKE 1 CAPSULE BY MOUTH EVERY DAY 90 capsule 1  . Glucosamine-Chondroit-Vit C-Mn (GLUCOSAMINE CHONDR 500 COMPLEX PO) Take 1 tablet by mouth as needed.     . Iron Polysacch Cmplx-B12-FA (POLY-IRON 150 FORTE) 150-0.025-1 MG CAPS 1 capsule PO every other day. 15 capsule 2  . traZODone (DESYREL) 50 MG tablet TAKE 1 TABLET BY MOUTH EVERYDAY AT BEDTIME 90 tablet 1  . vitamin B-12 (V-R  VITAMIN B-12) 500 MCG tablet Take 1 tablet (500 mcg total) by mouth daily. 90 tablet 3  . Zinc 15 MG CAPS Take 2 capsules by mouth daily.     No current facility-administered medications for this visit.    Medication Side Effects: None  Allergies: No Known Allergies  Past Medical History:  Diagnosis Date  . Allergy   . Anemia    pt states iron deficient  . Cyst of right  breast 06/19/2014   US/mamm, confirmed cyst 74mm  . Depression with anxiety 01/18/2018  . Fatty liver   . Gallbladder polyp   . Gallstone   . GERD (gastroesophageal reflux disease)   . Hiatal hernia    3/1//2016 Dr. Gala Lewandowsky  . Internal hemorrhoids   . Menorrhagia   . Murmur    Patient states echocardiogram was normal  . Reflux esophagitis   . Tinnitus    ENT evaluated    Family History  Problem Relation Age of Onset  . Arthritis Mother   . Osteoporosis Mother        "severe"  . Cancer Sister   . Kidney cancer Sister   . Colon cancer Maternal Grandmother   . Ovarian cancer Maternal Grandmother   . Bone cancer Maternal Grandfather   . Heart disease Maternal Grandfather   . Breast cancer Paternal Grandmother   . Rheum arthritis Maternal Great-grandmother     Social History   Socioeconomic History  . Marital status: Married    Spouse name: Not on file  . Number of children: 2  . Years of education: Not on file  . Highest education level: Not on file  Occupational History  . Not on file  Tobacco Use  . Smoking status: Former Research scientist (life sciences)  . Smokeless tobacco: Never Used  Vaping Use  . Vaping Use: Never used  Substance and Sexual Activity  . Alcohol use: Yes    Alcohol/week: 5.0 - 6.0 standard drinks    Types: 5 - 6 Glasses of wine per week  . Drug use: No  . Sexual activity: Yes    Birth control/protection: Surgical    Comment: partner-vasectomy  Other Topics Concern  . Not on file  Social History Narrative   Married, husband [name]. 2 children [name] in [place].   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   Former smoker, quit 2003.   Social alcohol use, no drug use.   Drinks caffeinated beverages, uses herbal remedies, takes a daily vitamin.   Wears her seatbelt, wears a bicycle helmet, exercises at least 3 times a week.   Smoke detector in the home, feels safe in her relationships.   Social Determinants of Health    Financial Resource Strain: Not on file  Food Insecurity: Not on file  Transportation Needs: Not on file  Physical Activity: Not on file  Stress: Not on file  Social Connections: Not on file  Intimate Partner Violence: Not on file    Past Medical History, Surgical history, Social history, and Family history were reviewed and updated as appropriate.   Please see review of systems for further details on the patient's review from today.   Objective:   Physical Exam:  There were no vitals taken for this visit.  Physical Exam Constitutional:      General: She is not in acute distress. Musculoskeletal:        General: No deformity.  Neurological:     Mental Status: She is alert and oriented to person, place,  and time.     Coordination: Coordination normal.  Psychiatric:        Attention and Perception: Attention and perception normal. She does not perceive auditory or visual hallucinations.        Mood and Affect: Mood normal. Mood is not anxious or depressed. Affect is not labile, blunt, angry or inappropriate.        Speech: Speech normal.        Behavior: Behavior normal.        Thought Content: Thought content normal. Thought content is not paranoid or delusional. Thought content does not include homicidal or suicidal ideation. Thought content does not include homicidal or suicidal plan.        Cognition and Memory: Cognition and memory normal.        Judgment: Judgment normal.     Comments: Insight intact     Lab Review:     Component Value Date/Time   NA 140 11/29/2019 1541   K 4.2 11/29/2019 1541   CL 104 11/29/2019 1541   CO2 25 11/29/2019 1541   GLUCOSE 78 11/29/2019 1541   BUN 14 11/29/2019 1541   CREATININE 0.78 11/29/2019 1541   CALCIUM 9.9 11/29/2019 1541   PROT 7.1 11/29/2019 1541   ALBUMIN 4.1 09/16/2016 1517   AST 18 11/29/2019 1541   ALT 20 11/29/2019 1541   ALKPHOS 59 09/16/2016 1517   BILITOT 0.5 11/29/2019 1541   GFRNONAA >89 09/16/2016 1517    GFRAA >89 09/16/2016 1517       Component Value Date/Time   WBC 5.4 03/31/2019 0907   RBC 4.13 03/31/2019 0907   HGB 13.0 03/31/2019 0907   HCT 38.1 03/31/2019 0907   PLT 135.0 (L) 03/31/2019 0907   MCV 92.4 03/31/2019 0907   MCH 26.3 (L) 01/18/2018 1622   MCHC 34.1 03/31/2019 0907   RDW 14.2 03/31/2019 0907   LYMPHSABS 1.2 01/10/2019 1045   MONOABS 0.3 01/10/2019 1045   EOSABS 0.3 01/10/2019 1045   BASOSABS 0.0 01/10/2019 1045    No results found for: POCLITH, LITHIUM   No results found for: PHENYTOIN, PHENOBARB, VALPROATE, CBMZ   .res Assessment: Plan:     Plan:  PDMP reviewed  1. Prozac 10mg  daily 2. Adderall XR 20mg  every morning 3. Trazadone 50mg  at hs  135/75/62  RTC 3 months  Patient advised to contact office with any questions, adverse effects, or acute worsening in signs and symptoms.   Diagnoses and all orders for this visit:  Attention deficit hyperactivity disorder (ADHD), unspecified ADHD type  Obsessional thoughts  Insomnia, unspecified type  Major depressive disorder, recurrent episode, moderate ([place])  Generalized anxiety disorder     Please see After Visit Summary for patient specific instructions.  Future Appointments  Date Time Provider [place]  04/25/2020  5:20 PM Nicloe Frontera, Berdie Ogren, NP CP-CP None    No orders of the defined types were placed in this encounter.     -------------------------------

## 2020-05-23 ENCOUNTER — Ambulatory Visit (INDEPENDENT_AMBULATORY_CARE_PROVIDER_SITE_OTHER): Payer: 59 | Admitting: Family Medicine

## 2020-05-23 ENCOUNTER — Other Ambulatory Visit: Payer: Self-pay

## 2020-05-23 ENCOUNTER — Encounter: Payer: Self-pay | Admitting: Family Medicine

## 2020-05-23 VITALS — BP 144/93 | HR 84 | Temp 98.6°F | Ht 63.0 in | Wt 176.0 lb

## 2020-05-23 DIAGNOSIS — N6320 Unspecified lump in the left breast, unspecified quadrant: Secondary | ICD-10-CM

## 2020-05-23 NOTE — Patient Instructions (Signed)
They will call you to complete mammogram.

## 2020-05-23 NOTE — Progress Notes (Signed)
This visit occurred during the SARS-CoV-2 public health emergency.  Safety protocols were in place, including screening questions prior to the visit, additional usage of staff PPE, and extensive cleaning of exam room while observing appropriate contact time as indicated for disinfecting solutions.    Rebecca Morrison , 24-Oct-1970, 50 y.o., female MRN: [id number] Patient Care Team    Relationship Specialty Notifications Start End  [name], DO PCP - General Family Medicine  09/03/15   Esaw Dace, MD Referring Physician Gastroenterology  03/31/19   Darrin Luis, MD Referring Physician Plastic Surgery  03/31/19   Huel Cote, NP (Inactive) Nurse Practitioner Obstetrics and Gynecology  03/31/19     Chief Complaint  Patient presents with  . Breast Mass    Pt c/o mass on L breast approx. 8 oclock first noticed one week ago;      Subjective: Pt presents for an OV with complaints of outpatient of a breast mass in her left breast about 1 week ago.  Patient underwent a breast reduction approximately 14 months ago.  She has since had a normal mammogram in June 2021.  Family history of breast cancer, bone cancer, ovarian cancer, colon cancer, kidney cancer.  Depression screen [facility] 2/9 11/29/2019 03/31/2019 01/18/2018 01/18/2018 03/27/2017  Decreased Interest 0 0 1 0 0  Down, Depressed, Hopeless 0 0 1 0 0  PHQ - 2 Score 0 0 2 0 0  Altered sleeping - - 3 - -  Tired, decreased energy - - 3 - -  Change in appetite - - 3 - -  Feeling bad or failure about yourself  - - 1 - -  Trouble concentrating - - 1 - -  Moving slowly or fidgety/restless - - 3 - -  Suicidal thoughts - - 0 - -  PHQ-9 Score - - 16 - -  Difficult doing work/chores - - Somewhat difficult - -    No Known Allergies Social History   Social History Narrative   Married, husband Psychologist, clinical. 2 children [name] in [place].   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   Former smoker, quit  2003.   Social alcohol use, no drug use.   Drinks caffeinated beverages, uses herbal remedies, takes a daily vitamin.   Wears her seatbelt, wears a bicycle helmet, exercises at least 3 times a week.   Smoke detector in the home, feels safe in her relationships.   Past Medical History:  Diagnosis Date  . Allergy   . Anemia    pt states iron deficient  . Cyst of right breast 06/19/2014   US/mamm, confirmed cyst 51mm  . Depression with anxiety 01/18/2018  . Fatty liver   . Gallbladder polyp   . Gallstone   . GERD (gastroesophageal reflux disease)   . Hiatal hernia    3/1//2016 Dr. Gala Lewandowsky  . Internal hemorrhoids   . Menorrhagia   . Murmur    Patient states echocardiogram was normal  . Reflux esophagitis   . Tinnitus    ENT evaluated   Past Surgical History:  Procedure Laterality Date  . BREAST REDUCTION SURGERY Bilateral 02/2019  . COLONOSCOPY  07/10/2011   rectal bleeding, FHX. Normal colonoscopy.   . ESOPHAGOGASTRODUODENOSCOPY  03/27/2013   hiatal hernia/GERD   Family History  Problem Relation Age of Onset  . Arthritis Mother   . Osteoporosis Mother        "severe"  . Cancer Sister   . Kidney cancer Sister   .  Colon cancer Maternal Grandmother   . Ovarian cancer Maternal Grandmother   . Bone cancer Maternal Grandfather   . Heart disease Maternal Grandfather   . Breast cancer Paternal Grandmother   . Rheum arthritis Maternal Great-grandmother    Allergies as of 05/23/2020   No Known Allergies     Medication List       Accurate as of May 23, 2020 11:59 PM. If you have any questions, ask your nurse or doctor.        STOP taking these medications   benzonatate 100 MG capsule Commonly known as: Best boy Stopped by: Howard Pouch, DO     TAKE these medications   amphetamine-dextroamphetamine 10 MG 24 hr capsule Commonly known as: Adderall XR Take 1 capsule (10 mg total) by mouth daily.   amphetamine-dextroamphetamine 20 MG 24 hr capsule Commonly  known as: Adderall XR Take 1 capsule (20 mg total) by mouth daily.   amphetamine-dextroamphetamine 20 MG 24 hr capsule Commonly known as: Adderall XR Take 1 capsule (20 mg total) by mouth daily.   amphetamine-dextroamphetamine 20 MG 24 hr capsule Commonly known as: Adderall XR Take 1 capsule (20 mg total) by mouth daily. Start taking on: June 20, 2020   Cholecalciferol 25 MCG (1000 UT) tablet Take 1 tablet (1,000 Units total) by mouth daily.   famotidine 20 MG tablet Commonly known as: PEPCID Take 1 tablet by mouth daily as needed.   FLUoxetine 10 MG capsule Commonly known as: PROZAC TAKE 1 CAPSULE BY MOUTH EVERY DAY   GLUCOSAMINE CHONDR 500 COMPLEX PO Take 1 tablet by mouth as needed.   Poly-Iron 150 Forte 150-0.025-1 MG Caps Generic drug: Iron Polysacch Cmplx-B12-FA 1 capsule PO every other day.   traZODone 50 MG tablet Commonly known as: DESYREL TAKE 1 TABLET BY MOUTH EVERYDAY AT BEDTIME   vitamin B-12 500 MCG tablet Commonly known as: V-R VITAMIN B-12 Take 1 tablet (500 mcg total) by mouth daily.   Zinc 15 MG Caps Take 2 capsules by mouth daily.       All past medical history, surgical history, allergies, family history, immunizations andmedications were updated in the EMR today and reviewed under the history and medication portions of their EMR.     ROS: Negative, with the exception of above mentioned in HPI   Objective:  BP (!) 144/93   Pulse 84   Temp 98.6 F (37 C) (Oral)   Ht 5\' 3"  (1.6 m)   Wt 176 lb (79.8 kg)   SpO2 97%   BMI 31.18 kg/m  Body mass index is 31.18 kg/m. Gen: Afebrile. No acute distress. Nontoxic in appearance, well developed, well nourished.  HENT: AT. [place].  Eyes:Pupils Equal Round Reactive to light, Extraocular movements intact,  Conjunctiva without redness, discharge or icterus. Breasts: right breast appear normal, no suspicious masses, no skin or nipple changes or axillary nodes. Left breast with area of increased density-  no discrete mass noted at 8 o'clock position just above scar line. Otherwise , left breasts appears normal,  no skin or nipple changes or axillary nodes. Neuro: Normal gait. PERLA. EOMi. Alert. Oriented x3  Psych: Normal affect, dress and demeanor. Normal speech. Normal thought content and judgment.  No exam data present No results found. No results found for this or any previous visit (from the past 24 hour(s)).  Assessment/Plan: Rebecca Morrison is a 50 y.o. female present for OV for  Breast mass, left There is an area of density in the location she is  concerned.  Uncertain if there is a discrete mass in this location or if what she is feeling is either scar tissue from her breast reduction or dense breast tissue.  We will move forward with mammogram and ultrasound. - US BREAST LTD UNI LEFT INC AXILLA; Future - MM DIAG BREAST TOMO UNI LEFT; Future   Reviewed expectations re: course of current medical issues.  Discussed self-management of symptoms.  Outlined signs and symptoms indicating need for more acute intervention.  Patient verbalized understanding and all questions were answered.  Patient received an After-Visit Summary.    Orders Placed This Encounter  Procedures  . US BREAST LTD UNI LEFT INC AXILLA  . MM DIAG BREAST TOMO UNI LEFT   No orders of the defined types were placed in this encounter.  Referral Orders  No referral(s) requested today     Note is dictated utilizing voice recognition software. Although note has been proof read prior to signing, occasional typographical errors still can be missed. If any questions arise, please do not hesitate to call for verification.   electronically signed by:  Howard Pouch, DO  [place]

## 2020-07-04 ENCOUNTER — Ambulatory Visit
Admission: RE | Admit: 2020-07-04 | Discharge: 2020-07-04 | Disposition: A | Discharge status: Home / Self Care | Payer: 59 | Source: Ambulatory Visit | Attending: Family Medicine | Admitting: Family Medicine

## 2020-07-04 ENCOUNTER — Other Ambulatory Visit: Payer: Self-pay

## 2020-07-04 ENCOUNTER — Other Ambulatory Visit: Payer: Self-pay | Admitting: Family Medicine

## 2020-07-04 DIAGNOSIS — N6320 Unspecified lump in the left breast, unspecified quadrant: Secondary | ICD-10-CM

## 2020-07-10 ENCOUNTER — Ambulatory Visit
Admission: RE | Admit: 2020-07-10 | Discharge: 2020-07-10 | Disposition: A | Discharge status: Home / Self Care | Payer: 59 | Source: Ambulatory Visit | Attending: Family Medicine | Admitting: Family Medicine

## 2020-07-10 ENCOUNTER — Other Ambulatory Visit: Payer: Self-pay

## 2020-07-10 DIAGNOSIS — N6320 Unspecified lump in the left breast, unspecified quadrant: Secondary | ICD-10-CM

## 2020-07-11 ENCOUNTER — Telehealth: Payer: Self-pay | Admitting: Family Medicine

## 2020-07-11 NOTE — Telephone Encounter (Signed)
They performed a diagnostic mammogram because it was ordered secondary to her finding the mass. Therefore, the image center focuses on that concerning area.   A screening mammogram images both breast.- as a screen- not a problem focus.   I am very sorry to see her biopsy  was positive for cancer. She will be taken care of by our oncology team. They will coordinate additional images, surgical consults and treatments. Please assure her they will perform complete images and PET scans etc. They will not let the other breast go unchecked.

## 2020-07-11 NOTE — Telephone Encounter (Signed)
LVM for pt to CB regarding prior questions.  

## 2020-07-11 NOTE — Telephone Encounter (Signed)
Patient states that she had a mammogram/biopsy done and it showed Cancer, but they only checked one side and she's concerned and wants to know why both sides weren't done. Please give her a call back at [phone].

## 2020-07-12 ENCOUNTER — Ambulatory Visit
Admission: RE | Admit: 2020-07-12 | Discharge: 2020-07-12 | Disposition: A | Discharge status: Home / Self Care | Payer: 59 | Source: Ambulatory Visit | Attending: Family Medicine | Admitting: Family Medicine

## 2020-07-12 ENCOUNTER — Telehealth: Payer: Self-pay | Admitting: Hematology and Oncology

## 2020-07-12 ENCOUNTER — Other Ambulatory Visit: Payer: Self-pay | Admitting: Family Medicine

## 2020-07-12 ENCOUNTER — Other Ambulatory Visit: Payer: Self-pay

## 2020-07-12 DIAGNOSIS — C50912 Malignant neoplasm of unspecified site of left female breast: Secondary | ICD-10-CM

## 2020-07-12 DIAGNOSIS — N6310 Unspecified lump in the right breast, unspecified quadrant: Secondary | ICD-10-CM

## 2020-07-12 NOTE — Telephone Encounter (Signed)
LVM for pt to CB regarding prior questions.

## 2020-07-12 NOTE — Telephone Encounter (Signed)
Spoke to the patient to confirm afternoon [facility] appointment for 5/25, paperwork sent via email

## 2020-07-13 ENCOUNTER — Encounter: Payer: Self-pay | Admitting: *Deleted

## 2020-07-13 ENCOUNTER — Encounter: Payer: Self-pay | Admitting: Family Medicine

## 2020-07-13 DIAGNOSIS — Z170 Estrogen receptor positive status [ER+]: Secondary | ICD-10-CM | POA: Insufficient documentation

## 2020-07-13 DIAGNOSIS — C50512 Malignant neoplasm of lower-outer quadrant of left female breast: Secondary | ICD-10-CM

## 2020-07-13 DIAGNOSIS — C50912 Malignant neoplasm of unspecified site of left female breast: Secondary | ICD-10-CM | POA: Insufficient documentation

## 2020-07-17 NOTE — Progress Notes (Signed)
[facility] NOTE  Patient Care Team: [name], DO as PCP - General (Family Medicine) Esaw Dace, MD as Referring Physician (Gastroenterology) Darrin Luis, MD as Referring Physician (Plastic Surgery) Huel Cote, NP (Inactive) as Nurse Practitioner (Obstetrics and Gynecology) Mauro Kaufmann, RN as Oncology Nurse Navigator Rockwell Germany, RN as Oncology Nurse Navigator Jovita Kussmaul, MD as Consulting Physician (General Surgery) [name], MD as Consulting Physician (Hematology and Oncology) [name], MD as Consulting Physician (Radiation Oncology)  CHIEF COMPLAINTS/PURPOSE OF CONSULTATION:  Newly diagnosed breast cancer  HISTORY OF PRESENTING ILLNESS:  Rebecca Morrison 50 y.o. female is here because of recent diagnosis of invasive ductal carcinoma of the left breast. Patient palpated a left breast lump at the 8 o'clock position above a scar from previous breast reduction surgery. Diagnostic mammogram and [place] on 07/04/20 showed a 0.8cm left breast mass at the 8 o'clock position and no left axillary adenopathy. Biopsy on 07/10/20 showed invasive ductal carcinoma, grade 1/2, HER-2 equivocal by IHC, negative by FISH (ratio 1.14), ER+ 95%, PR+ 90%, Ki67 5%. She presents to the clinic today for initial evaluation and discussion of treatment options.   I reviewed her records extensively and collaborated the history with the patient.  SUMMARY OF ONCOLOGIC HISTORY: Oncology History  Malignant neoplasm of lower-outer quadrant of left breast of female, estrogen receptor positive ([place])  07/10/2020 Initial Diagnosis   Patient palpated a left breast lump at the 8 o'clock position above a scar from previous breast reduction surgery. Diagnostic mammogram and US showed a 0.8cm left breast mass at the 8 o'clock position and no left axillary adenopathy. Biopsy showed IDC, grade 1/2, HER-2 equivocal by IHC, negative by FISH (ratio 1.14), ER+ 95%, PR+ 90%, Ki67 5%.    07/18/2020 Cancer Staging   Staging form: Breast, AJCC 8th Edition - Clinical stage from 07/18/2020: Stage IA (cT1b, cN0, cM0, G2, ER+, PR+, HER2-) - Signed by [name], MD on 07/18/2020 Stage prefix: Initial diagnosis Method of lymph node assessment: Clinical Histologic grading system: 3 grade system     MEDICAL HISTORY:  Past Medical History:  Diagnosis Date  . Allergy   . Anemia    pt states iron deficient  . Cyst of right breast 06/19/2014   US/mamm, confirmed cyst 73mm  . Depression with anxiety 01/18/2018  . Fatty liver   . Gallbladder polyp   . Gallstone   . GERD (gastroesophageal reflux disease)   . Hiatal hernia    3/1//2016 Dr. Gala Lewandowsky  . Internal hemorrhoids   . Menorrhagia   . Murmur    Patient states echocardiogram was normal  . Reflux esophagitis   . Tinnitus    ENT evaluated    SURGICAL HISTORY: Past Surgical History:  Procedure Laterality Date  . BREAST REDUCTION SURGERY Bilateral 02/2019  . COLONOSCOPY  07/10/2011   rectal bleeding, FHX. Normal colonoscopy.   . ESOPHAGOGASTRODUODENOSCOPY  03/27/2013   hiatal hernia/GERD    SOCIAL HISTORY: Social History   Socioeconomic History  . Marital status: Married    Spouse name: Not on file  . Number of children: 2  . Years of education: Not on file  . Highest education level: Not on file  Occupational History  . Not on file  Tobacco Use  . Smoking status: Former Research scientist (life sciences)  . Smokeless tobacco: Never Used  Vaping Use  . Vaping Use: Never used  Substance and Sexual Activity  . Alcohol use: Yes    Alcohol/week: 5.0 -  6.0 standard drinks    Types: 5 - 6 Glasses of wine per week  . Drug use: No  . Sexual activity: Yes    Birth control/protection: Surgical    Comment: partner-vasectomy  Other Topics Concern  . Not on file  Social History Narrative   Married, husband [name]. 2 children [name] in [place].   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   Former  smoker, quit 2003.   Social alcohol use, no drug use.   Drinks caffeinated beverages, uses herbal remedies, takes a daily vitamin.   Wears her seatbelt, wears a bicycle helmet, exercises at least 3 times a week.   Smoke detector in the home, feels safe in her relationships.   Social Determinants of Health   Financial Resource Strain: Not on file  Food Insecurity: Not on file  Transportation Needs: Not on file  Physical Activity: Not on file  Stress: Not on file  Social Connections: Not on file  Intimate Partner Violence: Not on file    FAMILY HISTORY: Family History  Problem Relation Age of Onset  . Arthritis Mother   . Osteoporosis Mother        "severe"  . Cancer Sister   . Kidney cancer Sister   . Colon cancer Maternal Grandmother   . Ovarian cancer Maternal Grandmother   . Bone cancer Maternal Grandfather   . Heart disease Maternal Grandfather   . Breast cancer Paternal Grandmother   . Rheum arthritis Maternal Great-grandmother     ALLERGIES:  has No Known Allergies.  MEDICATIONS:  Current Outpatient Medications  Medication Sig Dispense Refill  . amphetamine-dextroamphetamine (ADDERALL XR) 10 MG 24 hr capsule Take 1 capsule (10 mg total) by mouth daily. 30 capsule 0  . amphetamine-dextroamphetamine (ADDERALL XR) 20 MG 24 hr capsule Take 1 capsule (20 mg total) by mouth daily. 30 capsule 0  . amphetamine-dextroamphetamine (ADDERALL XR) 20 MG 24 hr capsule Take 1 capsule (20 mg total) by mouth daily. 30 capsule 0  . amphetamine-dextroamphetamine (ADDERALL XR) 20 MG 24 hr capsule Take 1 capsule (20 mg total) by mouth daily. 30 capsule 0  . Cholecalciferol 25 MCG (1000 UT) tablet Take 1 tablet (1,000 Units total) by mouth daily. (Patient not taking: Reported on 05/23/2020) 90 tablet 3  . famotidine (PEPCID) 20 MG tablet Take 1 tablet by mouth daily as needed.    Marland Kitchen FLUoxetine (PROZAC) 10 MG capsule TAKE 1 CAPSULE BY MOUTH EVERY DAY 90 capsule 1  . Glucosamine-Chondroit-Vit  C-Mn (GLUCOSAMINE CHONDR 500 COMPLEX PO) Take 1 tablet by mouth as needed.  (Patient not taking: Reported on 05/23/2020)    . Iron Polysacch Cmplx-B12-FA (POLY-IRON 150 FORTE) 150-0.025-1 MG CAPS 1 capsule PO every other day. (Patient not taking: Reported on 05/23/2020) 15 capsule 2  . traZODone (DESYREL) 50 MG tablet TAKE 1 TABLET BY MOUTH EVERYDAY AT BEDTIME (Patient not taking: Reported on 05/23/2020) 90 tablet 1  . vitamin B-12 (V-R VITAMIN B-12) 500 MCG tablet Take 1 tablet (500 mcg total) by mouth daily. (Patient not taking: Reported on 05/23/2020) 90 tablet 3   No current facility-administered medications for this visit.    REVIEW OF SYSTEMS:   Constitutional: Denies fevers, chills or abnormal night sweats   All other systems were reviewed with the patient and are negative.  PHYSICAL EXAMINATION: ECOG PERFORMANCE STATUS: 1 - Symptomatic but completely ambulatory  Vitals:   07/18/20 1246  BP: (!) 158/98  Pulse: 73  Resp: 16  Temp: 97.7  F (36.5 C)  SpO2: 100%   Filed Weights   07/18/20 1246  Weight: 172 lb 11.2 oz (78.3 kg)       LABORATORY DATA:  I have reviewed the data as listed Lab Results  Component Value Date   WBC 4.0 07/18/2020   HGB 12.2 07/18/2020   HCT 36.9 07/18/2020   MCV 90.0 07/18/2020   PLT 159 07/18/2020   Lab Results  Component Value Date   NA 139 07/18/2020   K 4.3 07/18/2020   CL 103 07/18/2020   CO2 28 07/18/2020    RADIOGRAPHIC STUDIES: I have personally reviewed the radiological reports and agreed with the findings in the report.  ASSESSMENT AND PLAN:  Malignant neoplasm of lower-outer quadrant of left breast of female, estrogen receptor positive ([place]) 07/10/2020:Patient palpated a left breast lump at the 8 o'clock position above a scar from previous breast reduction surgery. Diagnostic mammogram and US showed a 0.8cm left breast mass at the 8 o'clock position and no left axillary adenopathy. Biopsy showed IDC, grade 1/2, HER-2 equivocal  by IHC, negative by FISH (ratio 1.14), ER+ 95%, PR+ 90%, Ki67 5%.  Pathology and radiology counseling:Discussed with the patient, the details of pathology including the type of breast cancer,the clinical staging, the significance of ER, PR and HER-2/neu receptors and the implications for treatment. After reviewing the pathology in detail, we proceeded to discuss the different treatment options between surgery, radiation, chemotherapy, antiestrogen therapies.  Recommendations: 1. Breast conserving surgery followed by 2. Oncotype DX testing based on final tumor characteristics.  If the tumor is less than a centimeter and it is grade 1 we will not need Oncotype DX test. 3. Adjuvant radiation therapy followed by 4. Adjuvant antiestrogen therapy with tamoxifen 20 mg daily x 10 years  Liver MRI: This will be ordered because CT scan at her 1 showed hypodensities in the liver. Breast MRI we discussed the pros and cons of obtaining an MRI of the breast.  She understands the risk of requiring additional biopsies based on findings of the breast MRI.  I discussed with her that there is no role for whole-body scans.  Oncotype counseling: I discussed Oncotype DX test. I explained to the patient that this is a 21 gene panel to evaluate patient tumors DNA to calculate recurrence score. This would help determine whether patient has high risk or low risk breast cancer. She understands that if her tumor was found to be high risk, she would benefit from systemic chemotherapy. If low risk, no need of chemotherapy.  Return to clinic after surgery to discuss final pathology report and then determine if Oncotype DX testing will need to be sent.     All questions were answered. The patient knows to call the clinic with any problems, questions or concerns.   Rulon Eisenmenger, MD, MPH 07/18/2020    I, Molly Dorshimer, am acting as scribe for [name], MD.  I have reviewed the above documentation for accuracy and  completeness, and I agree with the above.

## 2020-07-18 ENCOUNTER — Inpatient Hospital Stay: Payer: 59 | Attending: Hematology and Oncology

## 2020-07-18 ENCOUNTER — Inpatient Hospital Stay (HOSPITAL_BASED_OUTPATIENT_CLINIC_OR_DEPARTMENT_OTHER): Payer: 59 | Admitting: Hematology and Oncology

## 2020-07-18 ENCOUNTER — Ambulatory Visit
Admission: RE | Admit: 2020-07-18 | Discharge: 2020-07-18 | Disposition: A | Discharge status: Home / Self Care | Payer: 59 | Source: Ambulatory Visit | Attending: Radiation Oncology | Admitting: Radiation Oncology

## 2020-07-18 ENCOUNTER — Ambulatory Visit (HOSPITAL_BASED_OUTPATIENT_CLINIC_OR_DEPARTMENT_OTHER): Payer: 59 | Admitting: Genetic Counselor

## 2020-07-18 ENCOUNTER — Other Ambulatory Visit: Payer: Self-pay

## 2020-07-18 ENCOUNTER — Encounter: Payer: Self-pay | Admitting: Physical Therapy

## 2020-07-18 ENCOUNTER — Ambulatory Visit: Payer: 59 | Attending: General Surgery | Admitting: Physical Therapy

## 2020-07-18 ENCOUNTER — Other Ambulatory Visit: Payer: Self-pay | Admitting: *Deleted

## 2020-07-18 ENCOUNTER — Encounter: Payer: Self-pay | Admitting: *Deleted

## 2020-07-18 DIAGNOSIS — R293 Abnormal posture: Secondary | ICD-10-CM | POA: Diagnosis present

## 2020-07-18 DIAGNOSIS — Z170 Estrogen receptor positive status [ER+]: Secondary | ICD-10-CM | POA: Insufficient documentation

## 2020-07-18 DIAGNOSIS — Z800 Family history of malignant neoplasm of digestive organs: Secondary | ICD-10-CM | POA: Diagnosis not present

## 2020-07-18 DIAGNOSIS — C50512 Malignant neoplasm of lower-outer quadrant of left female breast: Secondary | ICD-10-CM | POA: Diagnosis present

## 2020-07-18 DIAGNOSIS — C50912 Malignant neoplasm of unspecified site of left female breast: Secondary | ICD-10-CM

## 2020-07-18 DIAGNOSIS — Z8051 Family history of malignant neoplasm of kidney: Secondary | ICD-10-CM | POA: Diagnosis not present

## 2020-07-18 DIAGNOSIS — Z87891 Personal history of nicotine dependence: Secondary | ICD-10-CM | POA: Insufficient documentation

## 2020-07-18 DIAGNOSIS — Z8041 Family history of malignant neoplasm of ovary: Secondary | ICD-10-CM | POA: Diagnosis not present

## 2020-07-18 DIAGNOSIS — Z806 Family history of leukemia: Secondary | ICD-10-CM

## 2020-07-18 DIAGNOSIS — Z803 Family history of malignant neoplasm of breast: Secondary | ICD-10-CM

## 2020-07-18 LAB — CBC WITH DIFFERENTIAL (CANCER CENTER ONLY)
Abs Immature Granulocytes: 0.01 10*3/uL (ref 0.00–0.07)
Basophils Absolute: 0 10*3/uL (ref 0.0–0.1)
Basophils Relative: 1 %
Eosinophils Absolute: 0.2 10*3/uL (ref 0.0–0.5)
Eosinophils Relative: 4 %
HCT: 36.9 % (ref 36.0–46.0)
Hemoglobin: 12.2 g/dL (ref 12.0–15.0)
Immature Granulocytes: 0 %
Lymphocytes Relative: 25 %
Lymphs Abs: 1 10*3/uL (ref 0.7–4.0)
MCH: 29.8 pg (ref 26.0–34.0)
MCHC: 33.1 g/dL (ref 30.0–36.0)
MCV: 90 fL (ref 80.0–100.0)
Monocytes Absolute: 0.3 10*3/uL (ref 0.1–1.0)
Monocytes Relative: 7 %
Neutro Abs: 2.6 10*3/uL (ref 1.7–7.7)
Neutrophils Relative %: 63 %
Platelet Count: 159 10*3/uL (ref 150–400)
RBC: 4.1 MIL/uL (ref 3.87–5.11)
RDW: 13.2 % (ref 11.5–15.5)
WBC Count: 4 10*3/uL (ref 4.0–10.5)
nRBC: 0 % (ref 0.0–0.2)

## 2020-07-18 LAB — CMP (CANCER CENTER ONLY)
ALT: 25 U/L (ref 0–44)
AST: 24 U/L (ref 15–41)
Albumin: 4.2 g/dL (ref 3.5–5.0)
Alkaline Phosphatase: 65 U/L (ref 38–126)
Anion gap: 8 (ref 5–15)
BUN: 9 mg/dL (ref 6–20)
CO2: 28 mmol/L (ref 22–32)
Calcium: 9.7 mg/dL (ref 8.9–10.3)
Chloride: 103 mmol/L (ref 98–111)
Creatinine: 0.82 mg/dL (ref 0.44–1.00)
GFR, Estimated: >60 mL/min (ref >60)
Glucose, Bld: 104 mg/dL — ABNORMAL HIGH (ref 70–99)
Potassium: 4.3 mmol/L (ref 3.5–5.1)
Sodium: 139 mmol/L (ref 135–145)
Total Bilirubin: 0.6 mg/dL (ref 0.3–1.2)
Total Protein: 7.4 g/dL (ref 6.5–8.1)

## 2020-07-18 LAB — GENETIC SCREENING ORDER

## 2020-07-18 NOTE — Therapy (Signed)
[place], [place], [address] Phone: [phone]   Fax:  [phone]  Physical Therapy Evaluation  Patient Details  Name: Rebecca Morrison MRN: [id number] Date of Birth: 1970-11-05 Referring Provider (PT): Dr. Autumn Messing   Encounter Date: 07/18/2020   PT End of Session - 07/18/20 1405    Visit Number 1    Number of Visits 2    Date for PT Re-Evaluation 09/12/20    PT Start Time 1552    PT Stop Time 1626    PT Time Calculation (min) 34 min    Activity Tolerance Patient tolerated treatment well    Behavior During Therapy [facility] for tasks assessed/performed           Past Medical History:  Diagnosis Date  . Allergy   . Anemia    pt states iron deficient  . Cyst of right breast 06/19/2014   US/mamm, confirmed cyst 52m  . Depression with anxiety 01/18/2018  . Fatty liver   . Gallbladder polyp   . Gallstone   . GERD (gastroesophageal reflux disease)   . Hiatal hernia    3/1//2016 Dr. LGala Lewandowsky . Internal hemorrhoids   . Menorrhagia   . Murmur    Patient states echocardiogram was normal  . Reflux esophagitis   . Tinnitus    ENT evaluated    Past Surgical History:  Procedure Laterality Date  . BREAST REDUCTION SURGERY Bilateral 02/2019  . COLONOSCOPY  07/10/2011   rectal bleeding, FHX. Normal colonoscopy.   . ESOPHAGOGASTRODUODENOSCOPY  03/27/2013   hiatal hernia/GERD    There were no vitals filed for this visit.    Subjective Assessment - 07/18/20 1359    Subjective Patient reports she is here today to be seen by her medical team for her newly diagnosed left breast cancer.    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 It measures 8 mm and is located in the lower outer quadrant. It is ER/PR positive and HER2 negative with a Ki67 of 5%.    Patient Stated Goals Reduce lymphedema  risk and learn post op shoulder ROM HEP    Currently in Pain? No/denies              [facility]      Assessment   Medical Diagnosis Left breast cancer    Referring Provider (PT) Dr. PAutumn Messing   Onset Date/Surgical Date 07/04/20    Hand Dominance Right    Prior Therapy none      Precautions   Precautions Other (comment)    Precaution Comments active cancer      Restrictions   Weight Bearing Restrictions No      Balance Screen   Has the patient fallen in the past 6 months No    Has the patient had a decrease in activity level because of a fear of falling?  No    Is the patient reluctant to leave their home because of a fear of falling?  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    Leisure She does not exercise      Cognition   Overall Cognitive Status Within Functional Limits for tasks assessed      Posture/Postural Control   Posture/Postural Control Postural limitations    Postural Limitations Rounded Shoulders;Forward head      ROM / Strength   AROM / PROM / Strength AROM;Strength      AROM   AROM Assessment Site Shoulder    Right/Left Shoulder Right;Left    Right Shoulder Extension 58 Degrees    Right Shoulder Flexion 150 Degrees    Right Shoulder ABduction 175 Degrees    Right Shoulder Internal Rotation 68 Degrees    Right Shoulder External Rotation 83 Degrees    Left Shoulder Extension 53 Degrees    Left Shoulder Flexion 160 Degrees    Left Shoulder ABduction 180 Degrees    Left Shoulder Internal Rotation 83 Degrees    Left Shoulder External Rotation 78 Degrees      Strength   Overall Strength Within functional limits for tasks performed              LYMPHEDEMA/ONCOLOGY QUESTIONNAIRE - 07/18/20 0001      Type   Cancer Type Left breast cancer      Lymphedema Assessments   Lymphedema Assessments Upper extremities      Right Upper Extremity Lymphedema   10 cm Proximal to Olecranon Process 31.2 cm    Olecranon Process 23.9 cm    10 cm Proximal to Ulnar Styloid Process 22.5 cm    Just Proximal to Ulnar Styloid Process 14.1 cm    Across Hand at [employer] 16.6 cm    At [place] of 2nd Digit 5.5 cm      Left Upper Extremity Lymphedema   10 cm Proximal to Olecranon Process 32 cm    Olecranon Process 24 cm    10 cm Proximal to Ulnar Styloid Process 21.5 cm    Just Proximal to Ulnar Styloid Process 13.8 cm    Across Hand at [employer] 17.5 cm    At [place] of 2nd Digit 5.6 cm           L-DEX FLOWSHEETS - 07/18/20 1400      L-DEX LYMPHEDEMA SCREENING   Measurement Type Unilateral    L-DEX MEASUREMENT EXTREMITY Upper Extremity    POSITION  Standing    DOMINANT SIDE Right    At Risk Side Left    BASELINE SCORE (UNILATERAL) 1          The patient was assessed using the L-Dex machine today to produce a lymphedema index baseline score. The patient will be reassessed on a regular basis (typically every 3 months) to obtain new L-Dex scores. If the score is > 6.5 points away from his/her baseline score indicating onset of subclinical lymphedema, it will be recommended to wear a compression garment for 4 weeks, 12 hours per day and then be reassessed. If the score continues to be > 6.5 points from baseline at reassessment, we will initiate lymphedema treatment. Assessing in this manner has a 95% rate of preventing clinically significant lymphedema.       Katina Dung - 07/18/20 0001    Open a tight or new jar No difficulty    Do heavy household chores (wash walls, wash floors) No difficulty    Carry a shopping bag or briefcase No difficulty    Wash your back No difficulty    Use a knife to cut  food No difficulty     Recreational activities in which you take some force or impact through your arm, shoulder, or hand (golf, hammering, tennis) No difficulty    During the past week, to what extent has your arm, shoulder or hand problem interfered with your normal social activities with family, friends, neighbors, or groups? Not at all    During the past week, to what extent has your arm, shoulder or hand problem limited your work or other regular daily activities Not at all    Arm, shoulder, or hand pain. None    Tingling (pins and needles) in your arm, shoulder, or hand None    Difficulty Sleeping No difficulty    DASH Score 0 %            Objective measurements completed on examination: See above findings.        Patient was instructed today in a home exercise program today for post op shoulder range of motion. These included active assist shoulder flexion in sitting, scapular retraction, wall walking with shoulder abduction, and hands behind head external rotation.  She was encouraged to do these twice a day, holding 3 seconds and repeating 5 times when permitted by her physician.           PT Education - 07/18/20 1404    Education Details Lymphedema risk reduction and post op shoulder ROM HEP    Person(s) Educated Patient;Spouse;Parent(s)    Methods Explanation;Demonstration;Handout    Comprehension Returned demonstration;Verbalized understanding               PT Long Term Goals - 07/18/20 1426      PT LONG TERM GOAL #1   Title Patient will demonstrate she has regained full shoulder ROM and function post operatively compared to baselines.    Time 8    Period Weeks    Status New    Target Date 09/12/20           Breast Clinic Goals - 07/18/20 1426      Patient will be able to verbalize understanding of pertinent lymphedema risk reduction practices relevant to her diagnosis specifically related to skin care.   Time 1    Period Days    Status Achieved      Patient will be  able to return demonstrate and/or verbalize understanding of the post-op home exercise program related to regaining shoulder range of motion.   Time 1    Period Days    Status Achieved      Patient will be able to verbalize understanding of the importance of attending the postoperative After Breast Cancer Class for further lymphedema risk reduction education and therapeutic exercise.   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 It measures 8 mm and is located in the lower outer quadrant. It is ER/PR positive and HER2 negative with a Ki67 of 5%. Her multidisciplinary medical team met prior to her assessments to determine a recommended treatment plan. She is planning to have a left lumpectomy and sentinel node biopsy followed by Oncotype testing if > 1 cm or if grade II followed by radiation and anti-estrogen therapy. She will benefit from a post op PT reassessment to determine needs and from L-Dex screens  every 3 months for 2 years to detect subclinical lymphedema.    Stability/Clinical Decision Making Stable/Uncomplicated    Clinical Decision Making Low    Rehab Potential Excellent    PT Frequency --   Eval and 1 f/u visit   PT Treatment/Interventions ADLs/Self Care Home Management;Therapeutic exercise;Patient/family education    PT Next Visit Plan Will reassess 3-4 weeks post op    PT Home Exercise Plan Post op shoulder ROM HEP    Consulted and Agree with Plan of Care Patient;Family member/caregiver    Family Member Consulted Husband / Mom           Patient will benefit from skilled therapeutic intervention in order to improve the following deficits and impairments:  Postural dysfunction,Decreased range of motion,Impaired UE functional use,Pain,Decreased knowledge of precautions  Visit Diagnosis: Malignant neoplasm  of lower-outer quadrant of left breast of female, estrogen receptor positive ([place]) - Plan: PT plan of care cert/re-cert  Abnormal posture - Plan: PT plan of care cert/re-cert   Patient will follow up at outpatient cancer rehab 3-4 weeks following surgery.  If the patient requires physical therapy at that time, a specific plan will be dictated and sent to the referring physician for approval. The patient was educated today on appropriate basic range of motion exercises to begin post operatively and the importance of attending the After Breast Cancer class following surgery.  Patient was educated today on lymphedema risk reduction practices as it pertains to recommendations that will benefit the patient immediately following surgery.  She verbalized good understanding.      Problem List Patient Active Problem List   Diagnosis Date Noted  . Invasive ductal carcinoma of breast, left ([place]) 07/13/2020  . Malignant neoplasm of lower-outer quadrant of left breast of female, estrogen receptor positive ([place]) 07/13/2020  . Acute appendicitis 03/25/2020  . Pain in left hip 07/07/2019  . Hip pain 01/12/2019  . Chronic pain of both knees 01/10/2019  . Back pain 03/05/2018  . Neck pain 03/05/2018  . Symptomatic mammary hypertrophy 03/05/2018  . Dyspepsia 01/27/2018  . Vitamin D deficiency 01/19/2018  . Vitamin B12 deficiency 01/19/2018  . Seasonal allergies 07/02/2015  . Encounter for long-term (current) use of medications 07/02/2015  . Obesity (BMI 30-39.9) 07/02/2015  . GERD (gastroesophageal reflux disease) 07/02/2015  . Left hip pain 07/02/2015   Annia Friendly, PT 07/18/20 4:32 PM  [place], [place], [address] Phone: [phone]   Fax:  [phone]  Name: Abeeha Twist MRN: [id number] Date of Birth: Sep 21, 1970

## 2020-07-18 NOTE — Patient Instructions (Signed)

## 2020-07-18 NOTE — Progress Notes (Signed)
Radiation Oncology         [phone] ________________________________  Name: Rebecca Morrison        MRN: [id number]  Date of Service: 07/18/2020 DOB: [birth year]  YI:RSWNIO, Reinaldo Raddle, DO  Rebecca Kussmaul, MD     REFERRING PHYSICIAN: Autumn Messing III, MD   DIAGNOSIS: The encounter diagnosis was Invasive ductal carcinoma of breast, left ([place]).   HISTORY OF PRESENT ILLNESS: Rebecca Morrison is a 50 y.o. female seen in the multidisciplinary breast clinic for a new diagnosis of left breast cancer. The patient was noted to have a palpable mass in the left breast.  She has a previous history of bilateral breast reduction in 2021.  Further diagnostic imaging showed in the mass in the left breast in the 4 o'clock position, by ultrasound this measured 8 mm, and her axilla was negative for adenopathy. Additional imaging of the right breast was also performed and showed a 11 mm cyst in the 4 o'clock position consistent with a benign change.  She did undergo biopsy of the left breast on 07/10/2020 which revealed an invasive ductal carcinoma grade 1-2 in the lower outer quadrant that was ER/PR positive HER2 was negative and Ki-67 was 5%.  She is seen today to discuss treatment recommendations of her cancer.     PREVIOUS RADIATION THERAPY: No   PAST MEDICAL HISTORY:  Past Medical History:  Diagnosis Date  . Allergy   . Anemia    pt states iron deficient  . Cyst of right breast 06/19/2014   US/mamm, confirmed cyst 5mm  . Depression with anxiety 01/18/2018  . Fatty liver   . Gallbladder polyp   . Gallstone   . GERD (gastroesophageal reflux disease)   . Hiatal hernia    3/1//2016 Dr. Gala Lewandowsky  . Internal hemorrhoids   . Menorrhagia   . Murmur    Patient states echocardiogram was normal  . Reflux esophagitis   . Tinnitus    ENT evaluated       PAST SURGICAL HISTORY: Past Surgical History:  Procedure Laterality Date  . BREAST REDUCTION SURGERY Bilateral 02/2019  . COLONOSCOPY  07/10/2011   rectal  bleeding, FHX. Normal colonoscopy.   . ESOPHAGOGASTRODUODENOSCOPY  03/27/2013   hiatal hernia/GERD     FAMILY HISTORY:  Family History  Problem Relation Age of Onset  . Arthritis Mother   . Osteoporosis Mother        "severe"  . Cancer Sister   . Kidney cancer Sister   . Colon cancer Maternal Grandmother   . Ovarian cancer Maternal Grandmother   . Bone cancer Maternal Grandfather   . Heart disease Maternal Grandfather   . Breast cancer Paternal Grandmother   . Rheum arthritis Maternal Great-grandmother      SOCIAL HISTORY:  reports that she has quit smoking. She has never used smokeless tobacco. She reports current alcohol use of about 5.0 - 6.0 standard drinks of alcohol per week. She reports that she does not use drugs.  The patient is married and lives in [place]. 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   ALLERGIES: Patient has no known allergies.   MEDICATIONS:  Current Outpatient Medications  Medication Sig Dispense Refill  . amphetamine-dextroamphetamine (ADDERALL XR) 10 MG 24 hr capsule Take 1 capsule (10 mg total) by mouth daily. 30 capsule 0  . amphetamine-dextroamphetamine (ADDERALL XR) 20 MG 24 hr capsule Take 1 capsule (20 mg total) by mouth daily. 30 capsule  0  . amphetamine-dextroamphetamine (ADDERALL XR) 20 MG 24 hr capsule Take 1 capsule (20 mg total) by mouth daily. 30 capsule 0  . amphetamine-dextroamphetamine (ADDERALL XR) 20 MG 24 hr capsule Take 1 capsule (20 mg total) by mouth daily. 30 capsule 0  . Cholecalciferol 25 MCG (1000 UT) tablet Take 1 tablet (1,000 Units total) by mouth daily. (Patient not taking: Reported on 05/23/2020) 90 tablet 3  . famotidine (PEPCID) 20 MG tablet Take 1 tablet by mouth daily as needed.    Marland Kitchen FLUoxetine (PROZAC) 10 MG capsule TAKE 1 CAPSULE BY MOUTH EVERY DAY 90 capsule 1  . Glucosamine-Chondroit-Vit C-Mn (GLUCOSAMINE CHONDR 500 COMPLEX PO) Take 1 tablet by mouth as needed.   (Patient not taking: Reported on 05/23/2020)    . Iron Polysacch Cmplx-B12-FA (POLY-IRON 150 FORTE) 150-0.025-1 MG CAPS 1 capsule PO every other day. (Patient not taking: Reported on 05/23/2020) 15 capsule 2  . traZODone (DESYREL) 50 MG tablet TAKE 1 TABLET BY MOUTH EVERYDAY AT BEDTIME (Patient not taking: Reported on 05/23/2020) 90 tablet 1  . vitamin B-12 (V-R VITAMIN B-12) 500 MCG tablet Take 1 tablet (500 mcg total) by mouth daily. (Patient not taking: Reported on 05/23/2020) 90 tablet 3  . Zinc 15 MG CAPS Take 2 capsules by mouth daily. (Patient not taking: Reported on 05/23/2020)     No current facility-administered medications for this encounter.     REVIEW OF SYSTEMS: On review of systems, the patient reports that she is doing pretty well overall. No specific complaints were verbalized.     PHYSICAL EXAM:  Wt Readings from Last 3 Encounters:  05/23/20 176 lb (79.8 kg)  12/29/19 173 lb (78.5 kg)  11/29/19 176 lb (79.8 kg)   Temp Readings from Last 3 Encounters:  05/23/20 98.6 F (37 C) (Oral)  11/29/19 98.6 F (37 C) (Oral)  03/31/19 98.2 F (36.8 C) (Temporal)   BP Readings from Last 3 Encounters:  05/23/20 (!) 144/93  11/29/19 131/86  06/23/19 120/80   Pulse Readings from Last 3 Encounters:  05/23/20 84  11/29/19 70  03/31/19 76    In general this is a well appearing Caucasian female in no acute distress. She's alert and oriented x4 and appropriate throughout the examination. Cardiopulmonary assessment is negative for acute distress and she exhibits normal effort. Bilateral breast exam is deferred.    ECOG = 0  0 - Asymptomatic (Fully active, able to carry on all predisease activities without restriction)  1 - Symptomatic but completely ambulatory (Restricted in physically strenuous activity but ambulatory and able to carry out work of a light or sedentary nature. For example, light housework, office work)  2 - Symptomatic, <50% in bed during the day (Ambulatory  and capable of all self care but unable to carry out any work activities. Up and about more than 50% of waking hours)  3 - Symptomatic, >50% in bed, but not bedbound (Capable of only limited self-care, confined to bed or chair 50% or more of waking hours)  4 - Bedbound (Completely disabled. Cannot carry on any self-care. Totally confined to bed or chair)  5 - Death   Eustace Pen MM, Creech RH, Tormey DC, et al. [phone]). "Toxicity and response criteria of the [facility]". [place] Oncol. 5 (6): 649-55    LABORATORY DATA:  Lab Results  Component Value Date   WBC 5.4 03/31/2019   HGB 13.0 03/31/2019   HCT 38.1 03/31/2019   MCV 92.4 03/31/2019   PLT  135.0 (L) 03/31/2019   Lab Results  Component Value Date   NA 140 11/29/2019   K 4.2 11/29/2019   CL 104 11/29/2019   CO2 25 11/29/2019   Lab Results  Component Value Date   ALT 20 11/29/2019   AST 18 11/29/2019   ALKPHOS 59 09/16/2016   BILITOT 0.5 11/29/2019      RADIOGRAPHY: US BREAST LTD UNI LEFT INC AXILLA  Result Date: 07/04/2020 CLINICAL DATA:  Patient describes a palpable lump within the 8 o'clock position of the LEFT breast, just above breast reduction scar. EXAM: DIGITAL DIAGNOSTIC UNILATERAL LEFT MAMMOGRAM WITH TOMOSYNTHESIS AND CAD; ULTRASOUND LEFT BREAST LIMITED TECHNIQUE: Left digital diagnostic mammography and breast tomosynthesis was performed. The images were evaluated with computer-aided detection.; Targeted ultrasound examination of the left breast was performed COMPARISON:  Previous exam(s). ACR Breast Density Category b: There are scattered areas of fibroglandular density. FINDINGS: LEFT breast diagnostic mammogram: There are expected stable postsurgical changes within the inner LEFT breast, with overlying skin marker in place. Ultrasound will be performed for further characterization. There is a spiculated mass within the outer LEFT breast, measuring approximately 8 mm greatest dimension,  corresponding as an incidental finding. Ultrasound will be performed for further characterization. Targeted ultrasound is performed, showing an irregular hypoechoic mass in the LEFT breast at the 4 o'clock axis, 8 cm from the nipple, with anti parallel orientation, measuring 8 x 6 x 8 mm, corresponding to the mammographic finding. LEFT axilla was evaluated with ultrasound showing no enlarged or morphologically abnormal lymph nodes. IMPRESSION: 1. Irregular/spiculated mass within the LEFT breast at the 4 o'clock axis, 8 cm from the nipple, measuring 8 mm. This is a suspicious finding for which ultrasound-guided biopsy is recommended. 2. Expected postsurgical changes within the inner LEFT breast, related to previous breast reduction surgery, corresponding to the palpable area of concern. RECOMMENDATION: Ultrasound-guided biopsy for the LEFT breast mass at the 4 o'clock axis. Ultrasound-guided biopsy is scheduled for May 17th. I have discussed the findings and recommendations with the patient. If applicable, a reminder letter will be sent to the patient regarding the next appointment. BI-RADS CATEGORY  4: Suspicious. Electronically Signed   By: Franki Cabot M.D.   On: 07/04/2020 16:13   US BREAST LTD UNI RIGHT INC AXILLA  Result Date: 07/12/2020 CLINICAL DATA:  Patient with recent diagnosis of left breast cancer. Patient presents for palpable abnormality within the medial right breast. EXAM: DIGITAL DIAGNOSTIC UNILATERAL RIGHT MAMMOGRAM WITH TOMOSYNTHESIS AND CAD; ULTRASOUND RIGHT BREAST LIMITED TECHNIQUE: Right digital diagnostic mammography and breast tomosynthesis was performed. The images were evaluated with computer-aided detection.; Targeted ultrasound examination of the right breast was performed COMPARISON:  Previous exam(s). ACR Breast Density Category b: There are scattered areas of fibroglandular density. FINDINGS: Postreduction changes within the right breast. No new masses, calcifications or  nonsurgical distortion identified. On physical exam, dense tissue is palpated within the medial right breast. Targeted ultrasound is performed, showing a 6 x 8 x 11 mm cyst right breast 4 o'clock position 9 cm from nipple compatible with benign process at the site of palpable concern. IMPRESSION: No mammographic evidence for malignancy.  Post reduction changes. RECOMMENDATION: Treatment plan for known left breast malignancy. Screening of the right breast in conjunction with imaging of the left breast as indicated based upon surgical treatment of the left breast. I have discussed the findings and recommendations with the patient. If applicable, a reminder letter will be sent to the patient regarding the next appointment. BI-RADS  CATEGORY  2: Benign. Electronically Signed   By: Lovey Newcomer M.D.   On: 07/12/2020 16:06   MM DIAG BREAST TOMO UNI LEFT  Result Date: 07/04/2020 CLINICAL DATA:  Patient describes a palpable lump within the 8 o'clock position of the LEFT breast, just above breast reduction scar. EXAM: DIGITAL DIAGNOSTIC UNILATERAL LEFT MAMMOGRAM WITH TOMOSYNTHESIS AND CAD; ULTRASOUND LEFT BREAST LIMITED TECHNIQUE: Left digital diagnostic mammography and breast tomosynthesis was performed. The images were evaluated with computer-aided detection.; Targeted ultrasound examination of the left breast was performed COMPARISON:  Previous exam(s). ACR Breast Density Category b: There are scattered areas of fibroglandular density. FINDINGS: LEFT breast diagnostic mammogram: There are expected stable postsurgical changes within the inner LEFT breast, with overlying skin marker in place. Ultrasound will be performed for further characterization. There is a spiculated mass within the outer LEFT breast, measuring approximately 8 mm greatest dimension, corresponding as an incidental finding. Ultrasound will be performed for further characterization. Targeted ultrasound is performed, showing an irregular hypoechoic mass  in the LEFT breast at the 4 o'clock axis, 8 cm from the nipple, with anti parallel orientation, measuring 8 x 6 x 8 mm, corresponding to the mammographic finding. LEFT axilla was evaluated with ultrasound showing no enlarged or morphologically abnormal lymph nodes. IMPRESSION: 1. Irregular/spiculated mass within the LEFT breast at the 4 o'clock axis, 8 cm from the nipple, measuring 8 mm. This is a suspicious finding for which ultrasound-guided biopsy is recommended. 2. Expected postsurgical changes within the inner LEFT breast, related to previous breast reduction surgery, corresponding to the palpable area of concern. RECOMMENDATION: Ultrasound-guided biopsy for the LEFT breast mass at the 4 o'clock axis. Ultrasound-guided biopsy is scheduled for May 17th. I have discussed the findings and recommendations with the patient. If applicable, a reminder letter will be sent to the patient regarding the next appointment. BI-RADS CATEGORY  4: Suspicious. Electronically Signed   By: Franki Cabot M.D.   On: 07/04/2020 16:13   MM DIAG BREAST TOMO UNI RIGHT  Result Date: 07/12/2020 CLINICAL DATA:  Patient with recent diagnosis of left breast cancer. Patient presents for palpable abnormality within the medial right breast. EXAM: DIGITAL DIAGNOSTIC UNILATERAL RIGHT MAMMOGRAM WITH TOMOSYNTHESIS AND CAD; ULTRASOUND RIGHT BREAST LIMITED TECHNIQUE: Right digital diagnostic mammography and breast tomosynthesis was performed. The images were evaluated with computer-aided detection.; Targeted ultrasound examination of the right breast was performed COMPARISON:  Previous exam(s). ACR Breast Density Category b: There are scattered areas of fibroglandular density. FINDINGS: Postreduction changes within the right breast. No new masses, calcifications or nonsurgical distortion identified. On physical exam, dense tissue is palpated within the medial right breast. Targeted ultrasound is performed, showing a 6 x 8 x 11 mm cyst right  breast 4 o'clock position 9 cm from nipple compatible with benign process at the site of palpable concern. IMPRESSION: No mammographic evidence for malignancy.  Post reduction changes. RECOMMENDATION: Treatment plan for known left breast malignancy. Screening of the right breast in conjunction with imaging of the left breast as indicated based upon surgical treatment of the left breast. I have discussed the findings and recommendations with the patient. If applicable, a reminder letter will be sent to the patient regarding the next appointment. BI-RADS CATEGORY  2: Benign. Electronically Signed   By: Lovey Newcomer M.D.   On: 07/12/2020 16:06   MM CLIP PLACEMENT LEFT  Result Date: 07/10/2020 CLINICAL DATA:  Confirmation of clip placement after ultrasound-guided core needle biopsy of a mass involving the LOWER OUTER  QUADRANT of the LEFT breast. EXAM: 2D AND 3D DIAGNOSTIC LEFT MAMMOGRAM POST ULTRASOUND BIOPSY COMPARISON:  Previous exam(s). FINDINGS: Tomosynthesis and synthesized full field CC and mediolateral images were obtained following ultrasound guided biopsy of a mass involving the LOWER OUTER QUADRANT of the LEFT breast. The ribbon shaped biopsy marking clip is appropriately positioned within the biopsied mass in the [place]. Expected post biopsy changes are present without evidence of hematoma. IMPRESSION: Appropriate positioning of the ribbon shaped biopsy marking clip within the biopsied mass in the LOWER OUTER QUADRANT of the LEFT breast. Final Assessment: Post Procedure Mammograms for Marker Placement Electronically Signed   By: Evangeline Dakin M.D.   On: 07/10/2020 15:03   [place] LT BREAST BX W LOC DEV 1ST LESION IMG BX SPEC US GUIDE  Addendum Date: 07/12/2020   ADDENDUM REPORT: 07/12/2020 09:36 ADDENDUM: Pathology revealed GRADE I-II INVASIVE MAMMARY CARCINOMA of the LEFT breast, lower outer quadrant, 4:00 o'clock, 8 cmfn, (ribbon clip). E-Cadherin immunohistochemistry is strongly positive  consistent with ductal carcinoma. This was found to be concordant by Dr. Peggye Fothergill. Pathology results were discussed with the patient by telephone. The patient reported doing well after the biopsy with tenderness at the site. Post biopsy instructions and care were reviewed and questions were answered. The patient was encouraged to call [facility] for any additional concerns. My direct phone number was provided. The patient was referred to [facility] on Jul 18, 2020. The patient is scheduled for a RIGHT diagnostic mammogram at [facility] on Jul 12, 2020. Pathology results reported by Terie Purser, RN on 07/12/2020. Electronically Signed   By: Evangeline Dakin M.D.   On: 07/12/2020 09:36   Result Date: 07/12/2020 CLINICAL DATA:  50 year old with a mammographically detected 8 mm mass involving the LOWER OUTER QUADRANT of the LEFT breast at the 4 o'clock position approximately 8 cm from the nipple. EXAM: ULTRASOUND GUIDED LEFT BREAST CORE NEEDLE BIOPSY COMPARISON:  Previous exam(s). PROCEDURE: I met with the patient and we discussed the procedure of ultrasound-guided biopsy, including benefits and alternatives. We discussed the high likelihood of a successful procedure. We discussed the risks of the procedure, including infection, bleeding, tissue injury, clip migration, and inadequate sampling. Informed written consent was given. The usual time-out protocol was performed immediately prior to the procedure. Lesion quadrant: LOWER OUTER QUADRANT. Using sterile technique with chlorhexidine as skin antisepsis, 1% lidocaine and 1% lidocaine with epinephrine as local anesthetic, under direct ultrasound visualization, a 12 gauge Bard Marquee core needle device placed through an 11 gauge introducer needle was used to perform biopsy of the mass in the LOWER OUTER QUADRANT of the LEFT breast using a lateral  approach. At the conclusion of the procedure a ribbon shaped tissue marker clip was deployed into the biopsy cavity. Follow up 2 view mammogram was performed and dictated separately. IMPRESSION: Ultrasound guided biopsy of a mass involving the LOWER OUTER QUADRANT the LEFT breast. No apparent complications. Electronically Signed: By: Evangeline Dakin M.D. On: 07/10/2020 15:03       IMPRESSION/PLAN: 1. Stage IA, [id number] grade 1-2 invasive ductal carcinoma of the left breast. Dr. Lisbeth Renshaw discusses the pathology findings and reviews the nature of early left breast disease. The consensus from the breast conference includes breast conservation with lumpectomy with sentinel node biopsy. Dr. Lindi Adie plans to proceed with MRI imaging of the breasts and of the liver as she had an  outside scan showing some changes in the liver. Provided she does not have nay concerns of spread, surgery would occur first. Depending on the size of the final tumor measurements rendered by pathology, the tumor may be tested for Oncotype Dx score to determine a role for systemic therapy. Provided that chemotherapy is not indicated, the patient's course would then be followed by external radiotherapy to the breast  to reduce risks of local recurrence followed by antiestrogen therapy. We discussed the risks, benefits, short, and long term effects of radiotherapy, as well as the curative intent, and the patient is interested in proceeding. Dr. Lisbeth Renshaw discusses the delivery and logistics of radiotherapy and anticipates a course of 6 1/2 weeks of radiotherapy to the left breast with deep inspiration breath-hold technique. Her genetic testing results may influence surgical decision making as well so we did discuss that if she opted for mastectomy Dr. Lisbeth Renshaw would not likely anticipate radiotherapy but this would be confirmed with final pathology. Otherwise, we will see her back a few weeks after surgery to discuss the simulation process and anticipate  we starting radiotherapy about 4-6 weeks after surgery.  2. Possible genetic predisposition to malignancy. The patient is a candidate for genetic testing given her personal and family history. She was offered referral and will meet genetic testing today.    In a visit lasting 60 minutes, greater than 50% of the time was spent face to face reviewing her case, as well as in preparation of, discussing, and coordinating the patient's care.  The above documentation reflects my direct findings during this shared patient visit. Please see the separate note by Dr. Lisbeth Renshaw on this date for the remainder of the patient's plan of care.    Carola Rhine, [facility]    **Disclaimer: This note was dictated with voice recognition software. Similar sounding words can inadvertently be transcribed and this note may contain transcription errors which may not have been corrected upon publication of note.**

## 2020-07-18 NOTE — Assessment & Plan Note (Addendum)
07/10/2020:Patient palpated a left breast lump at the 8 o'clock position above a scar from previous breast reduction surgery. Diagnostic mammogram and US showed a 0.8cm left breast mass at the 8 o'clock position and no left axillary adenopathy. Biopsy showed IDC, grade 1/2, HER-2 equivocal by IHC, negative by FISH (ratio 1.14), ER+ 95%, PR+ 90%, Ki67 5%.  Pathology and radiology counseling:Discussed with the patient, the details of pathology including the type of breast cancer,the clinical staging, the significance of ER, PR and HER-2/neu receptors and the implications for treatment. After reviewing the pathology in detail, we proceeded to discuss the different treatment options between surgery, radiation, chemotherapy, antiestrogen therapies.  Recommendations: 1. Breast conserving surgery followed by 2. Oncotype DX testing to determine if chemotherapy would be of any benefit followed by 3. Adjuvant radiation therapy followed by 4. Adjuvant antiestrogen therapy  Oncotype counseling: I discussed Oncotype DX test. I explained to the patient that this is a 21 gene panel to evaluate patient tumors DNA to calculate recurrence score. This would help determine whether patient has high risk or low risk breast cancer. She understands that if her tumor was found to be high risk, she would benefit from systemic chemotherapy. If low risk, no need of chemotherapy.  Return to clinic after surgery to discuss final pathology report and then determine if Oncotype DX testing will need to be sent.

## 2020-07-19 ENCOUNTER — Encounter: Payer: Self-pay | Admitting: Genetic Counselor

## 2020-07-19 ENCOUNTER — Encounter: Payer: Self-pay | Admitting: General Practice

## 2020-07-19 DIAGNOSIS — Z8041 Family history of malignant neoplasm of ovary: Secondary | ICD-10-CM | POA: Insufficient documentation

## 2020-07-19 DIAGNOSIS — Z8051 Family history of malignant neoplasm of kidney: Secondary | ICD-10-CM | POA: Insufficient documentation

## 2020-07-19 DIAGNOSIS — Z806 Family history of leukemia: Secondary | ICD-10-CM | POA: Insufficient documentation

## 2020-07-19 DIAGNOSIS — Z800 Family history of malignant neoplasm of digestive organs: Secondary | ICD-10-CM | POA: Insufficient documentation

## 2020-07-19 DIAGNOSIS — Z803 Family history of malignant neoplasm of breast: Secondary | ICD-10-CM | POA: Insufficient documentation

## 2020-07-19 NOTE — Progress Notes (Signed)
REFERRING PROVIDER: [name], MD [place],  [place] [id number]  PRIMARY PROVIDER:  Howard Pouch A, DO  PRIMARY REASON FOR VISIT:  1. Malignant neoplasm of lower-outer quadrant of left breast of female, estrogen receptor positive ([place])   2. Family history of kidney cancer   3. Family history of colon cancer   4. Family history of ovarian cancer   5. Family history of breast cancer   6. Family history of leukemia      HISTORY OF PRESENT ILLNESS:   Ms. Michelsen, a 50 y.o. female, was seen for a [facility] cancer genetics consultation at the request of Dr. Lindi Adie due to a personal and family history of cancer.  Ms. Dai presents to clinic today to discuss the possibility of a hereditary predisposition to cancer, genetic testing, and to further clarify her future cancer risks, as well as potential cancer risks for family members.   In May of 2022, at the age of 27, Ms. Groeneveld was diagnosed with invasive ductal carcinoma of the left breast. The tumor is ER+/PR+/Her2-. The treatment plan includes surgery, Oncotype DX testing based on final tumor characteristics, adjuvant radiation therapy, and adjuvant antiestrogen therapy.    CANCER HISTORY:  Oncology History  Malignant neoplasm of lower-outer quadrant of left breast of female, estrogen receptor positive ([place])  07/10/2020 Initial Diagnosis   Patient palpated a left breast lump at the 8 o'clock position above a scar from previous breast reduction surgery. Diagnostic mammogram and US showed a 0.8cm left breast mass at the 8 o'clock position and no left axillary adenopathy. Biopsy showed IDC, grade 1/2, HER-2 equivocal by IHC, negative by FISH (ratio 1.14), ER+ 95%, PR+ 90%, Ki67 5%.   07/18/2020 Cancer Staging   Staging form: Breast, AJCC 8th Edition - Clinical stage from 07/18/2020: Stage IA (cT1b, cN0, cM0, G2, ER+, PR+, HER2-) - Signed by [name], MD on 07/18/2020 Stage prefix: Initial diagnosis Method of  lymph node assessment: Clinical Histologic grading system: 3 grade system      Past Medical History:  Diagnosis Date  . Allergy   . Anemia    pt states iron deficient  . Cyst of right breast 06/19/2014   US/mamm, confirmed cyst 28mm  . Depression with anxiety 01/18/2018  . Family history of breast cancer   . Family history of colon cancer   . Family history of kidney cancer   . Family history of leukemia   . Family history of ovarian cancer   . Fatty liver   . Gallbladder polyp   . Gallstone   . GERD (gastroesophageal reflux disease)   . Hiatal hernia    3/1//2016 Dr. Gala Lewandowsky  . Internal hemorrhoids   . Menorrhagia   . Murmur    Patient states echocardiogram was normal  . Reflux esophagitis   . Tinnitus    ENT evaluated    Past Surgical History:  Procedure Laterality Date  . BREAST REDUCTION SURGERY Bilateral 02/2019  . COLONOSCOPY  07/10/2011   rectal bleeding, FHX. Normal colonoscopy.   . ESOPHAGOGASTRODUODENOSCOPY  03/27/2013   hiatal hernia/GERD    Social History   Socioeconomic History  . Marital status: Married    Spouse name: Not on file  . Number of children: 2  . Years of education: Not on file  . Highest education level: Not on file  Occupational History  . Not on file  Tobacco Use  . Smoking status: Former Research scientist (life sciences)  . Smokeless tobacco: Never Used  Vaping  Use  . Vaping Use: Never used  Substance and Sexual Activity  . Alcohol use: Yes    Alcohol/week: 5.0 - 6.0 standard drinks    Types: 5 - 6 Glasses of wine per week  . Drug use: No  . Sexual activity: Yes    Birth control/protection: Surgical    Comment: partner-vasectomy  Other Topics Concern  . Not on file  Social History Narrative   Married, husband [name]. 2 children [name] in [place].   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   Former smoker, quit 2003.   Social alcohol use, no drug use.   Drinks caffeinated beverages, uses herbal remedies,  takes a daily vitamin.   Wears her seatbelt, wears a bicycle helmet, exercises at least 3 times a week.   Smoke detector in the home, feels safe in her relationships.   Social Determinants of Health   Financial Resource Strain: Not on file  Food Insecurity: Not on file  Transportation Needs: Not on file  Physical Activity: Not on file  Stress: Not on file  Social Connections: Not on file     FAMILY HISTORY:  We obtained a detailed, 4-generation family history.  Significant diagnoses are listed below: Family History  Problem Relation Age of Onset  . Arthritis Mother   . Osteoporosis Mother        "severe"  . Kidney cancer Sister 47  . Colon cancer Maternal Grandmother 65  . Ovarian cancer Maternal Grandmother 29  . Bone cancer Maternal Grandfather 63  . Heart disease Maternal Grandfather   . Breast cancer Paternal Grandmother        dx >50  . Rheum arthritis Maternal Great-grandmother   . Leukemia Maternal Great-grandfather 26   Ms. Ruest has two sons (ages 21 and 22). She has one full-sister (age 63) who had kidney cancer at age 38. She also has one paternal half-brother and three paternal half-sisters, although she has limited information about these half-siblings.  Ms. Erney mother is alive at age 25 without cancer. There is one maternal uncle, who has not had cancer. Ms. Siebels maternal grandmother died at age 23 with colon cancer and ovarian cancer (diagnosed at age 27 or 21). Her maternal grandfather died at age 51 with bone cancer (diagnosed age 4). There is also a maternal great-grandfather (MGM's father) who was diagnosed with leukemia at age 56.  Ms. Neuberger father died at age 11 without cancer. There were four paternal aunts and two paternal uncles. Ms. Yust has limited information about these family members. Ms. Bechler paternal grandmother died older than 92 and had breast cancer (diagnosed older than 53). Her paternal grandfather died in his 64s without  cancer.   Ms. Basden is unaware of previous family history of genetic testing for hereditary cancer risks. Patient's maternal ancestors are of [place] and Native American descent, and paternal ancestors are of [place] descent. There is no reported Ashkenazi Jewish ancestry. There is no known consanguinity.  GENETIC COUNSELING ASSESSMENT: Ms. Walmer is a 50 y.o. female with a personal history of breast cancer and a family history of kidney cancer, colon cancer, ovarian cancer, breast cancer, leukemia, and bone cancer, which is somewhat suggestive of a hereditary cancer syndrome and predisposition to cancer. We, therefore, discussed and recommended the following at today's visit.   DISCUSSION: We discussed that approximately 5-10% of breast cancer is hereditary, with most cases associated with the BRCA1 and BRCA2 genes. There are other genes that can be associated  with hereditary breast cancer syndromes. These include ATM, CHEK2, PALB2 ,etc. We discussed that testing is beneficial for several reasons, including knowing about other cancer risks, identifying potential screening and risk-reduction options that may be appropriate, and to understand if other family members could be at risk for cancer and allow them to undergo genetic testing.  We reviewed the characteristics, features and inheritance patterns of hereditary cancer syndromes. We also discussed genetic testing, including the appropriate family members to test, the process of testing, insurance coverage and turn-around-time for results. We discussed the implications of a negative, positive and/or variant of uncertain significant result. In order to get genetic test results in a timely manner so that Ms. Docken can use these genetic test results for surgical decisions, we recommended Ms. Chandra pursue genetic testing for the [facility]. Once complete, we recommend Ms. Dhami pursue reflex genetic testing to the CancerNext-Expanded + RNAinsight  gene panel.   The BRCAplus panel offered by [employer] and includes sequencing and deletion/duplication analysis for the following 8 genes: ATM, BRCA1, BRCA2, CDH1, CHEK2, PALB2, PTEN, and TP53. The CancerNext-Expanded + RNAinsight gene panel offered by [employer] and includes sequencing and rearrangement analysis for the following 77 genes: AIP, ALK, APC, ATM, AXIN2, BAP1, BARD1, BLM, BMPR1A, BRCA1, BRCA2, BRIP1, CDC73, CDH1, CDK4, CDKN1B, CDKN2A, CHEK2, CTNNA1, DICER1, FANCC, FH, FLCN, GALNT12, KIF1B, LZTR1, MAX, MEN1, MET, MLH1, MSH2, MSH3, MSH6, MUTYH, NBN, NF1, NF2, NTHL1, PALB2, PHOX2B, PMS2, POT1, PRKAR1A, PTCH1, PTEN, RAD51C, RAD51D, RB1, RECQL, RET, SDHA, SDHAF2, SDHB, SDHC, SDHD, SMAD4, SMARCA4, SMARCB1, SMARCE1, STK11, SUFU, TMEM127, TP53, TSC1, TSC2, VHL and XRCC2 (sequencing and deletion/duplication); EGFR, EGLN1, HOXB13, KIT, MITF, PDGFRA, POLD1 and POLE (sequencing only); EPCAM and GREM1 (deletion/duplication only). RNA data is routinely analyzed for use in variant interpretation for all genes.  Based on Ms. Weissmann's personal and family history of cancer, she meets medical criteria for genetic testing. Despite that she meets criteria, she may still have an out of pocket cost.   PLAN: After considering the risks, benefits, and limitations, Ms. Waterbury provided informed consent to pursue genetic testing and the blood sample was sent to [facility] for analysis of the BRCAplus and CancerNext-Expanded+RNAinsight panels. Results should be available within approximately one-two weeks' time, at which point they will be disclosed by telephone to Ms. Berringer, as will any additional recommendations warranted by these results. Ms. Depaoli will receive a summary of her genetic counseling visit and a copy of her results once available. This information will also be available in Epic.   Ms. Mikels questions were answered to her satisfaction today. Our contact information was provided  should additional questions or concerns arise. Thank you for the referral and allowing [place] to share in the care of your patient.   Clint Guy, [place], [facility] Licensed, Certified Dispensing [email or web address] Phone: [phone]  The patient was seen for a total of 20 minutes in face-to-face genetic counseling.  This patient was discussed with Drs. Magrinat, Lindi Adie and/or Burr Medico who agrees with the above.    _______________________________________________________________________ For Office Staff:  Number of people involved in session: 1 Was an Intern/ student involved with case: no

## 2020-07-19 NOTE — Progress Notes (Signed)
[facility] Psychosocial Distress Screening Spiritual Care  Left voicemail for [facility] following Breast Multidisciplinary Clinic to introduce [name] team/resources, reviewing distress screen per protocol.  The patient scored a 2 on the Psychosocial Distress Thermometer which indicates mild distress. Also assessed for distress and other psychosocial needs.   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 Further, she describes herself as "very faithful." Encouraged return call for information about support resources and Spiritual Care.   Follow up needed: No.   Chaplain Lorrin Jackson, [place], [facility] Pager [phone] Voicemail [phone]

## 2020-07-20 ENCOUNTER — Telehealth: Payer: Self-pay | Admitting: *Deleted

## 2020-07-20 NOTE — Telephone Encounter (Signed)
Left message for patient that GI did not have any appointments for her MRI later in the day on 6/1.

## 2020-07-24 DIAGNOSIS — Z1379 Encounter for other screening for genetic and chromosomal anomalies: Secondary | ICD-10-CM | POA: Insufficient documentation

## 2020-07-25 ENCOUNTER — Encounter: Payer: Self-pay | Admitting: Hematology and Oncology

## 2020-07-25 ENCOUNTER — Telehealth: Payer: Self-pay | Admitting: Genetic Counselor

## 2020-07-25 ENCOUNTER — Other Ambulatory Visit: Payer: Self-pay

## 2020-07-25 ENCOUNTER — Ambulatory Visit
Admission: RE | Admit: 2020-07-25 | Discharge: 2020-07-25 | Disposition: A | Discharge status: Home / Self Care | Payer: 59 | Source: Ambulatory Visit | Attending: General Surgery | Admitting: General Surgery

## 2020-07-25 DIAGNOSIS — C50512 Malignant neoplasm of lower-outer quadrant of left female breast: Secondary | ICD-10-CM

## 2020-07-25 MED ORDER — GADOBUTROL 1 MMOL/ML IV SOLN
8.0000 mL | Freq: Once | INTRAVENOUS | Status: AC | PRN
  Administered 2020-07-25: 8 mL via INTRAVENOUS

## 2020-07-25 NOTE — Telephone Encounter (Signed)
LVM that her genetic test results are available and requested that she call back to discuss them.  

## 2020-07-26 ENCOUNTER — Telehealth: Payer: Self-pay | Admitting: *Deleted

## 2020-07-26 ENCOUNTER — Encounter: Payer: Self-pay | Admitting: Genetic Counselor

## 2020-07-26 NOTE — Telephone Encounter (Signed)
Received VM from pt.  Attempt x1 to return call, no answer. LVM to return call to the office.

## 2020-07-26 NOTE — Telephone Encounter (Signed)
Revealed negative genetic testing through the [facility] panel. Discussed that we do not know why she has breast cancer. It is possible that there could be a mutation in a different gene that we are not testing, or our current technology may not be able to detect certain mutations. It will therefore be important for her to stay in contact with genetics to keep up with whether additional testing may be appropriate in the future.   Additional genetic testing through the [facility] CancerNext-Expanded + RNAinsight panel is currently pending. We will call Rebecca Morrison once these results are available.

## 2020-07-27 ENCOUNTER — Encounter: Payer: Self-pay | Admitting: *Deleted

## 2020-07-27 ENCOUNTER — Telehealth: Payer: Self-pay | Admitting: *Deleted

## 2020-07-27 ENCOUNTER — Ambulatory Visit (HOSPITAL_COMMUNITY): Payer: 59

## 2020-07-27 NOTE — Telephone Encounter (Signed)
After multiple attempts was finally able to connect with patient to follow up from [facility] and discuss recent MRI breast results as well as get her MRI liver r/s due to delay in prior auth. Patient states she is not interested in getting another bx of her left breast and is leaning towards bil mast w/reconstruction. She tells me she has seen Dr. Marla Roe before and prefers to see Dr Iran Planas.  Appt made and confirmed with Dr. Iran Planas for 6/6 at 915. I have let Dr. Marlou Starks know this as well.  She is also aware of her MRI liver for 6/4. Encouraged patient to call with

## 2020-07-28 ENCOUNTER — Ambulatory Visit (HOSPITAL_COMMUNITY)
Admission: RE | Admit: 2020-07-28 | Discharge: 2020-07-28 | Disposition: A | Discharge status: Home / Self Care | Payer: 59 | Source: Ambulatory Visit | Attending: Hematology and Oncology | Admitting: Hematology and Oncology

## 2020-07-28 ENCOUNTER — Other Ambulatory Visit: Payer: Self-pay

## 2020-07-28 DIAGNOSIS — C50512 Malignant neoplasm of lower-outer quadrant of left female breast: Secondary | ICD-10-CM | POA: Diagnosis not present

## 2020-07-28 DIAGNOSIS — Z170 Estrogen receptor positive status [ER+]: Secondary | ICD-10-CM | POA: Diagnosis present

## 2020-07-28 MED ORDER — GADOBUTROL 1 MMOL/ML IV SOLN
7.0000 mL | Freq: Once | INTRAVENOUS | Status: AC | PRN
  Administered 2020-07-28: 7 mL via INTRAVENOUS

## 2020-07-30 ENCOUNTER — Encounter: Payer: Self-pay | Admitting: *Deleted

## 2020-07-31 ENCOUNTER — Encounter: Payer: Self-pay | Admitting: General Practice

## 2020-07-31 ENCOUNTER — Ambulatory Visit (HOSPITAL_COMMUNITY): Payer: 59

## 2020-07-31 NOTE — Progress Notes (Signed)
[facility] Spiritual Care Note  Returned voicemail from Rebecca Morrison with another voicemail encouraging return call.   [place], [place], [facility] Pager [phone] Voicemail [phone]

## 2020-08-01 ENCOUNTER — Telehealth: Payer: Self-pay | Admitting: *Deleted

## 2020-08-01 ENCOUNTER — Encounter: Payer: Self-pay | Admitting: *Deleted

## 2020-08-01 ENCOUNTER — Telehealth: Payer: Self-pay | Admitting: Genetic Counselor

## 2020-08-01 NOTE — Telephone Encounter (Signed)
Received voicemail from patient. Called patient and left voicemail for a return phone call regarding questions or needs.

## 2020-08-01 NOTE — Telephone Encounter (Signed)
LVM that her additional genetic test results are available and requested that she call back to discuss them.

## 2020-08-02 ENCOUNTER — Encounter: Payer: Self-pay | Admitting: *Deleted

## 2020-08-02 ENCOUNTER — Telehealth: Payer: Self-pay | Admitting: *Deleted

## 2020-08-02 ENCOUNTER — Ambulatory Visit: Payer: Self-pay | Admitting: Genetic Counselor

## 2020-08-02 DIAGNOSIS — Z1379 Encounter for other screening for genetic and chromosomal anomalies: Secondary | ICD-10-CM

## 2020-08-02 NOTE — Telephone Encounter (Signed)
Revealed negative genetic testing through the [facility] CancerNext-Expanded + RNAinsight panel.

## 2020-08-02 NOTE — Progress Notes (Signed)
HPI:  [name] was previously seen in the [facility] clinic due to a personal and family history of cancer and concerns regarding a hereditary predisposition to cancer. Please refer to our prior cancer genetics clinic note for more information regarding our discussion, assessment and recommendations, at the time. [name] recent genetic test results were disclosed to her, as were recommendations warranted by these results. These results and recommendations are discussed in more detail below.  CANCER HISTORY:  Oncology History  Malignant neoplasm of lower-outer quadrant of left breast of female, estrogen receptor positive ([place])  07/10/2020 Initial Diagnosis   Patient palpated a left breast lump at the 8 o'clock position above a scar from previous breast reduction surgery. Diagnostic mammogram and US showed a 0.8cm left breast mass at the 8 o'clock position and no left axillary adenopathy. Biopsy showed IDC, grade 1/2, HER-2 equivocal by IHC, negative by FISH (ratio 1.14), ER+ 95%, PR+ 90%, Ki67 5%.   07/18/2020 Cancer Staging   Staging form: Breast, AJCC 8th Edition - Clinical stage from 07/18/2020: Stage IA (cT1b, cN0, cM0, G2, ER+, PR+, HER2-) - Signed by [name], MD on 07/18/2020  Stage prefix: Initial diagnosis  Method of lymph node assessment: Clinical  Histologic grading system: 3 grade system    07/24/2020 Genetic Testing   Negative genetic testing:  No pathogenic variants detected on the [facility] BRCAplus panel and CancerNext-Expanded + RNAinsight panel. The report dates are 07/24/2020 and 07/31/2020 respectively.   The BRCAplus panel offered by [employer] and includes sequencing and deletion/duplication analysis for the following 8 genes: ATM, BRCA1, BRCA2, CDH1, CHEK2, PALB2, PTEN, and TP53. The CancerNext-Expanded + RNAinsight gene panel offered by [employer] and includes sequencing and rearrangement analysis for the following 77 genes: AIP, ALK, APC, ATM,  AXIN2, BAP1, BARD1, BLM, BMPR1A, BRCA1, BRCA2, BRIP1, CDC73, CDH1, CDK4, CDKN1B, CDKN2A, CHEK2, CTNNA1, DICER1, FANCC, FH, FLCN, GALNT12, KIF1B, LZTR1, MAX, MEN1, MET, MLH1, MSH2, MSH3, MSH6, MUTYH, NBN, NF1, NF2, NTHL1, PALB2, PHOX2B, PMS2, POT1, PRKAR1A, PTCH1, PTEN, RAD51C, RAD51D, RB1, RECQL, RET, SDHA, SDHAF2, SDHB, SDHC, SDHD, SMAD4, SMARCA4, SMARCB1, SMARCE1, STK11, SUFU, TMEM127, TP53, TSC1, TSC2, VHL and XRCC2 (sequencing and deletion/duplication); EGFR, EGLN1, HOXB13, KIT, MITF, PDGFRA, POLD1 and POLE (sequencing only); EPCAM and GREM1 (deletion/duplication only). RNA data is routinely analyzed for use in variant interpretation for all genes.     FAMILY HISTORY:  We obtained a detailed, 4-generation family history.  Significant diagnoses are listed below: Family History  Problem Relation Age of Onset   Arthritis Mother    Osteoporosis Mother        "severe"   Kidney cancer Sister 4   Colon cancer Maternal Grandmother 80   Ovarian cancer Maternal Grandmother 61   Bone cancer Maternal Grandfather 57   Heart disease Maternal Grandfather    Breast cancer Paternal Grandmother        dx >50   Rheum arthritis Maternal Great-grandmother    Leukemia Maternal Great-grandfather 39   [name] has two sons (ages 62 and 72). She has one full-sister (age 46) who had kidney cancer at age 4. She also has one paternal half-brother and three paternal half-sisters, although she has limited information about these half-siblings.   [name] mother is alive at age 51 without cancer. There is one maternal uncle, who has not had cancer. [name] maternal grandmother died at age 91 with colon cancer and ovarian cancer (diagnosed at age 71 or 7). Her maternal grandfather died at  age 24 with bone cancer (diagnosed age 74). There is also a maternal great-grandfather (MGM's father) who was diagnosed with leukemia at age 54.   [name] father died at age 87 without cancer. There were four  paternal aunts and two paternal uncles. [name] has limited information about these family members. [name] paternal grandmother died older than 59 and had breast cancer (diagnosed older than 96). Her paternal grandfather died in his 64s without cancer.   [name] is unaware of previous family history of genetic testing for hereditary cancer risks. Patient's maternal ancestors are of [place] and Native American descent, and paternal ancestors are of [place] descent. There is no reported Ashkenazi Jewish ancestry. There is no known consanguinity.  GENETIC TEST RESULTS: Genetic testing reported out on 07/24/2020 through the [facility] panel, and 07/31/2020 through the CancerNext-Expanded + RNAinsight panel. No pathogenic variants were detected.   The BRCAplus panel offered by [employer] and includes sequencing and deletion/duplication analysis for the following 8 genes: ATM, BRCA1, BRCA2, CDH1, CHEK2, PALB2, PTEN, and TP53. The CancerNext-Expanded + RNAinsight gene panel offered by [employer] and includes sequencing and rearrangement analysis for the following 77 genes: AIP, ALK, APC, ATM, AXIN2, BAP1, BARD1, BLM, BMPR1A, BRCA1, BRCA2, BRIP1, CDC73, CDH1, CDK4, CDKN1B, CDKN2A, CHEK2, CTNNA1, DICER1, FANCC, FH, FLCN, GALNT12, KIF1B, LZTR1, MAX, MEN1, MET, MLH1, MSH2, MSH3, MSH6, MUTYH, NBN, NF1, NF2, NTHL1, PALB2, PHOX2B, PMS2, POT1, PRKAR1A, PTCH1, PTEN, RAD51C, RAD51D, RB1, RECQL, RET, SDHA, SDHAF2, SDHB, SDHC, SDHD, SMAD4, SMARCA4, SMARCB1, SMARCE1, STK11, SUFU, TMEM127, TP53, TSC1, TSC2, VHL and XRCC2 (sequencing and deletion/duplication); EGFR, EGLN1, HOXB13, KIT, MITF, PDGFRA, POLD1 and POLE (sequencing only); EPCAM and GREM1 (deletion/duplication only). RNA data is routinely analyzed for use in variant interpretation for all genes. The test report will be scanned into EPIC and located under the Molecular Pathology section of the Results Review tab.  A portion of the result report is  included below for reference.     We discussed with [name] that because current genetic testing is not perfect, it is possible there may be a gene mutation in one of these genes that current testing cannot detect, but that chance is small.  We also discussed that there could be another gene that has not yet been discovered, or that we have not yet tested, that is responsible for the cancer diagnoses in the family. It is also possible there is a hereditary cause for the cancer in the family that [name] did not inherit and therefore was not identified in her testing.  Therefore, it is important to remain in touch with cancer genetics in the future so that we can continue to offer [name] the most up to date genetic testing.   ADDITIONAL GENETIC TESTING: We discussed with [name] that her genetic testing was fairly extensive.  If there are genes identified to increase cancer risk that can be analyzed in the future, we would be happy to discuss and coordinate this testing at that time.    CANCER SCREENING RECOMMENDATIONS: [name] test result is considered negative (normal). This means that we have not identified a hereditary cause for her personal and family history of cancer at this time. While reassuring, this does not definitively rule out a hereditary predisposition to cancer. It is still possible that there could be genetic mutations that are undetectable by current technology. There could be genetic mutations in genes that have not been tested or identified to increase cancer risk.  Therefore, it is recommended she continue to follow the cancer management and screening guidelines provided by her oncology and primary healthcare provider.   An individual's cancer risk and medical management are not determined by genetic test results alone. Overall cancer risk assessment incorporates additional factors, including personal medical history, family history, and any available genetic  information that may result in a personalized plan for cancer prevention and surveillance.  RECOMMENDATIONS FOR FAMILY MEMBERS:  Individuals in this family might be at some increased risk of developing cancer, over the general population risk, simply due to the family history of cancer.  We recommended women in this family have a yearly mammogram beginning at age 83, or 53 years younger than the earliest onset of cancer, an annual clinical breast exam, and perform monthly breast self-exams. Women in this family should also have a gynecological exam as recommended by their primary provider. All family members should be referred for colonoscopy starting at age 29.  It is also possible there is a hereditary cause for the cancer in [name]'s family that she did not inherit and therefore was not identified in her.  Based on [name]'s family history, we recommended her sister, who was diagnosed with kidney cancer at age 91, as well as her mother and maternal uncle, have genetic counseling and testing. [name] will let us know if we can be of any assistance in coordinating genetic counseling and/or testing for these family members.   FOLLOW-UP: Lastly, we discussed with [name] that cancer genetics is a rapidly advancing field and it is possible that new genetic tests will be appropriate for her and/or her family members in the future. We encouraged her to remain in contact with cancer genetics on an annual basis so we can update her personal and family histories and let her know of advances in cancer genetics that may benefit this family.   Our contact number was provided. [name] questions were answered to her satisfaction, and she knows she is welcome to call us at anytime with additional questions or concerns.   Clint Guy, MS, [facility] Genetic Counselor [email or web address] Phone: [phone]

## 2020-08-02 NOTE — Telephone Encounter (Signed)
Received message from [facility]:Pt had appt with [facility] yesterday and has decided to go there for care. Team notified.

## 2020-08-15 ENCOUNTER — Telehealth: Payer: Self-pay | Admitting: *Deleted

## 2020-08-15 NOTE — Telephone Encounter (Signed)
Spoke with patient and she is scheduled for sx at [facility] 7/28. She states she may want to see D.r Lindi Adie post op, but she already has an appointment with med onc at [facility]. She will call me back if she decides to Dr. Lindi Adie.

## 2020-08-21 ENCOUNTER — Other Ambulatory Visit: Payer: Self-pay

## 2020-08-21 ENCOUNTER — Telehealth: Payer: Self-pay | Admitting: Adult Health

## 2020-08-21 DIAGNOSIS — F909 Attention-deficit hyperactivity disorder, unspecified type: Secondary | ICD-10-CM

## 2020-08-21 MED ORDER — AMPHETAMINE-DEXTROAMPHET ER 20 MG PO CP24: 20.0000 mg | ORAL_CAPSULE | Freq: Every day | ORAL | 0 refills | Status: DC

## 2020-08-21 NOTE — Telephone Encounter (Signed)
Pended.

## 2020-08-21 NOTE — Telephone Encounter (Signed)
Patient lm requesting a refill on the Adderall. [facility] in [facility]. Patient last seen 3/22. A msg was left to schedule a follow which is due now.

## 2020-08-22 ENCOUNTER — Encounter: Payer: Self-pay | Admitting: Family Medicine

## 2020-08-22 ENCOUNTER — Ambulatory Visit (INDEPENDENT_AMBULATORY_CARE_PROVIDER_SITE_OTHER): Payer: 59 | Admitting: Family Medicine

## 2020-08-22 ENCOUNTER — Other Ambulatory Visit: Payer: Self-pay

## 2020-08-22 VITALS — BP 137/85 | HR 81 | Temp 98.6°F | Ht 63.0 in | Wt 173.0 lb

## 2020-08-22 DIAGNOSIS — C50512 Malignant neoplasm of lower-outer quadrant of left female breast: Secondary | ICD-10-CM | POA: Diagnosis not present

## 2020-08-22 DIAGNOSIS — Z170 Estrogen receptor positive status [ER+]: Secondary | ICD-10-CM

## 2020-08-22 DIAGNOSIS — K2100 Gastro-esophageal reflux disease with esophagitis, without bleeding: Secondary | ICD-10-CM | POA: Diagnosis not present

## 2020-08-22 DIAGNOSIS — R053 Chronic cough: Secondary | ICD-10-CM

## 2020-08-22 MED ORDER — OMEPRAZOLE 40 MG PO CPDR: 40.0000 mg | DELAYED_RELEASE_CAPSULE | Freq: Every day | ORAL | 3 refills | Status: DC

## 2020-08-22 NOTE — Patient Instructions (Signed)

## 2020-08-22 NOTE — Progress Notes (Signed)
This visit occurred during the SARS-CoV-2 public health emergency.  Safety protocols were in place, including screening questions prior to the visit, additional usage of staff PPE, and extensive cleaning of exam room while observing appropriate contact time as indicated for disinfecting solutions.    Rebecca Morrison , 1970/06/14, 50 y.o., female MRN: [id number] Patient Care Team    Relationship Specialty Notifications Start End  [name], DO PCP - General Family Medicine  09/03/15   Esaw Dace, MD Referring Physician Gastroenterology  03/31/19   Darrin Luis, MD Referring Physician Plastic Surgery  03/31/19   Huel Cote, NP (Inactive) Nurse Practitioner Obstetrics and Gynecology  03/31/19   Mauro Kaufmann, RN Oncology Nurse Navigator   07/13/20   Rockwell Germany, RN Oncology Nurse Navigator   07/13/20   Jovita Kussmaul, MD Consulting Physician General Surgery  07/16/20   [name], MD Consulting Physician Hematology and Oncology  07/16/20   [name], MD Consulting Physician Radiation Oncology  07/16/20     Chief Complaint  Patient presents with   Cough    Pt c/o productive cough x 3 mos that has worsen within last 4 weeks; Phx of GERD     Subjective: Pt presents for an OV concerns of chronic cough.  She states she knows she has some cough related to her chronic reflux.  However she is concerned over the length that she has had the chronic cough and its worsened over the last 4 weeks.  She just restarted the omeprazole a few days ago.  She has been just diagnosed with breast cancer and she is undergoing a bilateral mastectomy within the next few weeks.  She reports now she is just worrying about many things and would like to be evaluated to make sure she does not have "throat cancer.  "  Depression screen [facility] 2/9 11/29/2019 03/31/2019 01/18/2018 01/18/2018 03/27/2017  Decreased Interest 0 0 1 0 0  Down, Depressed, Hopeless 0 0 1 0 0  PHQ - 2 Score 0 0 2 0 0  Altered  sleeping - - 3 - -  Tired, decreased energy - - 3 - -  Change in appetite - - 3 - -  Feeling bad or failure about yourself  - - 1 - -  Trouble concentrating - - 1 - -  Moving slowly or fidgety/restless - - 3 - -  Suicidal thoughts - - 0 - -  PHQ-9 Score - - 16 - -  Difficult doing work/chores - - Somewhat difficult - -    No Known Allergies Social History   Social History Narrative   Married, husband Psychologist, clinical. 2 children [name] in [place].   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   Former smoker, quit 2003.   Social alcohol use, no drug use.   Drinks caffeinated beverages, uses herbal remedies, takes a daily vitamin.   Wears her seatbelt, wears a bicycle helmet, exercises at least 3 times a week.   Smoke detector in the home, feels safe in her relationships.   Past Medical History:  Diagnosis Date   Allergy    Anemia    pt states iron deficient   Cyst of right breast 06/19/2014   US/mamm, confirmed cyst 73mm   Depression with anxiety 01/18/2018   Family history of breast cancer    Family history of colon cancer    Family history of kidney cancer    Family history of leukemia  Family history of ovarian cancer    Fatty liver    Gallbladder polyp    Gallstone    GERD (gastroesophageal reflux disease)    Hiatal hernia    3/1//2016 Dr. Gala Lewandowsky   Internal hemorrhoids    Menorrhagia    Murmur    Patient states echocardiogram was normal   Reflux esophagitis    Tinnitus    ENT evaluated   Past Surgical History:  Procedure Laterality Date   BREAST REDUCTION SURGERY Bilateral 02/2019   COLONOSCOPY  07/10/2011   rectal bleeding, FHX. Normal colonoscopy.    ESOPHAGOGASTRODUODENOSCOPY  03/27/2013   hiatal hernia/GERD   Family History  Problem Relation Age of Onset   Arthritis Mother    Osteoporosis Mother        "severe"   Kidney cancer Sister 66   Colon cancer Maternal Grandmother 40   Ovarian cancer Maternal Grandmother 45    Bone cancer Maternal Grandfather 57   Heart disease Maternal Grandfather    Breast cancer Paternal Grandmother        dx >50   Rheum arthritis Maternal Great-grandmother    Leukemia Maternal Great-grandfather 41   Allergies as of 08/22/2020   No Known Allergies      Medication List        Accurate as of August 22, 2020 10:45 AM. If you have any questions, ask your nurse or doctor.          STOP taking these medications    Cholecalciferol 25 MCG (1000 UT) tablet Stopped by: Howard Pouch, DO   phentermine 37.5 MG tablet Commonly known as: ADIPEX-P Stopped by: Howard Pouch, DO   Poly-Iron 150 Forte 150-0.025-1 MG Caps Generic drug: Iron Polysacch Cmplx-B12-FA Stopped by: Howard Pouch, DO   vitamin B-12 500 MCG tablet Commonly known as: V-R VITAMIN B-12 Stopped by: Howard Pouch, DO       TAKE these medications    amphetamine-dextroamphetamine 10 MG 24 hr capsule Commonly known as: Adderall XR Take 1 capsule (10 mg total) by mouth daily.   amphetamine-dextroamphetamine 20 MG 24 hr capsule Commonly known as: Adderall XR Take 1 capsule (20 mg total) by mouth daily.   amphetamine-dextroamphetamine 20 MG 24 hr capsule Commonly known as: Adderall XR Take 1 capsule (20 mg total) by mouth daily.   amphetamine-dextroamphetamine 20 MG 24 hr capsule Commonly known as: Adderall XR Take 1 capsule (20 mg total) by mouth daily.   famotidine 20 MG tablet Commonly known as: PEPCID Take 1 tablet by mouth daily as needed.   FLUoxetine 10 MG capsule Commonly known as: PROZAC TAKE 1 CAPSULE BY MOUTH EVERY DAY   GLUCOSAMINE CHONDR 500 COMPLEX PO Take 1 tablet by mouth as needed.   traZODone 50 MG tablet Commonly known as: DESYREL TAKE 1 TABLET BY MOUTH EVERYDAY AT BEDTIME        All past medical history, surgical history, allergies, family history, immunizations andmedications were updated in the EMR today and reviewed under the history and medication portions of  their EMR.     ROS: Negative, with the exception of above mentioned in HPI   Objective:  BP 137/85   Pulse 81   Temp 98.6 F (37 C) (Oral)   Ht 5\' 3"  (1.6 m)   Wt 173 lb (78.5 kg)   SpO2 98%   BMI 30.65 kg/m  Body mass index is 30.65 kg/m. Gen: Afebrile. No acute distress. Nontoxic in appearance, well developed, well nourished.  Very pleasant female.  Obese.  HENT: AT. [place]. Bilateral TM visualized no erythema, no effusions.  No bulging membrane. . MMM, no oral lesions. Bilateral nares without erythema, swelling. Throat without erythema or exudates.  Very mild postnasal drip, no cough or hoarseness on exam. Eyes:Pupils Equal Round Reactive to light, Extraocular movements intact,  Conjunctiva without redness, discharge or icterus. Chest: CTAB Skin: No rashes, purpura or petechiae.  Neuro:  Normal gait. PERLA. EOMi. Alert. Oriented x3  Psych: Normal affect, dress and demeanor. Normal speech. Normal thought content and judgment.  No results found. No results found. No results found for this or any previous visit (from the past 24 hour(s)).  Assessment/Plan: Farren Landa is a 50 y.o. female present for OV for  GERD/LPR/chronic cough: -Restart omeprazole 40 mg daily.  Continue Pepcid OTC. -Dietary changes encouraged. -We discussed dietary changes, behavioral changes, weight loss for LPR/reflux symptoms. -Discussed referral to ENT, I believe she is hoping to have visualization of throat and vocal cords just to ensure there are not no lesions present. -We discussed pursuing GI referral after her surgeries if symptoms are still present at that time.  - Ambulatory referral to ENT Patient is in agreement with plan above.  Malignant neoplasm of lower-outer quadrant of left breast of female, estrogen receptor positive ([place]) Patiently recently diagnosed with breast cancer and has obvious concerns over other types of malignancies.   Reviewed expectations re: course of current medical  issues. Discussed self-management of symptoms. Outlined signs and symptoms indicating need for more acute intervention. Patient verbalized understanding and all questions were answered. Patient received an After-Visit Summary.    No orders of the defined types were placed in this encounter.  No orders of the defined types were placed in this encounter.  Referral Orders  No referral(s) requested today     Note is dictated utilizing voice recognition software. Although note has been proof read prior to signing, occasional typographical errors still can be missed. If any questions arise, please do not hesitate to call for verification.   electronically signed by:  Howard Pouch, DO  [place]

## 2020-08-24 ENCOUNTER — Encounter: Payer: Self-pay | Admitting: Family Medicine

## 2020-08-24 ENCOUNTER — Other Ambulatory Visit: Payer: Self-pay | Admitting: Adult Health

## 2020-08-24 DIAGNOSIS — F428 Other obsessive-compulsive disorder: Secondary | ICD-10-CM

## 2020-08-24 DIAGNOSIS — F331 Major depressive disorder, recurrent, moderate: Secondary | ICD-10-CM

## 2020-08-24 DIAGNOSIS — F411 Generalized anxiety disorder: Secondary | ICD-10-CM

## 2020-08-24 HISTORY — PX: MASTECTOMY: SHX3

## 2020-09-18 ENCOUNTER — Other Ambulatory Visit: Payer: Self-pay | Admitting: Adult Health

## 2020-09-18 ENCOUNTER — Telehealth: Payer: Self-pay | Admitting: Adult Health

## 2020-09-18 DIAGNOSIS — F411 Generalized anxiety disorder: Secondary | ICD-10-CM

## 2020-09-18 MED ORDER — ALPRAZOLAM 0.25 MG PO TABS: 0.2500 mg | ORAL_TABLET | Freq: Three times a day (TID) | ORAL | 0 refills | Status: DC | PRN

## 2020-09-18 NOTE — Telephone Encounter (Signed)
LVM for pt to return call.Please review

## 2020-09-18 NOTE — Telephone Encounter (Signed)
Script sent for Xanax.

## 2020-09-18 NOTE — Telephone Encounter (Signed)
Pt getting ready to have breast surgery on Thursday. Her anxiety is pff the chart andshe is having panic attacks. Pt would like something sent in to calm her before Thursday.[facility] [place]

## 2020-10-08 ENCOUNTER — Ambulatory Visit (INDEPENDENT_AMBULATORY_CARE_PROVIDER_SITE_OTHER): Payer: 59 | Admitting: Otolaryngology

## 2020-10-09 DIAGNOSIS — Z9013 Acquired absence of bilateral breasts and nipples: Secondary | ICD-10-CM | POA: Insufficient documentation

## 2020-10-10 ENCOUNTER — Ambulatory Visit (INDEPENDENT_AMBULATORY_CARE_PROVIDER_SITE_OTHER): Payer: 59 | Admitting: Otolaryngology

## 2020-10-28 NOTE — Progress Notes (Signed)
Patient Care Team: Natalia Leatherwood, DO as PCP - General (Family Medicine) Rinaldo Ratel, MD as Referring Physician (Gastroenterology) Westley Foots, MD as Referring Physician (Plastic Surgery) Harrington Challenger, NP (Inactive) as Nurse Practitioner (Obstetrics and Gynecology) Pershing Proud, RN as Oncology Nurse Navigator Donnelly Angelica, RN as Oncology Nurse Navigator Griselda Miner, MD as Consulting Physician (General Surgery) Serena Croissant, MD as Consulting Physician (Hematology and Oncology) Dorothy Puffer, MD as Consulting Physician (Radiation Oncology)  DIAGNOSIS:    ICD-10-CM   1. Malignant neoplasm of lower-outer quadrant of left breast of female, estrogen receptor positive (HCC)  C50.512    Z17.0       SUMMARY OF ONCOLOGIC HISTORY: Oncology History  Malignant neoplasm of lower-outer quadrant of left breast of female, estrogen receptor positive (HCC)  07/10/2020 Initial Diagnosis   Patient palpated a left breast lump at the 8 o'clock position above a scar from previous breast reduction surgery. Diagnostic mammogram and US showed a 0.8cm left breast mass at the 8 o'clock position and no left axillary adenopathy. Biopsy showed IDC, grade 1/2, HER-2 equivocal by IHC, negative by FISH (ratio 1.14), ER+ 95%, PR+ 90%, Ki67 5%.   07/18/2020 Cancer Staging   Staging form: Breast, AJCC 8th Edition - Clinical stage from 07/18/2020: Stage IA (cT1b, cN0, cM0, G2, ER+, PR+, HER2-) - Signed by Serena Croissant, MD on 07/18/2020 Stage prefix: Initial diagnosis Method of lymph node assessment: Clinical Histologic grading system: 3 grade system   07/24/2020 Genetic Testing   Negative genetic testing:  No pathogenic variants detected on the [facility] BRCAplus panel and CancerNext-Expanded + RNAinsight panel. The report dates are 07/24/2020 and 07/31/2020 respectively.   The BRCAplus panel offered by [employer] and includes sequencing and deletion/duplication analysis for the following 8 genes: ATM,  BRCA1, BRCA2, CDH1, CHEK2, PALB2, PTEN, and TP53. The CancerNext-Expanded + RNAinsight gene panel offered by [employer] and includes sequencing and rearrangement analysis for the following 77 genes: AIP, ALK, APC, ATM, AXIN2, BAP1, BARD1, BLM, BMPR1A, BRCA1, BRCA2, BRIP1, CDC73, CDH1, CDK4, CDKN1B, CDKN2A, CHEK2, CTNNA1, DICER1, FANCC, FH, FLCN, GALNT12, KIF1B, LZTR1, MAX, MEN1, MET, MLH1, MSH2, MSH3, MSH6, MUTYH, NBN, NF1, NF2, NTHL1, PALB2, PHOX2B, PMS2, POT1, PRKAR1A, PTCH1, PTEN, RAD51C, RAD51D, RB1, RECQL, RET, SDHA, SDHAF2, SDHB, SDHC, SDHD, SMAD4, SMARCA4, SMARCB1, SMARCE1, STK11, SUFU, TMEM127, TP53, TSC1, TSC2, VHL and XRCC2 (sequencing and deletion/duplication); EGFR, EGLN1, HOXB13, KIT, MITF, PDGFRA, POLD1 and POLE (sequencing only); EPCAM and GREM1 (deletion/duplication only). RNA data is routinely analyzed for use in variant interpretation for all genes.     CHIEF COMPLIANT: Follow-up of left breast cancer  INTERVAL HISTORY: Rebecca Morrison is a 50 y.o. with above-mentioned history of left breast cancer.  She underwent bilateral mastectomies with reconstruction at [facility].  She presents to the clinic today for follow-up to discuss the pathology reports and treatment plan.  She is healing and recovering very well from recent surgery.  She still has a lot of pain and discomfort for which she is taking ibuprofen.  ALLERGIES:  has No Known Allergies.  MEDICATIONS:  Current Outpatient Medications  Medication Sig Dispense Refill   ALPRAZolam (XANAX) 0.25 MG tablet Take 1 tablet (0.25 mg total) by mouth 3 (three) times daily as needed for anxiety. 30 tablet 0   amphetamine-dextroamphetamine (ADDERALL XR) 10 MG 24 hr capsule Take 1 capsule (10 mg total) by mouth daily. 30 capsule 0   amphetamine-dextroamphetamine (ADDERALL XR) 20 MG 24 hr capsule Take 1 capsule (20 mg  total) by mouth daily. 30 capsule 0   amphetamine-dextroamphetamine (ADDERALL XR) 20 MG 24 hr capsule Take 1 capsule (20  mg total) by mouth daily. 30 capsule 0   amphetamine-dextroamphetamine (ADDERALL XR) 20 MG 24 hr capsule Take 1 capsule (20 mg total) by mouth daily. 30 capsule 0   famotidine (PEPCID) 20 MG tablet Take 1 tablet by mouth daily as needed. (Patient not taking: Reported on 08/22/2020)     FLUoxetine (PROZAC) 10 MG capsule TAKE 1 CAPSULE BY MOUTH EVERY DAY 90 capsule 0   Glucosamine-Chondroit-Vit C-Mn (GLUCOSAMINE CHONDR 500 COMPLEX PO) Take 1 tablet by mouth as needed.     omeprazole (PRILOSEC) 40 MG capsule Take 1 capsule (40 mg total) by mouth daily. 90 capsule 3   traZODone (DESYREL) 50 MG tablet TAKE 1 TABLET BY MOUTH EVERYDAY AT BEDTIME 90 tablet 1   No current facility-administered medications for this visit.    PHYSICAL EXAMINATION: ECOG PERFORMANCE STATUS: 1 - Symptomatic but completely ambulatory  Vitals:   10/30/20 1451  BP: 132/81  Pulse: 73  Resp: 18  Temp: (!) 97.5 F (36.4 C)  SpO2: 99%   Filed Weights   10/30/20 1451  Weight: 182 lb 8 oz (82.8 kg)   LABORATORY DATA:  I have reviewed the data as listed CMP Latest Ref Rng & Units 07/18/2020 11/29/2019 01/10/2019  Glucose 70 - 99 mg/dL 104(H) 78 98  BUN 6 - 20 mg/dL $Remove'9 14 10  'wTIgvXT$ Creatinine 0.44 - 1.00 mg/dL 0.82 0.78 0.80  Sodium 135 - 145 mmol/L 139 140 138  Potassium 3.5 - 5.1 mmol/L 4.3 4.2 4.3  Chloride 98 - 111 mmol/L 103 104 103  CO2 22 - 32 mmol/L $RemoveB'28 25 26  'SgRmYkod$ Calcium 8.9 - 10.3 mg/dL 9.7 9.9 9.8  Total Protein 6.5 - 8.1 g/dL 7.4 7.1 -  Total Bilirubin 0.3 - 1.2 mg/dL 0.6 0.5 -  Alkaline Phos 38 - 126 U/L 65 - -  AST 15 - 41 U/L 24 18 -  ALT 0 - 44 U/L 25 20 -    Lab Results  Component Value Date   WBC 4.0 07/18/2020   HGB 12.2 07/18/2020   HCT 36.9 07/18/2020   MCV 90.0 07/18/2020   PLT 159 07/18/2020   NEUTROABS 2.6 07/18/2020    ASSESSMENT & PLAN:  Malignant neoplasm of lower-outer quadrant of left breast of female, estrogen receptor positive ([place]) 07/10/2020:Patient palpated a left breast lump at the  8 o'clock position above a scar from previous breast reduction surgery. Diagnostic mammogram and US showed a 0.8cm left breast mass at the 8 o'clock position and no left axillary adenopathy. Biopsy showed IDC, grade 1/2, HER-2 equivocal by IHC, negative by FISH (ratio 1.14), ER+ 95%, PR+ 90%, Ki67 5%.  Recommendations: 1.  Bilateral mastectomies: [facility]: Dr. Arletta Bale on 09/20/2020.  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 Tumor measured 9 mm. This was associated with DCIS, intermediate grade. Margins were negative for both in situ invasive disease. ER 100%, PR 50% and HER2/neu was 1+  negative.   Right mastectomy: ADH, intraductal papilloma: Benign  2. Oncotype DX testing : Recurrence score: 18, risk of distant recurrence: 5% at 9 years, no role of chemotherapy 3. Adjuvant antiestrogen therapy with tamoxifen 20 mg daily until June 2023.  If she does not have menstrual cycles by then treat can be considered to be in menopause and then we can switch her to anastrozole or letrozole.  I recommended that she start tamoxifen at 10  mg daily for 2 weeks and then increase to 20 mg daily. She saw Dr. Christianne Dolin at [facility]  But she wishes to follow-up with [place] instead.  Breast pain: Currently on ibuprofen.  I discussed with her about cutting down the ibuprofen and substituting it with some Tylenol.  Telephone visit in 2 weeks to discuss tolerance to tamoxifen Return to clinic in 3 months for survivorship care plan visit     No orders of the defined types were placed in this encounter.  The patient has a good understanding of the overall plan. she agrees with it. she will call with any problems that may develop before the next visit here.  Total time spent: 20 mins including face to face time and time spent for planning, charting and coordination of care  Rulon Eisenmenger, MD, MPH 10/30/2020  I, Thana Ates, am acting as scribe for Dr. [name].  I have reviewed the above  documentation for accuracy and completeness, and I agree with the above.

## 2020-10-30 ENCOUNTER — Other Ambulatory Visit: Payer: Self-pay

## 2020-10-30 ENCOUNTER — Inpatient Hospital Stay: Payer: 59 | Attending: Hematology and Oncology | Admitting: Hematology and Oncology

## 2020-10-30 ENCOUNTER — Encounter: Payer: Self-pay | Admitting: Oncology

## 2020-10-30 DIAGNOSIS — C50512 Malignant neoplasm of lower-outer quadrant of left female breast: Secondary | ICD-10-CM | POA: Diagnosis present

## 2020-10-30 DIAGNOSIS — Z9013 Acquired absence of bilateral breasts and nipples: Secondary | ICD-10-CM | POA: Insufficient documentation

## 2020-10-30 DIAGNOSIS — Z79810 Long term (current) use of selective estrogen receptor modulators (SERMs): Secondary | ICD-10-CM | POA: Insufficient documentation

## 2020-10-30 DIAGNOSIS — Z79899 Other long term (current) drug therapy: Secondary | ICD-10-CM | POA: Diagnosis not present

## 2020-10-30 DIAGNOSIS — Z170 Estrogen receptor positive status [ER+]: Secondary | ICD-10-CM

## 2020-10-30 MED ORDER — VENLAFAXINE HCL ER 37.5 MG PO CP24: 37.5000 mg | ORAL_CAPSULE | Freq: Every day | ORAL | 6 refills | Status: DC

## 2020-10-30 MED ORDER — TAMOXIFEN CITRATE 20 MG PO TABS: 20.0000 mg | ORAL_TABLET | Freq: Every day | ORAL | 3 refills | Status: DC

## 2020-10-30 NOTE — Assessment & Plan Note (Signed)
07/10/2020:Patient palpated a left breast lump at the 8 o'clock position above a scar from previous breast reduction surgery. Diagnostic mammogram and US showed a 0.8cm left breast mass at the 8 o'clock position and no left axillary adenopathy. Biopsy showed IDC, grade 1/2, HER-2 equivocal by IHC, negative by FISH (ratio 1.14), ER+ 95%, PR+ 90%, Ki67 5%.  Recommendations: 1.  Bilateral mastectomies: [facility]: Dr. Arletta Bale on 09/20/2020.  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 Tumor measured 9 mm. This was associated with DCIS, intermediate grade. Margins were negative for both in situ invasive disease. ER 100%, PR 50% and HER2/neu was 1+  negative.   Right mastectomy: ADH, intraductal papilloma: Benign  2. Oncotype DX testing : Based on the fact that the tumor is grade 2, I recommended Oncotype testing 3. Adjuvant antiestrogen therapy with tamoxifen 20 mg daily x 10 years versus aromatase elevated therapy after menopause  She saw Dr. Christianne Dolin at [facility].

## 2020-10-31 DIAGNOSIS — Z780 Asymptomatic menopausal state: Secondary | ICD-10-CM | POA: Insufficient documentation

## 2020-11-13 NOTE — Progress Notes (Signed)
Called patient multiple times but could not get a hold of her.  I left a message. This encounter was created in error - please disregard.

## 2020-11-13 NOTE — Assessment & Plan Note (Signed)
07/10/2020:Patient palpated a left breast lump at the 8 o'clock position above a scar from previous breast reduction surgery. Diagnostic mammogram and US showed a 0.8cm left breast mass at the 8 o'clock position and no left axillary adenopathy. Biopsy showed IDC, grade 1/2, HER-2 equivocal by IHC, negative by FISH (ratio 1.14), ER+ 95%, PR+ 90%, Ki67 5%.  Recommendations: 1.  Bilateral mastectomies: [facility]: Dr. Arletta Bale on 09/20/2020.  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 Tumor measured 9 mm. This was associated with DCIS, intermediate grade. Margins were negative for both in situ invasive disease. ER 100%, PR 50% and HER2/neu was 1+  negative.  Right mastectomy: ADH, intraductal papilloma: Benign  2. Oncotype DX testing: Recurrence score: 18, risk of distant recurrence: 5% at 9 years, no role of chemotherapy 3. Adjuvant antiestrogen therapywith tamoxifen 20 mg daily until June 2023.  If she does not have menstrual cycles by then treat can be considered to be in menopause and then we can switch her to anastrozole or letrozole.  I recommended that she start tamoxifen at 10 mg daily for 2 weeks and then increase to 20 mg daily. She saw Dr. Christianne Dolin at [facility].  But she wishes to follow-up with [place] instead.  Breast pain: Currently on ibuprofen.  I discussed with her about cutting down the ibuprofen and substituting it with some Tylenol.  Telephone visit in 2 weeks to discuss tolerance to tamoxifen Return to clinic for survivorship care plan

## 2020-11-14 ENCOUNTER — Inpatient Hospital Stay: Payer: 59 | Admitting: Hematology and Oncology

## 2020-11-14 DIAGNOSIS — C50512 Malignant neoplasm of lower-outer quadrant of left female breast: Secondary | ICD-10-CM

## 2020-11-14 DIAGNOSIS — Z170 Estrogen receptor positive status [ER+]: Secondary | ICD-10-CM

## 2020-11-16 ENCOUNTER — Other Ambulatory Visit: Payer: Self-pay

## 2020-11-16 ENCOUNTER — Ambulatory Visit (INDEPENDENT_AMBULATORY_CARE_PROVIDER_SITE_OTHER): Payer: 59 | Admitting: Mental Health

## 2020-11-16 DIAGNOSIS — F411 Generalized anxiety disorder: Secondary | ICD-10-CM | POA: Diagnosis not present

## 2020-11-16 NOTE — Progress Notes (Signed)
[facility] Psychotherapy note  Name: Rebecca Morrison Date: 11/16/20 MRN: [id number] DOB: [birth year] PCP: Howard Pouch A, DO  Time spent: 57 minutes  Treatment: individual therapy     Mental Status Exam:    Appearance:   Casual     Behavior:  Appropriate  Motor:  Normal  Speech/Language:   Clear and Coherent  Affect:  Full Range  Mood:  Anxious, pleasant  Thought process:  normal  Thought content:    WNL  Sensory/Perceptual disturbances:    WNL  Orientation:  x4  Attention:  Good  Concentration:  Good  Memory:  WNL  Fund of knowledge:   Good  Insight:    Good  Judgment:   Good  Impulse Control:  developing   Reported Symptoms: Sad and anxious feelings, impulsivity, irritability, rumination, sleep disturbance, distractible, probs w/ focus/attention  Risk Assessment: Danger to Self:  No Self-injurious Behavior: No Danger to Others: No Duty to Warn:no Physical Aggression / Violence:No  Access to Firearms a concern: No  Gang Involvement:No  Patient / guardian was educated about steps to take if suicide or homicide risk level increases between visits: yes While future psychiatric events cannot be accurately predicted, the patient does not currently require acute inpatient psychiatric care and does not currently meet [name] involuntary commitment criteria.  Subjective:  Patient presents for today's session.  She shared events since last visit which was a few months ago.  She shared how she was diagnosed with breast cancer and has received treatment and has undergone some surgery with some continued procedures forthcoming over the next few months.  She went on to share family stressors, how her son was expelled from his school.  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  She stated that she has decided to have her sons attend  another high school locally.  She is concerned about one of her son's use of pornography and we discussed some potential resources for therapy as she was inquiring about getting him some treatment.  She identified the need to work through this adjustment as she is also had to consult an attorney due to her son's behaviors that led to his being expelled.  She plans to seek some part-time employment over the next few weeks as well as she and her husband continue to disagree over finances and have other relationship challenges at times.   Interventions:  CBT, supportive therapy, problem solving  Diagnoses:  No diagnosis found.  r/o ADHD  Plan: Patient is to use CBT, mindfulness and coping skills to help manage decrease symptoms.  Patient to continue to utilize her support system and attend her doctor appointments as a way to cope and care for herself.    Long-term goal:   Reduce overall level, frequency, and intensity of the feelings of depression and anxiety up to at least 80% of the time in severity for at least 3 consecutive months as reported by patient   Short-term goal:  Start working [employer] and task completion consistency Improved communication skills to improve her relationships   Assessment of progress:  progressing   [place], [facility]

## 2020-11-28 ENCOUNTER — Other Ambulatory Visit: Payer: Self-pay | Admitting: Adult Health

## 2020-11-28 DIAGNOSIS — F411 Generalized anxiety disorder: Secondary | ICD-10-CM

## 2020-11-28 DIAGNOSIS — F331 Major depressive disorder, recurrent, moderate: Secondary | ICD-10-CM

## 2020-11-28 DIAGNOSIS — F428 Other obsessive-compulsive disorder: Secondary | ICD-10-CM

## 2020-12-02 ENCOUNTER — Other Ambulatory Visit: Payer: Self-pay | Admitting: Adult Health

## 2020-12-02 DIAGNOSIS — G4700 Insomnia, unspecified: Secondary | ICD-10-CM

## 2020-12-03 NOTE — Telephone Encounter (Signed)
Please schedule appt

## 2020-12-04 NOTE — Telephone Encounter (Signed)
Call pt and LVM

## 2020-12-07 ENCOUNTER — Ambulatory Visit (INDEPENDENT_AMBULATORY_CARE_PROVIDER_SITE_OTHER): Payer: 59 | Admitting: Mental Health

## 2020-12-07 ENCOUNTER — Ambulatory Visit: Payer: 59 | Admitting: Mental Health

## 2020-12-07 ENCOUNTER — Other Ambulatory Visit: Payer: Self-pay

## 2020-12-07 DIAGNOSIS — F411 Generalized anxiety disorder: Secondary | ICD-10-CM | POA: Diagnosis not present

## 2020-12-10 NOTE — Progress Notes (Signed)
[facility] note  Name: Rebecca Morrison Date: 12/07/20 MRN: [id number] DOB: October 20, 1970 PCP: Howard Pouch A, DO  Time spent: 45 minutes  Treatment: individual therapy  Mental Status Exam:    Appearance:   Casual     Behavior:  Appropriate  Motor:  Normal  Speech/Language:   Clear and Coherent  Affect:  Full Range  Mood:  Anxious, pleasant  Thought process:  normal  Thought content:    WNL  Sensory/Perceptual disturbances:    WNL  Orientation:  x4  Attention:  Good  Concentration:  Good  Memory:  WNL  Fund of knowledge:   Good  Insight:    Good  Judgment:   Good  Impulse Control:  developing   Reported Symptoms: Sad and anxious feelings, impulsivity, irritability, rumination, sleep disturbance, distractible, probs w/ focus/attention  Risk Assessment: Danger to Self:  No Self-injurious Behavior: No Danger to Others: No Duty to Warn:no Physical Aggression / Violence:No  Access to Firearms a concern: No  Gang Involvement:No  Patient / guardian was educated about steps to take if suicide or homicide risk level increases between visits: yes While future psychiatric events cannot be accurately predicted, the patient does not currently require acute inpatient psychiatric care and does not currently meet [facility] involuntary commitment criteria.  Subjective:  Patient presents for today's session.  Patient shared how she continues to have family relationship stress, going on to share More details related to family dynamics. She stated that she is relationally closer to her older son as her younger son of the two often aligns with his father. She said she is not often "feel heard". Utilizing motivational interviewing, patient identified thoughts and insights related to wanting the communication in the home to change. She stated that she and her husband have strain in their relationship as a result. Facilitated her identifying needs, where she plans to  continue to express changes needed to go forward in their relationship.    Interventions:  CBT, supportive therapy, problem solving  Diagnoses:    ICD-10-CM   1. Generalized anxiety disorder  F41.1       r/o ADHD  Plan: Patient is to use CBT, mindfulness and coping skills to help manage decrease symptoms.  Patient to continue to utilize her support system and attend her doctor appointments as a way to cope and care for herself.    Long-term goal:   Reduce overall level, frequency, and intensity of the feelings of depression and anxiety up to at least 80% of the time in severity for at least 3 consecutive months as reported by patient   Short-term goal:  Start working [employer] and task completion consistency Improved communication skills to improve her relationships   Assessment of progress:  progressing   Anson [place], [facility]

## 2020-12-17 ENCOUNTER — Ambulatory Visit: Payer: 59 | Admitting: Adult Health

## 2020-12-17 ENCOUNTER — Ambulatory Visit: Payer: 59 | Admitting: Mental Health

## 2021-01-04 DIAGNOSIS — Z5181 Encounter for therapeutic drug level monitoring: Secondary | ICD-10-CM | POA: Insufficient documentation

## 2021-01-04 DIAGNOSIS — E2839 Other primary ovarian failure: Secondary | ICD-10-CM | POA: Insufficient documentation

## 2021-02-07 ENCOUNTER — Telehealth: Payer: Self-pay | Admitting: *Deleted

## 2021-02-07 ENCOUNTER — Inpatient Hospital Stay: Payer: 59 | Attending: Hematology and Oncology | Admitting: Adult Health

## 2021-02-24 HISTORY — PX: ROBOTIC ASSISTED TOTAL HYSTERECTOMY: SHX6085

## 2021-02-28 ENCOUNTER — Telehealth: Payer: Self-pay

## 2021-02-28 NOTE — Telephone Encounter (Signed)
Patient refill request.   [facility]  omeprazole (PRILOSEC) 40 MG capsule [[id number]]

## 2021-02-28 NOTE — Telephone Encounter (Signed)
In case pt calls after I have left for the day

## 2021-02-28 NOTE — Telephone Encounter (Signed)
Pt needs to call pharmacy for refill. Rx sent for 1 year no refill needed until 08/22/21. If having complications getting refilled, have pharmacy call or send request.    omeprazole (PRILOSEC) 40 MG capsule [[id number]]    Order Details Dose: 40 mg Route: Oral Frequency: Daily  Dispense Quantity: 90 capsule Refills: 3        Sig: Take 1 capsule (40 mg total) by mouth daily.       Start Date: 08/22/20 End Date: --  Written Date: 08/22/20 Expiration Date: 08/22/21  Providers  Authorizing Provider:   [name], DO  [address] [place], [place] [address]  Phone:  [phone]   Fax:  [phone]  DEA #:  [id number]   NPI:  [id number]      Ordering User:  [name], DO       Pharmacy  [id number] [place], [place], [place] [address]  Phone:  [phone]  Fax:  [phone]

## 2021-04-01 ENCOUNTER — Telehealth: Payer: Self-pay | Admitting: *Deleted

## 2021-04-01 ENCOUNTER — Encounter: Payer: Self-pay | Admitting: Nurse Practitioner

## 2021-04-01 ENCOUNTER — Ambulatory Visit (INDEPENDENT_AMBULATORY_CARE_PROVIDER_SITE_OTHER): Payer: 59 | Admitting: Nurse Practitioner

## 2021-04-01 ENCOUNTER — Other Ambulatory Visit: Payer: Self-pay

## 2021-04-01 VITALS — BP 146/94

## 2021-04-01 DIAGNOSIS — N8110 Cystocele, unspecified: Secondary | ICD-10-CM | POA: Diagnosis not present

## 2021-04-01 DIAGNOSIS — N8189 Other female genital prolapse: Secondary | ICD-10-CM

## 2021-04-01 DIAGNOSIS — R102 Pelvic and perineal pain: Secondary | ICD-10-CM

## 2021-04-01 DIAGNOSIS — N3946 Mixed incontinence: Secondary | ICD-10-CM | POA: Diagnosis not present

## 2021-04-01 NOTE — Progress Notes (Signed)
° °  Acute Office Visit  Subjective:    Patient ID: Rebecca Morrison, female    DOB: 1970/07/24, 51 y.o.   MRN: [id number]   HPI 51 y.o. presents today for vaginal pressure. This is not new for her and she has "felt different" since having her vaginal deliveries about 14 years ago. She describes the difference as a pressure. She went on a trip recently and had lots of pressure near vaginal opening after extensive walking. She felt a bulge at vaginal opening and had her husband look who said he saw something there. She complains of stress and urge incontinence x 10 years that has worsened. No pain. No other urinary or vaginal symptoms. She did have intercourse recently and thinks it was pushed back in.    Review of Systems  Constitutional: Negative.   Genitourinary:  Positive for urgency. Negative for difficulty urinating, dyspareunia, dysuria, flank pain, frequency, hematuria, pelvic pain, vaginal bleeding, vaginal discharge and vaginal pain.       Pressure at vaginal opening at times, deeper pressure with sitting      Objective:    Physical Exam Constitutional:      Appearance: Normal appearance.  Genitourinary:    General: Normal vulva.     Vagina: No vaginal discharge or erythema.     Cervix: Normal.     Uterus: Normal.      Comments: No evidence of uterine prolapse today. Positive for vaginal wall prolapse and pelvic floor weakness   BP (!) 146/94  Wt Readings from Last 3 Encounters:  10/30/20 182 lb 8 oz (82.8 kg)  08/22/20 173 lb (78.5 kg)  07/18/20 172 lb 11.2 oz (78.3 kg)        Assessment & Plan:   Problem List Items Addressed This Visit   None Visit Diagnoses     Mixed incontinence urge and stress    -  Primary   Pelvic pressure in female       Vaginal wall prolapse       Pelvic floor weakness          Plan: Will send referral to Urogynecology for further evaluation of possible prolapse and mixed urinary incontinence. We did briefly discuss possible management  options. She is agreeable to plan.      Tamela Gammon DNP, 11:28 AM 04/01/2021

## 2021-04-01 NOTE — Telephone Encounter (Signed)
-----   Message from Tamela Gammon, NP sent at 04/01/2021 11:26 AM EST ----- Regarding: Urogyn referral Please send referral to urogynecology for evaluation and management of mixed urinary incontinence, vaginal wall prolapse, pelvic floor weakness, and pelvic pressure.

## 2021-04-01 NOTE — Telephone Encounter (Signed)
Referral placed at Urogynecology office they will call to schedule.

## 2021-04-10 NOTE — Telephone Encounter (Signed)
Urogy left message for patient to call x1.    I also asked patient to call me regarding referral to provider with # to call office to schedule. [phone]

## 2021-04-16 NOTE — Telephone Encounter (Signed)
Patient scheduled on 07/09/21 with Jaquita Folds, MD

## 2021-04-23 ENCOUNTER — Other Ambulatory Visit: Payer: Self-pay | Admitting: Nurse Practitioner

## 2021-04-23 DIAGNOSIS — R102 Pelvic and perineal pain: Secondary | ICD-10-CM

## 2021-04-25 ENCOUNTER — Encounter: Payer: Self-pay | Admitting: Obstetrics & Gynecology

## 2021-04-25 ENCOUNTER — Ambulatory Visit (INDEPENDENT_AMBULATORY_CARE_PROVIDER_SITE_OTHER): Payer: 59 | Admitting: Obstetrics & Gynecology

## 2021-04-25 ENCOUNTER — Other Ambulatory Visit: Payer: Self-pay | Admitting: Nurse Practitioner

## 2021-04-25 ENCOUNTER — Other Ambulatory Visit: Payer: Self-pay

## 2021-04-25 ENCOUNTER — Ambulatory Visit (INDEPENDENT_AMBULATORY_CARE_PROVIDER_SITE_OTHER): Payer: 59

## 2021-04-25 VITALS — BP 110/68 | Temp 98.7°F

## 2021-04-25 DIAGNOSIS — R350 Frequency of micturition: Secondary | ICD-10-CM

## 2021-04-25 DIAGNOSIS — N8189 Other female genital prolapse: Secondary | ICD-10-CM

## 2021-04-25 DIAGNOSIS — R102 Pelvic and perineal pain: Secondary | ICD-10-CM

## 2021-04-25 DIAGNOSIS — N3946 Mixed incontinence: Secondary | ICD-10-CM | POA: Diagnosis not present

## 2021-04-25 LAB — URINALYSIS, COMPLETE W/RFL CULTURE
Bacteria, UA: NONE SEEN /HPF
Bilirubin Urine: NEGATIVE
Glucose, UA: NEGATIVE
Hgb urine dipstick: NEGATIVE
Hyaline Cast: NONE SEEN /LPF
Ketones, ur: NEGATIVE
Leukocyte Esterase: NEGATIVE
Nitrites, Initial: NEGATIVE
Protein, ur: NEGATIVE
RBC / HPF: NONE SEEN /HPF (ref 0–2)
Specific Gravity, Urine: 1.015 (ref 1.001–1.035)
WBC, UA: NONE SEEN /HPF (ref 0–5)
pH: 7 (ref 5.0–8.0)

## 2021-04-25 LAB — NO CULTURE INDICATED

## 2021-04-25 NOTE — Progress Notes (Signed)
? ? ?  Rebecca Morrison [birth year] [id number] ? ? ?     51 y.o.  G2P2L2 ? ?RP: Pelvic Pressure/Discomfort for Pelvic US ? ?HPI: C/O pelvic pressure and discomfort.  Referred to UroGyn for mixed Urinary Incontinence and pelvic floor weakness with visit scheduled 07/09/21 with Sherlene Shams.  Breast Ca post bilateral Mastectomy. ? ? ?OB History  ?Gravida Para Term Preterm AB Living  ?2 2       2   ?SAB IAB Ectopic Multiple Live Births  ?           ?  ?# Outcome Date GA Lbr Len/2nd Weight Sex Delivery Anes PTL Lv  ?2 Para           ?1 Para           ? ? ?Past medical history,surgical history, problem list, medications, allergies, family history and social history were all reviewed and documented in the EPIC chart. ? ? ?Directed ROS with pertinent positives and negatives documented in the history of present illness/assessment and plan. ? ?Exam: ? ?Vitals:  ? 04/25/21 1112  ?BP: 110/68  ?Temp: 98.7 ?F (37.1 ?C)  ?TempSrc: Oral  ? ?General appearance:  Normal ? ?Gynecologic exam:  Vulva normal.  Bimanual exam laying down with Valsalva:  AV uterus, normal volume, mobile, NT.  Mild Cystocele.  No uterine prolapse or rectocele.  Vaginal exam standing up with Valsalva:  Mild Cystocele grade 1/4. ? ?Pelvic US today:  ?T/A images.  Limited transabdominal ultrasound to look at the urinary bladder.  Debris seen in the posterior aspect of the urinary bladder with bladder wall thickening.  Bilateral ureteral jets seen, no evidence of upper urinary tract obstruction.   ?T/V images.  Anteverted uterus normal in size and shape with an anterior subserosal fibroid noted measuring 2.8 x 3 cm.  Posterior intramural fibroid noted measuring 1.3 x 1.5 cm.  The overall uterine size is measured at 7.04 x 4.17 x 3.51 cm.  Symmetrical endometrium measuring approximately 5.1 mm.  No cavitary distortion seen.  No mass or thickening seen.  Both ovaries are atrophic in appearance with a single follicle seen on the right ovary.  No adnexal mass seen.  No  free fluid in the pelvis. ? ?U/A: Completely Negative ? ? ?Assessment/Plan:  51 y.o. G2P2L2  ? ?1. Pelvic pressure in female ?C/O pelvic pressure and discomfort.  Pelvic US findings reviewed.  2 small Uterine Fibroids, 1 SS and 1 IM.  Overall uterine size normal.  Normal endometrial line.  Normal bilateral ovaries.  Reassured. ? ?2. Urinary frequency ?U/A Neg.  Reassured. ?- Urinalysis,Complete w/RFL Culture ? ?3. Pelvic floor weakness ?SUI with grade 1/4 Cystocele. ? ?4. Mixed incontinence urge and stress ?Urodynamic testing and management with Dr Sherlene Shams Uro-Gynecologist. ? ?Other orders ?- B Complex Vitamins (B-COMPLEX/B-12 PO); Take by mouth. ?- BORON PO; Take by mouth. ?- CALCIUM PO; Take by mouth. ?- Omega-3 Fatty Acids (FISH OIL PO); Take by mouth.  ? ?Princess Bruins MD, 11:21 AM 04/25/2021 ? ? ? ?  ?

## 2021-04-30 ENCOUNTER — Encounter: Payer: Self-pay | Admitting: Obstetrics & Gynecology

## 2021-05-14 ENCOUNTER — Encounter: Payer: Self-pay | Admitting: Nurse Practitioner

## 2021-05-21 NOTE — Telephone Encounter (Signed)
Please call patient to get her scheduled for annual and surgical consult with Dr. Dellis Filbert. Thanks.  ?

## 2021-05-26 ENCOUNTER — Other Ambulatory Visit: Payer: Self-pay | Admitting: Adult Health

## 2021-05-26 DIAGNOSIS — G4700 Insomnia, unspecified: Secondary | ICD-10-CM

## 2021-05-29 ENCOUNTER — Ambulatory Visit (INDEPENDENT_AMBULATORY_CARE_PROVIDER_SITE_OTHER): Payer: 59 | Admitting: Obstetrics & Gynecology

## 2021-05-29 ENCOUNTER — Other Ambulatory Visit (HOSPITAL_COMMUNITY)
Admission: RE | Admit: 2021-05-29 | Discharge: 2021-05-29 | Disposition: A | Discharge status: Home / Self Care | Payer: 59 | Source: Ambulatory Visit | Attending: Obstetrics & Gynecology | Admitting: Obstetrics & Gynecology

## 2021-05-29 ENCOUNTER — Encounter: Payer: Self-pay | Admitting: Obstetrics & Gynecology

## 2021-05-29 VITALS — BP 140/88 | HR 96 | Resp 16 | Ht 61.25 in | Wt 190.0 lb

## 2021-05-29 DIAGNOSIS — C50912 Malignant neoplasm of unspecified site of left female breast: Secondary | ICD-10-CM | POA: Diagnosis not present

## 2021-05-29 DIAGNOSIS — N3946 Mixed incontinence: Secondary | ICD-10-CM | POA: Diagnosis not present

## 2021-05-29 DIAGNOSIS — Z01419 Encounter for gynecological examination (general) (routine) without abnormal findings: Secondary | ICD-10-CM | POA: Insufficient documentation

## 2021-05-29 DIAGNOSIS — Z6835 Body mass index (BMI) 35.0-35.9, adult: Secondary | ICD-10-CM | POA: Diagnosis not present

## 2021-05-29 NOTE — Progress Notes (Signed)
? ? ?Rebecca Morrison [birth year] [id number] ? ? ?History:    51 y.o. G2P2L2  Married ?  ?RP:  Established patient presenting for annual gyn exam  ? ?HPI:  Amenorrhea on Aromasin.  No BTB.  Has some hot flushes.  No pain with IC. Pap Neg 02/2018.  Pap reflex today.  C/O intermittent pelvic pressure and discomfort.  Referred to UroGyn for mixed Urinary Incontinence and pelvic floor weakness with visit scheduled 07/09/21 with Sherlene Shams.  Breast Ca post bilateral Mastectomy 08/2020 and reconstruction.  On Aromasin.  BMI 35.61  Health labs with Fam MD.  BMD: 2022, COLONOSCOPY: 03-17-18. ? ?Past medical history,surgical history, family history and social history were all reviewed and documented in the EPIC chart. ? ?Gynecologic History ?No LMP recorded. (Menstrual status: Other). ? ?Obstetric History ?OB History  ?Gravida Para Term Preterm AB Living  ?'2 2       2  '$ ?SAB IAB Ectopic Multiple Live Births  ?           ?  ?# Outcome Date GA Lbr Len/2nd Weight Sex Delivery Anes PTL Lv  ?2 Para           ?1 Para           ? ? ? ?ROS: A ROS was performed and pertinent positives and negatives are included in the history. ? GENERAL: No fevers or chills. HEENT: No change in vision, no earache, sore throat or sinus congestion. NECK: No pain or stiffness. CARDIOVASCULAR: No chest pain or pressure. No palpitations. PULMONARY: No shortness of breath, cough or wheeze. GASTROINTESTINAL: No abdominal pain, nausea, vomiting or diarrhea, melena or bright red blood per rectum. GENITOURINARY: No urinary frequency, urgency, hesitancy or dysuria. MUSCULOSKELETAL: No joint or muscle pain, no back pain, no recent trauma. DERMATOLOGIC: No rash, no itching, no lesions. ENDOCRINE: No polyuria, polydipsia, no heat or cold intolerance. No recent change in weight. HEMATOLOGICAL: No anemia or easy bruising or bleeding. NEUROLOGIC: No headache, seizures, numbness, tingling or weakness. PSYCHIATRIC: No depression, no loss of interest in normal activity or  change in sleep pattern.  ?  ? ?Exam: ? ? ?BP (!) 140/94   Pulse 96   Resp 16   Ht 5' 1.25" (1.556 m)   Wt 190 lb (86.2 kg)   BMI 35.61 kg/m?  ? ?Body mass index is 35.61 kg/m?. ? ?General appearance : Well developed well nourished female. No acute distress ?HEENT: Eyes: no retinal hemorrhage or exudates,  Neck supple, trachea midline, no carotid bruits, no thyroidmegaly ?Lungs: Clear to auscultation, no rhonchi or wheezes, or rib retractions  ?Heart: Regular rate and rhythm, no murmurs or gallops ?Breast:Examined in sitting and supine position were symmetrical in appearance, no palpable masses or tenderness,  no skin retraction, no nipple inversion, no nipple discharge, no skin discoloration, no axillary or supraclavicular lymphadenopathy ?Abdomen: no palpable masses or tenderness, no rebound or guarding ?Extremities: no edema or skin discoloration or tenderness ? ?Pelvic: Vulva: Normal ?            Vagina: No gross lesions or discharge ? Cervix: No gross lesions or discharge.  Pap reflex done. ? Uterus  AV, normal size, shape and consistency, non-tender and mobile ? Adnexa  Without masses or tenderness ? Anus: Normal ? ? ?Assessment/Plan:  51 y.o. female for annual exam  ? ?1. Encounter for routine gynecological examination with Papanicolaou smear of cervix ?Amenorrhea on Aromasin.  No BTB.  Has some hot flushes.  No pain with IC.  Pap Neg 02/2018.  Pap reflex today.  C/O intermittent pelvic pressure and discomfort.  Referred to UroGyn for mixed Urinary Incontinence and pelvic floor weakness with visit scheduled 07/09/21 with Sherlene Shams.  Breast Ca post bilateral Mastectomy 08/2020 and reconstruction.  On Aromasin.  BMI 35.61  Health labs with Fam MD.  BMD: 2022, COLONOSCOPY: 03-17-18. ?- Cytology - PAP( [place]) ? ?2. Invasive ductal carcinoma of breast, left ([place]) ?Counseling on Bilateral Salpingo-Oophorectomy in the context of Breast Ca with ER positive.  Given that patient had a bilateral  Mastectomy and is on Aromasin, recommended to discuss the benefits of a Bilateral SO with her Oncologist.  Genetic screening would be helpful in making a decision.  Will f/u with me if decides to proceed with a LPS BSO/Peritoneal washings. ? ?3. Mixed incontinence urge and stress ?No Uterine prolapse, no Cystocele on Gyn exam today.  C/O intermittent pelvic pressure and discomfort.  Referred to UroGyn for mixed Urinary Incontinence and pelvic floor weakness with visit scheduled 07/09/21 with Sherlene Shams.   ? ?4. Class 2 severe obesity due to excess calories with serious comorbidity and body mass index (BMI) of 35.0 to 35.9 in adult [facility]) ?Lower calorie/carb diet.  Increase fitness activities.  Started on Phentermine. BP 140/94 today, repeat BP with Fam MD to assure normal BP on Phentermine. ? ?Other orders ?- gabapentin (NEURONTIN) 100 MG capsule; Take 100-300 mg by mouth at bedtime. (Patient not taking: Reported on 05/29/2021) ?- FLUoxetine (PROZAC) 20 MG capsule; Take 20 mg by mouth daily. ?- CRANBERRY PO; Take by mouth. ?- MAGNESIUM PO; Take by mouth. ?- UNABLE TO FIND; Med Name: liver enzyme ?- phentermine 37.5 MG capsule; Take 37.5 mg by mouth every morning. Takes 1/2  ? ?Surgical counseling with review of documentation for 20 minutes. ? ?Princess Bruins MD, 12:14 PM 05/29/2021 ? ?  ?

## 2021-05-30 ENCOUNTER — Ambulatory Visit: Payer: 59 | Admitting: Obstetrics & Gynecology

## 2021-05-30 ENCOUNTER — Encounter: Payer: Self-pay | Admitting: Obstetrics & Gynecology

## 2021-05-31 LAB — CYTOLOGY - PAP
Adequacy: ABSENT
Comment: NEGATIVE
Diagnosis: NEGATIVE
High risk HPV: NEGATIVE

## 2021-06-03 ENCOUNTER — Encounter: Payer: Self-pay | Admitting: *Deleted

## 2021-06-11 ENCOUNTER — Telehealth: Payer: Self-pay

## 2021-06-11 NOTE — Telephone Encounter (Signed)
LVM for pt to CB regarding upcoming appt. Pt is sched for HTN and tachycardia.  ? ? ?Note: ?Need to know if pt is experiencing any sx. What are the readings for BP and pulse. Please route to a triage nurse or ED if pt is having any of the following sx:  ?Chest pain or discomfort; Shortness of breath; neck, back, arm, or shoulder pain; Feeling nauseous, light-headed, or fatigue.  ? ? ?Routine to PCP as an FYI ?

## 2021-06-12 ENCOUNTER — Telehealth: Payer: Self-pay | Admitting: Family Medicine

## 2021-06-12 ENCOUNTER — Encounter: Payer: Self-pay | Admitting: Family Medicine

## 2021-06-12 ENCOUNTER — Ambulatory Visit (INDEPENDENT_AMBULATORY_CARE_PROVIDER_SITE_OTHER): Payer: 59 | Admitting: Family Medicine

## 2021-06-12 VITALS — BP 132/88 | HR 74 | Temp 98.6°F | Ht 61.25 in | Wt 191.0 lb

## 2021-06-12 DIAGNOSIS — R030 Elevated blood-pressure reading, without diagnosis of hypertension: Secondary | ICD-10-CM | POA: Diagnosis not present

## 2021-06-12 DIAGNOSIS — Z9013 Acquired absence of bilateral breasts and nipples: Secondary | ICD-10-CM | POA: Diagnosis not present

## 2021-06-12 DIAGNOSIS — Z170 Estrogen receptor positive status [ER+]: Secondary | ICD-10-CM

## 2021-06-12 DIAGNOSIS — C50512 Malignant neoplasm of lower-outer quadrant of left female breast: Secondary | ICD-10-CM

## 2021-06-12 DIAGNOSIS — C50912 Malignant neoplasm of unspecified site of left female breast: Secondary | ICD-10-CM | POA: Diagnosis not present

## 2021-06-12 DIAGNOSIS — Z79818 Long term (current) use of other agents affecting estrogen receptors and estrogen levels: Secondary | ICD-10-CM

## 2021-06-12 DIAGNOSIS — I10 Essential (primary) hypertension: Secondary | ICD-10-CM

## 2021-06-12 DIAGNOSIS — Z79811 Long term (current) use of aromatase inhibitors: Secondary | ICD-10-CM

## 2021-06-12 LAB — TSH: TSH: 1.77 u[IU]/mL (ref 0.35–5.50)

## 2021-06-12 MED ORDER — AMLODIPINE BESYLATE 2.5 MG PO TABS: 2.5000 mg | ORAL_TABLET | Freq: Every day | ORAL | 1 refills | Status: DC

## 2021-06-12 NOTE — Telephone Encounter (Signed)
Spoke with pt and she stated yesterday she was feeling nauseous, lightheaded and fatigue. She was able to provide 2 BP readings; 4/5 140/88 with pulse 96 and 4/10 151/100 with pulse 76. She is unsure if medication is causing her BP to become elevated but oncologist recommended she discuss starting any beta blockers with PCP. Mainly feels fatigue going up and down stairs and walking her dogs.  ?

## 2021-06-12 NOTE — Progress Notes (Signed)
? ? ? ?This visit occurred during the SARS-CoV-2 public health emergency.  Safety protocols were in place, including screening questions prior to the visit, additional usage of staff PPE, and extensive cleaning of exam room while observing appropriate contact time as indicated for disinfecting solutions.  ? ? ?Rebecca Morrison , Apr 14, 1970, 51 y.o., female ?MRN: [id number] ?Patient Care Team  ?  Relationship Specialty Notifications Start End  ?[name], DO PCP - General Family Medicine  09/03/15   ?Esaw Dace, MD Referring Physician Gastroenterology  03/31/19   ?Darrin Luis, MD Referring Physician Plastic Surgery  03/31/19   ?Mauro Kaufmann, RN Oncology Nurse Navigator   07/13/20   ?Rockwell Germany, RN Oncology Nurse Navigator   07/13/20   ?Jovita Kussmaul, MD Consulting Physician General Surgery  07/16/20   ?[name], MD Consulting Physician Hematology and Oncology  07/16/20   ?[name], MD Consulting Physician Radiation Oncology  07/16/20   ?[place] Consulting Physician Oncology  05/22/21   ?Fannie Knee, MD Referring Physician Oncology  06/12/21   ?Princess Bruins, MD Consulting Physician Obstetrics and Gynecology  06/12/21   ? ? ?Chief Complaint  ?Patient presents with  ? Hypertension  ?  [place]; pt is not fasting  ? ?  ?Subjective: Pt presents for an OV with complaints of elevated BP readings and tachycardia since starting her Lupron injections and aromatase tx for invasive breast cancer .Associated symptoms include weight gain and stress ?Pt dx with invasive carcinoma of left breast, has undergone bilateral mastectomy/reconstruction and is receiving 3.75 mg Depot Lupron injections through [facility]. She is also prescribed aromatase. Her BP has been elevated since the start of the injections and aromatase. Reviewed last OV note at onc, she had a BP of 151/100.   ?Cbc, cmp reviewed in EMR from 02/2021> normal/stable for patient.  ? ? ?  06/12/2021  ?  8:40 AM  11/29/2019  ?  3:12 PM 03/31/2019  ?  8:38 AM 01/18/2018  ?  4:21 PM 01/18/2018  ?  3:43 PM  ?Depression screen PHQ 2/9  ?Decreased Interest 3 0 0 1 0  ?Down, Depressed, Hopeless 2 0 0 1 0  ?PHQ - 2 Score 5 0 0 2 0  ?Altered sleeping 2   3   ?Tired, decreased energy 3   3   ?Change in appetite 1   3   ?Feeling bad or failure about yourself  1   1   ?Trouble concentrating 1   1   ?Moving slowly or fidgety/restless 2   3   ?Suicidal thoughts 0   0   ?PHQ-9 Score 15   16   ?Difficult doing work/chores    Somewhat difficult   ? ? ?No Known Allergies ?Social History  ? ?Social History Narrative  ? Married, husband [name]. 2 children [name] in [place].  ? 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  ? Former smoker, quit 2003.  ? Social alcohol use, no drug use.  ? Drinks caffeinated beverages, uses herbal remedies, takes a daily vitamin.  ? Wears her seatbelt, wears a bicycle helmet, exercises at least 3 times a week.  ? Smoke detector in the home, feels safe in her relationships.  ? ?Past Medical History:  ?Diagnosis Date  ? Acute appendicitis 03/25/2020  ? Allergy   ? Anemia   ? pt states iron deficient  ? Back pain 03/05/2018  ? Cancer [facility])   ? left breast  invasive ductal  ? Chronic pain of both knees 01/10/2019  ? Cyst of right breast 06/19/2014  ? US/mamm, confirmed cyst 93m  ? Depression with anxiety 01/18/2018  ? Family history of breast cancer   ? Family history of colon cancer   ? Family history of kidney cancer   ? Family history of leukemia   ? Family history of ovarian cancer   ? Fatty liver   ? Gallbladder polyp   ? Gallstone   ? GERD (gastroesophageal reflux disease)   ? Hiatal hernia   ? 3/1//2016 Dr. LGala Lewandowsky ? Hip pain 01/12/2019  ? Internal hemorrhoids   ? Menorrhagia   ? Murmur   ? Patient states echocardiogram was normal  ? Reflux esophagitis   ? Tinnitus   ? ENT evaluated  ? ?Past Surgical History:  ?Procedure Laterality Date  ? AUGMENTATION MAMMAPLASTY  2022  ?  done after mastectomy  ? BREAST REDUCTION SURGERY Bilateral 02/2019  ? COLONOSCOPY  07/10/2011  ? rectal bleeding, FHX. Normal colonoscopy.   ? ESOPHAGOGASTRODUODENOSCOPY  03/27/2013  ? hiatal hernia/GERD  ? MASTECTOMY  08/2020  ? bilateral  ? ?Family History  ?Problem Relation Age of Onset  ? Arthritis Mother   ? Osteoporosis Mother   ?     "severe"  ? Kidney cancer Sister 378 ? Colon cancer Maternal Grandmother 537 ? Ovarian cancer Maternal Grandmother 569 ? Bone cancer Maternal Grandfather 510 ? Heart disease Maternal Grandfather   ? Breast cancer Paternal Grandmother   ?     dx >50  ? Rheum arthritis Maternal Great-grandmother   ? Leukemia Maternal Great-grandfather 549 ? ?Allergies as of 06/12/2021   ?No Known Allergies ?  ? ?  ?Medication List  ?  ? ?  ? Accurate as of June 12, 2021  9:11 AM. If you have any questions, ask your nurse or doctor.  ?  ?  ? ?  ? ?STOP taking these medications   ? ?ALPRAZolam 0.25 MG tablet ?Commonly known as: Xanax ?Stopped by: RHoward Pouch DO ?  ?CRANBERRY PO ?Stopped by: RHoward Pouch DO ?  ?phentermine 37.5 MG capsule ?Stopped by: RHoward Pouch DO ?  ?UNABLE TO FIND ?Stopped by: RHoward Pouch DO ?  ? ?  ? ?TAKE these medications   ? ?amLODipine 2.5 MG tablet ?Commonly known as: NORVASC ?Take 1 tablet (2.5 mg total) by mouth daily. ?Started by: RHoward Pouch DO ?  ?B-COMPLEX/B-12 PO ?Take by mouth. ?  ?BORON PO ?Take 240 mg by mouth. ?  ?CALCIUM PO ?Take by mouth. Stron bone ?  ?exemestane 25 MG tablet ?Commonly known as: AROMASIN ?Take 25 mg by mouth daily. ?  ?FISH OIL PO ?Take by mouth. ?  ?FLUoxetine 20 MG capsule ?Commonly known as: PROZAC ?Take 20 mg by mouth daily. ?  ?gabapentin 100 MG capsule ?Commonly known as: NEURONTIN ?Take 100-300 mg by mouth at bedtime. ?  ?GLUCOSAMINE CHONDR 500 COMPLEX PO ?Take 1 tablet by mouth as needed. ?  ?MAGNESIUM PO ?Take 500 mg by mouth. ?  ?omeprazole 40 MG capsule ?Commonly known as: PRILOSEC ?Take 1 capsule (40 mg total) by mouth  daily. ?  ?traZODone 50 MG tablet ?Commonly known as: DESYREL ?TAKE 1 TABLET BY MOUTH EVERYDAY AT BEDTIME ?What changed: See the new instructions. ?  ?VITAMIN D PO ?Take 5,000 mg by mouth. ?  ?[place]?Take by mouth. ?  ? ?  ? ? ?All past medical history,  surgical history, allergies, family history, immunizations andmedications were updated in the EMR today and reviewed under the history and medication portions of their EMR.    ? ?ROS ?Negative, with the exception of above mentioned in HPI ? ?Objective:  ?BP 132/88   Pulse 74   Temp 98.6 ?F (37 ?C) (Oral)   Ht 5' 1.25" (1.556 m)   Wt 191 lb (86.6 kg)   SpO2 98%   BMI 35.80 kg/m?  ?Body mass index is 35.8 kg/m?Marland Kitchen ?Physical Exam ?Vitals and nursing note reviewed.  ?Constitutional:   ?   General: She is not in acute distress. ?   Appearance: Normal appearance. She is not ill-appearing, toxic-appearing or diaphoretic.  ?HENT:  ?   Head: Normocephalic and atraumatic.  ?   Mouth/Throat:  ?   Mouth: Mucous membranes are moist.  ?Eyes:  ?   General: No scleral icterus.    ?   Right eye: No discharge.     ?   Left eye: No discharge.  ?   Extraocular Movements: Extraocular movements intact.  ?   Conjunctiva/sclera: Conjunctivae normal.  ?   Pupils: Pupils are equal, round, and reactive to light.  ?Cardiovascular:  ?   Rate and Rhythm: Normal rate and regular rhythm.  ?Pulmonary:  ?   Effort: Pulmonary effort is normal. No respiratory distress.  ?   Breath sounds: Normal breath sounds. No wheezing, rhonchi or rales.  ?Musculoskeletal:  ?   Cervical back: Neck supple. No tenderness.  ?Lymphadenopathy:  ?   Cervical: No cervical adenopathy.  ?Skin: ?   General: Skin is warm and dry.  ?   Coloration: Skin is not jaundiced or pale.  ?   Findings: No erythema or rash.  ?Neurological:  ?   Mental Status: She is alert and oriented to person, place, and time. Mental status is at baseline.  ?   Motor: No weakness.  ?   Gait: Gait normal.  ?Psychiatric:     ?   Mood  and Affect: Mood normal.     ?   Behavior: Behavior normal.     ?   Thought Content: Thought content normal.     ?   Judgment: Judgment normal.  ? ?No results found. ?No results found. ?No results found for this or any pr

## 2021-06-12 NOTE — Telephone Encounter (Signed)
Please inform patient her thyroid levels are normal. ?

## 2021-06-12 NOTE — Patient Instructions (Signed)
Start amlodipine 2.5 mg in the morning.  ? ?We will call you with lab result to rule out thyroid as a contributor.  ? ?Low salt diet.  ? ? ?Follow up in 5.5 mos.  ? ? ? ? ?

## 2021-06-13 NOTE — Telephone Encounter (Signed)
LVM for pt to CB regarding results.  

## 2021-06-14 NOTE — Telephone Encounter (Signed)
LVM for pt to CB regarding results.  

## 2021-06-17 NOTE — Telephone Encounter (Signed)
LVM for pt to CB regarding results.  

## 2021-06-24 NOTE — Telephone Encounter (Addendum)
Patient returned call regarding thyroid results. ? ?I informed they were normal per the below message from Dr. Raoul Pitch. (Copied and pasted from previous message) ?"Please inform patient her thyroid levels are normal" ? ?She understood. ? ?

## 2021-07-08 NOTE — Progress Notes (Signed)
[facility] ?New Patient Evaluation and Consultation ? ?Referring Provider: Tamela Gammon, NP ?PCP: [name], DO ?Date of Service: 07/09/2021 ? ?SUBJECTIVE ?Chief Complaint: New Patient (Initial Visit) Rebecca Morrison is a 51 y.o. female here for a consult on prolapse and incontinence) ? ?History of Present Illness: Rebecca Morrison is a 51 y.o. White or Caucasian female seen in consultation at the request of NP Marny Lowenstein for evaluation of incontinence.   ? ?Review of records from NP [place] significant for: ?Has had both stress and urge incontinence for over 10 years. Has also felt a vaginal bulge.  ? ?Urinary Symptoms: ?Leaks urine with cough/ sneeze, laughing, exercise, lifting, going from sitting to standing, during sex, with a full bladder, with movement to the bathroom, with urgency, and without sensation UUI > SUI ?Leaks 6-10 time(s) per day  ?Pad use: 1-2 pads per day.   ?She is bothered by her UI symptoms. ?No prior treatment for leakage.  ? ?Day time voids 6-8.  Nocturia: 1 times per night to void. ?Voiding dysfunction: she empties her bladder well.  ?does not use a catheter to empty bladder.  ?When urinating, she feels dribbling after finishing ?Drinks: 1 cup coffee in AM, water and diet sprite (clear soda), light beer or alcoholic seltzer in evening (sometimes several, other days none)  ? ?UTIs: 1 UTI's in the last year.   ?Denies history of blood in urine and kidney or bladder stones ? ?Pelvic Organ Prolapse Symptoms:                  ?She Admits to a feeling of a bulge the vaginal area. It has been present for 1 year.  ?She Denies seeing a bulge.  ?This bulge is bothersome. ?Has been doing kegels which has helped some, but still noticeable sometimes.  ? ?Bowel Symptom: ?Bowel movements: 1-3 time(s) per day ?Stool consistency: hard or soft  ?Straining: yes, sometimes ?Splinting: no.  ?Incomplete evacuation: no.  ?She Denies accidental bowel leakage / fecal  incontinence ?Bowel regimen: diet and fiber ? ?Sexual Function ?Sexually active: yes.  ?Pain with sex: deep in the pelvis ? ?Pelvic Pain ?Denies pelvic pain ? ? ?Past Medical History:  ?Past Medical History:  ?Diagnosis Date  ? Acute appendicitis 03/25/2020  ? Allergy   ? Anemia   ? pt states iron deficient  ? Back pain 03/05/2018  ? Cancer [facility])   ? left breast invasive ductal  ? Chronic pain of both knees 01/10/2019  ? Cyst of right breast 06/19/2014  ? US/mamm, confirmed cyst 43m  ? Depression with anxiety 01/18/2018  ? Family history of breast cancer   ? Family history of colon cancer   ? Family history of kidney cancer   ? Family history of leukemia   ? Family history of ovarian cancer   ? Fatty liver   ? Gallbladder polyp   ? Gallstone   ? GERD (gastroesophageal reflux disease)   ? Hiatal hernia   ? 3/1//2016 Dr. LGala Lewandowsky ? Hip pain 01/12/2019  ? Hypertension   ? Internal hemorrhoids   ? Menorrhagia   ? Murmur   ? Patient states echocardiogram was normal  ? Reflux esophagitis   ? Tinnitus   ? ENT evaluated  ? ? ? ?Past Surgical History:   ?Past Surgical History:  ?Procedure Laterality Date  ? AUGMENTATION MAMMAPLASTY  2022  ? done after mastectomy  ? BREAST REDUCTION SURGERY Bilateral 02/2019  ? COLONOSCOPY  07/10/2011  ?  rectal bleeding, FHX. Normal colonoscopy.   ? ESOPHAGOGASTRODUODENOSCOPY  03/27/2013  ? hiatal hernia/GERD  ? LEEP    ? MASTECTOMY  08/2020  ? bilateral  ? ? ? ?Past OB/GYN History: ?OB History  ?Gravida Para Term Preterm AB Living  ?'2 2       2  '$ ?SAB IAB Ectopic Multiple Live Births  ?        2  ?  ?# Outcome Date GA Lbr Len/2nd Weight Sex Delivery Anes PTL Lv  ?2 Para           ?1 Para           ? ? ?Vaginal deliveries: 2,  Forceps/ Vacuum deliveries: 0, Cesarean section: 0 ?Menopausal: LMP June 2022 ?Contraception: vasectomy. ?Last pap smear was 04/2021- negative.  Any history of abnormal pap smears: yes- history of LEEP. ? ? ?Medications: She has a current medication list which includes  the following prescription(s): amlodipine, b complex vitamins, boron, calcium, exemestane, fluoxetine, gabapentin, glucosamine-chondroit-vit c-mn, magnesium, multiple vitamins-minerals, omega-3 fatty acids, omeprazole, trazodone, and vitamin d.  ? ?Allergies: Patient has No Known Allergies.  ? ?Social History:  ?Social History  ? ?Tobacco Use  ? Smoking status: Former  ? Smokeless tobacco: Never  ?Vaping Use  ? Vaping Use: Never used  ?Substance Use Topics  ? Alcohol use: Yes  ?  Comment: 3-4 a night  ? Drug use: No  ? ? ?Relationship status: married ?She lives with husband and children.   ?She is not employed. ?Regular exercise: Yes: walking ? ? ?Family History:   ?Family History  ?Problem Relation Age of Onset  ? Arthritis Mother   ? Osteoporosis Mother   ?     "severe"  ? Kidney cancer Sister 34  ? Colon cancer Maternal Grandmother 69  ? Ovarian cancer Maternal Grandmother 61  ? Bone cancer Maternal Grandfather 6  ? Heart disease Maternal Grandfather   ? Breast cancer Paternal Grandmother   ?     dx >50  ? Rheum arthritis Maternal Great-grandmother   ? Leukemia Maternal Great-grandfather 21  ? ? ? ?Review of Systems: Review of Systems  ?Constitutional:  Negative for fever, malaise/fatigue and weight loss.  ?Respiratory:  Negative for cough, shortness of breath and wheezing.   ?Cardiovascular:  Negative for chest pain, palpitations and leg swelling.  ?Gastrointestinal:  Negative for abdominal pain and blood in stool.  ?Genitourinary:  Negative for dysuria.  ?Musculoskeletal:  Positive for myalgias.  ?Skin:  Negative for rash.  ?Neurological:  Negative for dizziness and headaches.  ?Endo/Heme/Allergies:  Bruises/bleeds easily.  ?     + hot flashes  ?Psychiatric/Behavioral:  Negative for depression. The patient is nervous/anxious.   ? ? ?OBJECTIVE ?Physical Exam: ?Vitals:  ? 07/09/21 1514  ?BP: (!) 134/97  ?Pulse: 78  ?Weight: 188 lb (85.3 kg)  ?Height: '5\' 2"'$  (1.575 m)  ? ? ?Physical Exam ?Constitutional:   ?    General: She is not in acute distress. ?Pulmonary:  ?   Effort: Pulmonary effort is normal.  ?Abdominal:  ?   General: There is no distension.  ?   Palpations: Abdomen is soft.  ?   Tenderness: There is no abdominal tenderness. There is no rebound.  ?Musculoskeletal:     ?   General: No swelling. Normal range of motion.  ?Skin: ?   General: Skin is warm and dry.  ?   Findings: No rash.  ?Neurological:  ?   Mental Status: She is alert  and oriented to person, place, and time.  ?Psychiatric:     ?   Mood and Affect: Mood normal.     ?   Behavior: Behavior normal.  ? ? ? ?GU / Detailed Urogynecologic Evaluation:  ?Pelvic Exam: Normal external female genitalia; Bartholin's and Skene's glands normal in appearance; urethral meatus normal in appearance, no urethral masses or discharge.  ? ?CST: negative ? ?Speculum exam reveals normal vaginal mucosa without atrophy. Cervix normal appearance. Uterus normal single, nontender. Adnexa no mass, fullness, tenderness.   ? ?Pelvic floor strength I/V ? ?Pelvic floor musculature: Right levator non-tender, Right obturator non-tender, Left levator non-tender, Left obturator non-tender ? ?POP-Q:  ? ?POP-Q ? ?-2  ?                                          Aa   ?-2 ?                                          Ba  ?-9  ?                                            C  ? ?4  ?                                          Gh  ?4  ?                                          Pb  ?10  ?                                          tvl  ? ?0  ?                                          Ap  ?0  ?                                          Bp  ?-9.5  ?                                            D  ? ? ? ?Rectal Exam:  ?Normal external rectum ? ?Post-Void Residual (PVR) by Bladder Scan: ?In order to evaluate bladder emptying, we discussed obtaining a postvoid residual and she agreed to this procedure. ? ?Procedure: The ultrasound unit was placed on the patient's abdomen in the suprapubic region after the patient  had voided. A PVR of 11 ml was obtained  by bladder scan. ? ?Laboratory Results: ?POC urine: negative ? ? ?ASSESSMENT AND PLAN ?Ms. Fair is a 51 y.o. with:  ?1. Prolapse of posterior vaginal wall   ?2. Overactive bladd

## 2021-07-09 ENCOUNTER — Encounter: Payer: Self-pay | Admitting: Obstetrics and Gynecology

## 2021-07-09 ENCOUNTER — Ambulatory Visit (INDEPENDENT_AMBULATORY_CARE_PROVIDER_SITE_OTHER): Payer: 59 | Admitting: Obstetrics and Gynecology

## 2021-07-09 VITALS — BP 134/97 | HR 78 | Ht 62.0 in | Wt 188.0 lb

## 2021-07-09 DIAGNOSIS — N816 Rectocele: Secondary | ICD-10-CM | POA: Diagnosis not present

## 2021-07-09 DIAGNOSIS — N393 Stress incontinence (female) (male): Secondary | ICD-10-CM | POA: Diagnosis not present

## 2021-07-09 DIAGNOSIS — R350 Frequency of micturition: Secondary | ICD-10-CM | POA: Diagnosis not present

## 2021-07-09 DIAGNOSIS — N3281 Overactive bladder: Secondary | ICD-10-CM

## 2021-07-09 LAB — POCT URINALYSIS DIPSTICK
Appearance: ABNORMAL
Bilirubin, UA: NEGATIVE
Blood, UA: NEGATIVE
Glucose, UA: NEGATIVE
Ketones, UA: NEGATIVE
Leukocytes, UA: NEGATIVE
Nitrite, UA: NEGATIVE
Protein, UA: NEGATIVE
Spec Grav, UA: 1.015 (ref 1.010–1.025)
Urobilinogen, UA: 0.2 E.U./dL
pH, UA: 7.5 (ref 5.0–8.0)

## 2021-07-09 NOTE — Patient Instructions (Addendum)
? ?  Today we talked about ways to manage bladder urgency such as altering your diet to avoid irritative beverages and foods (bladder diet) as well as attempting to decrease stress and other exacerbating factors.   ? ?The Most Bothersome Foods* The Least Bothersome Foods*  ?Coffee - Regular & Decaf ?Tea - caffeinated ?Carbonated beverages - cola, non-colas, diet & caffeine-free ?Alcohols - Beer, Red Wine, White Wine, [place] ?Fruits - Grapefruit, Lemon, Orange, Pineapple ?Fruit Juices - Cranberry, Grapefruit, Orange, Pineapple ?Vegetables - Tomato & Tomato Products ?Flavor Enhancers - Hot peppers, Spicy foods, Chili, Horseradish, Vinegar, Monosodium glutamate (MSG) ?Artificial Sweeteners - NutraSweet, Sweet 'N Low, Equal (sweetener), Saccharin ?Ethnic foods - [place], [place], [place] food Water ?Milk - low-fat & whole ?Fruits - Bananas, Blueberries, Honeydew melon, Pears, Raisins, Watermelon ?Vegetables - Broccoli, Brussels Sprouts, [place], Carrots, Cauliflower, [place], Cucumber, Mushrooms, Peas, Radishes, Squash, Zucchini, White potatoes, Sweet potatoes & yams ?Poultry - Chicken, Eggs, [place], ?Meat - Beef, Pork, Lamb ?Seafood - Shrimp, Tuna fish, Salmon ?Grains - Oat, Rice ?Snacks - Pretzels, Popcorn  ?Lissa Morales et al. Diet and its role in interstitial cystitis/bladder pain syndrome (IC/BPS) and comorbid conditions. Cartwright 2012 Jan 11.  ? ?We discussed the symptoms of overactive bladder (OAB), which include urinary urgency, urinary frequency, night-time urination, with or without urge incontinence.  We discussed management including behavioral therapy (decreasing bladder irritants by following a bladder diet, urge suppression strategies, timed voids, bladder retraining), physical therapy, medication. Other treatments also exist for refractory cases.  ? ?You have a stage 2 (out of 4) prolapse.  We discussed the fact that it is not life threatening but there are several treatment options.  For treatment of pelvic organ prolapse, we discussed options for management including expectant management, conservative management, and surgical management, such as Kegels, a pessary, pelvic floor physical therapy, and specific surgical procedures (posterior repair).  ? ?For treatment of stress urinary incontinence, which is leakage with physical activity/movement/strainging/coughing, we discussed expectant management versus nonsurgical options versus surgery. Nonsurgical options include weight loss, physical therapy, as well as a pessary.  Surgical options include a midurethral sling, which is a synthetic mesh sling that acts like a hammock under the urethra to prevent leakage of urine, and transurethral injection of a bulking agent. ? ? ?

## 2021-08-20 ENCOUNTER — Encounter: Payer: Self-pay | Admitting: Family Medicine

## 2021-08-20 ENCOUNTER — Ambulatory Visit (INDEPENDENT_AMBULATORY_CARE_PROVIDER_SITE_OTHER): Payer: 59 | Admitting: Family Medicine

## 2021-08-20 VITALS — BP 132/88 | HR 76 | Temp 98.1°F | Ht 62.0 in | Wt 191.0 lb

## 2021-08-20 DIAGNOSIS — R002 Palpitations: Secondary | ICD-10-CM | POA: Insufficient documentation

## 2021-08-20 DIAGNOSIS — G479 Sleep disorder, unspecified: Secondary | ICD-10-CM | POA: Diagnosis not present

## 2021-08-20 DIAGNOSIS — T753XXA Motion sickness, initial encounter: Secondary | ICD-10-CM | POA: Diagnosis not present

## 2021-08-20 DIAGNOSIS — I10 Essential (primary) hypertension: Secondary | ICD-10-CM | POA: Diagnosis not present

## 2021-08-20 DIAGNOSIS — F419 Anxiety disorder, unspecified: Secondary | ICD-10-CM | POA: Insufficient documentation

## 2021-08-20 MED ORDER — ALPRAZOLAM 0.25 MG PO TABS: 0.2500 mg | ORAL_TABLET | Freq: Every day | ORAL | 0 refills | Status: DC | PRN

## 2021-08-20 MED ORDER — BUPROPION HCL ER (XL) 150 MG PO TB24: 150.0000 mg | ORAL_TABLET | Freq: Every day | ORAL | 1 refills | Status: DC

## 2021-08-20 MED ORDER — ATENOLOL 25 MG PO TABS: 12.5000 mg | ORAL_TABLET | Freq: Every day | ORAL | 1 refills | Status: DC

## 2021-08-20 MED ORDER — SCOPOLAMINE 1 MG/3DAYS TD PT72
1.0000 | MEDICATED_PATCH | TRANSDERMAL | 0 refills | Status: DC
Start: 2021-08-20 — End: 2022-05-06

## 2021-08-26 ENCOUNTER — Other Ambulatory Visit: Payer: Self-pay | Admitting: Family Medicine

## 2021-10-08 ENCOUNTER — Encounter: Payer: Self-pay | Admitting: Family Medicine

## 2021-10-09 ENCOUNTER — Other Ambulatory Visit: Payer: Self-pay | Admitting: Family Medicine

## 2021-10-09 MED ORDER — ATENOLOL 25 MG PO TABS
25.0000 mg | ORAL_TABLET | Freq: Every day | ORAL | 0 refills | Status: DC
Start: 2021-10-09 — End: 2022-04-25

## 2021-10-09 NOTE — Telephone Encounter (Signed)
Please advise 

## 2021-10-09 NOTE — Progress Notes (Signed)
sent 

## 2021-10-09 NOTE — Telephone Encounter (Signed)
Message to provider for approval.

## 2021-10-09 NOTE — Telephone Encounter (Signed)
No further action needed.

## 2021-11-19 DIAGNOSIS — N393 Stress incontinence (female) (male): Secondary | ICD-10-CM | POA: Insufficient documentation

## 2021-11-19 DIAGNOSIS — N816 Rectocele: Secondary | ICD-10-CM | POA: Insufficient documentation

## 2021-12-16 ENCOUNTER — Other Ambulatory Visit: Payer: Self-pay | Admitting: Family Medicine

## 2022-01-08 ENCOUNTER — Other Ambulatory Visit: Payer: Self-pay | Admitting: Family Medicine

## 2022-01-12 ENCOUNTER — Other Ambulatory Visit: Payer: Self-pay | Admitting: Family Medicine

## 2022-02-19 ENCOUNTER — Other Ambulatory Visit: Payer: Self-pay | Admitting: Family Medicine

## 2022-03-04 ENCOUNTER — Encounter: Payer: Self-pay | Admitting: Family Medicine

## 2022-03-04 ENCOUNTER — Ambulatory Visit: Payer: 59 | Admitting: Family Medicine

## 2022-03-04 ENCOUNTER — Ambulatory Visit (INDEPENDENT_AMBULATORY_CARE_PROVIDER_SITE_OTHER): Payer: 59 | Admitting: Family Medicine

## 2022-03-04 VITALS — BP 109/78 | HR 83 | Temp 98.7°F | Ht 62.0 in | Wt 192.0 lb

## 2022-03-04 DIAGNOSIS — R051 Acute cough: Secondary | ICD-10-CM

## 2022-03-04 DIAGNOSIS — B349 Viral infection, unspecified: Secondary | ICD-10-CM

## 2022-03-04 LAB — POC COVID19 BINAXNOW: SARS Coronavirus 2 Ag: NEGATIVE

## 2022-03-04 MED ORDER — HYDROCODONE BIT-HOMATROP MBR 5-1.5 MG/5ML PO SOLN: 5.0000 mL | Freq: Three times a day (TID) | ORAL | 0 refills | Status: DC | PRN

## 2022-03-04 MED ORDER — CEFDINIR 300 MG PO CAPS: 300.0000 mg | ORAL_CAPSULE | Freq: Two times a day (BID) | ORAL | 0 refills | Status: DC

## 2022-03-04 NOTE — Progress Notes (Unsigned)
Rebecca Morrison , 24-Oct-1970, 52 y.o., female MRN: [id number] Patient Care Team    Relationship Specialty Notifications Start End  [name], DO PCP - General Family Medicine  09/03/15   Esaw Dace, MD Referring Physician Gastroenterology  03/31/19   Darrin Luis, MD Referring Physician Plastic Surgery  03/31/19   Mauro Kaufmann, RN Oncology Nurse Navigator   07/13/20   Rockwell Germany, RN Oncology Nurse Navigator   07/13/20   Jovita Kussmaul, MD Consulting Physician General Surgery  07/16/20   [name], MD Consulting Physician Hematology and Oncology  07/16/20   [name], MD Consulting Physician Radiation Oncology  07/16/20   [place] Physician Oncology  05/22/21   Fannie Knee, MD Referring Physician Oncology  06/12/21   Princess Bruins, MD Consulting Physician Obstetrics and Gynecology  06/12/21     Chief Complaint  Patient presents with   Cough    Pt c/o cough, post nasal drip, wheezing, x 3 days     Subjective: Pt presents for an OV with complaints of cough, postnasal drip and wheezing of approximately 3 days duration.  She reports she has been very fatigued.  She has tried many over-the-counter products for her sinuses and cough and is still waking coughing all night long.  She denies fevers or chills. Pt has tried DayQuil, NyQuil, Delsym and cough drops to ease their symptoms.      03/04/2022    2:40 PM 06/12/2021    8:40 AM 11/29/2019    3:12 PM 03/31/2019    8:38 AM 01/18/2018    4:21 PM  Depression screen PHQ 2/9  Decreased Interest 0 3 0 0 1  Down, Depressed, Hopeless 0 2 0 0 1  PHQ - 2 Score 0 5 0 0 2  Altered sleeping  2   3  Tired, decreased energy  3   3  Change in appetite  1   3  Feeling bad or failure about yourself   1   1  Trouble concentrating  1   1  Moving slowly or fidgety/restless  2   3  Suicidal thoughts  0   0  PHQ-9 Score  15   16  Difficult doing work/chores     Somewhat difficult    No Known  Allergies Social History   Social History Narrative   Married, husband Psychologist, clinical. 2 children Rebecca Morrison in [place].   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   Former smoker, quit 2003.   Social alcohol use, no drug use.   Drinks caffeinated beverages, uses herbal remedies, takes a daily vitamin.   Wears her seatbelt, wears a bicycle helmet, exercises at least 3 times a week.   Smoke detector in the home, feels safe in her relationships.   Past Medical History:  Diagnosis Date   Acute appendicitis 03/25/2020   Allergy    Anemia    pt states iron deficient   Back pain 03/05/2018   Cancer ([place])    left breast invasive ductal   Chronic pain of both knees 01/10/2019   Cyst of right breast 06/19/2014   US/mamm, confirmed cyst 29m   Depression with anxiety 01/18/2018   Family history of breast cancer    Family history of colon cancer    Family history of kidney cancer    Family history of leukemia    Family history of ovarian cancer    Fatty liver  Gallbladder polyp    Gallstone    GERD (gastroesophageal reflux disease)    Hiatal hernia    3/1//2016 Dr. Gala Lewandowsky   Hip pain 01/12/2019   Hypertension    Internal hemorrhoids    Menorrhagia    Murmur    Patient states echocardiogram was normal   Reflux esophagitis    Tinnitus    ENT evaluated   Past Surgical History:  Procedure Laterality Date   AUGMENTATION MAMMAPLASTY  2022   done after mastectomy   BREAST REDUCTION SURGERY Bilateral 02/2019   COLONOSCOPY  07/10/2011   rectal bleeding, FHX. Normal colonoscopy.    ESOPHAGOGASTRODUODENOSCOPY  03/27/2013   hiatal hernia/GERD   LEEP     MASTECTOMY  08/2020   bilateral   Family History  Problem Relation Age of Onset   Arthritis Mother    Osteoporosis Mother        "severe"   Kidney cancer Sister 37   Colon cancer Maternal Grandmother 87   Ovarian cancer Maternal Grandmother 44   Bone cancer Maternal Grandfather 57   Heart  disease Maternal Grandfather    Breast cancer Paternal Grandmother        dx >50   Rheum arthritis Maternal Great-grandmother    Leukemia Maternal Great-grandfather 39   Allergies as of 03/04/2022   No Known Allergies      Medication List        Accurate as of March 04, 2022 11:59 PM. If you have any questions, ask your nurse or doctor.          ALPRAZolam 0.25 MG tablet Commonly known as: XANAX Take 1 tablet (0.25 mg total) by mouth daily as needed for anxiety.   amLODipine 2.5 MG tablet Commonly known as: NORVASC TAKE 1 TABLET BY MOUTH EVERY DAY   atenolol 25 MG tablet Commonly known as: TENORMIN Take 1 tablet (25 mg total) by mouth at bedtime.   B-COMPLEX/B-12 PO Take by mouth.   BORON PO Take 240 mg by mouth.   buPROPion 150 MG 24 hr tablet Commonly known as: Wellbutrin XL Take 1 tablet (150 mg total) by mouth daily.   CALCIUM PO Take by mouth. Stron bone   cefdinir 300 MG capsule Commonly known as: OMNICEF Take 1 capsule (300 mg total) by mouth 2 (two) times daily. Started by: Howard Pouch, DO   exemestane 25 MG tablet Commonly known as: AROMASIN Take 25 mg by mouth daily.   FISH OIL PO Take by mouth.   FLUoxetine 20 MG capsule Commonly known as: PROZAC Take 20 mg by mouth daily.   GLUCOSAMINE CHONDR 500 COMPLEX PO Take 1 tablet by mouth as needed.   HYDROcodone bit-homatropine 5-1.5 MG/5ML syrup Commonly known as: HYCODAN Take 5 mLs by mouth every 8 (eight) hours as needed for cough. Started by: Howard Pouch, DO   MAGNESIUM PO Take 500 mg by mouth.   omeprazole 40 MG capsule Commonly known as: PRILOSEC TAKE 1 CAPSULE (40 MG TOTAL) BY MOUTH DAILY.   scopolamine 1 MG/3DAYS Commonly known as: TRANSDERM-SCOP Place 1 patch (1.5 mg total) onto the skin every 3 (three) days.   traZODone 50 MG tablet Commonly known as: DESYREL TAKE 1 TABLET BY MOUTH EVERYDAY AT BEDTIME   VITAMIN D PO Take 5,000 mg by mouth.   WOMENS BONE HEALTH  PO Take by mouth.        All past medical history, surgical history, allergies, family history, immunizations andmedications were updated in the EMR today and reviewed under  the history and medication portions of their EMR.     Review of Systems  Constitutional:  Positive for malaise/fatigue. Negative for chills and fever.  Respiratory:  Positive for cough and wheezing. Negative for shortness of breath.   Cardiovascular:  Positive for PND.   Negative, with the exception of above mentioned in HPI   Objective:  BP 109/78   Pulse 83   Temp 98.7 F (37.1 C) (Oral)   Ht '5\' 2"'$  (1.575 m)   Wt 192 lb (87.1 kg)   LMP 06/08/2019   SpO2 97%   BMI 35.12 kg/m  Body mass index is 35.12 kg/m. Physical Exam Vitals and nursing note reviewed.  Constitutional:      General: She is not in acute distress.    Appearance: Normal appearance. She is normal weight. She is not ill-appearing or toxic-appearing.  HENT:     Head: Normocephalic and atraumatic.     Right Ear: Tympanic membrane, ear canal and external ear normal.     Left Ear: Ear canal and external ear normal.     Nose: Congestion and rhinorrhea present.     Mouth/Throat:     Mouth: Mucous membranes are moist.     Pharynx: No oropharyngeal exudate or posterior oropharyngeal erythema.  Eyes:     Extraocular Movements: Extraocular movements intact.     Conjunctiva/sclera: Conjunctivae normal.     Pupils: Pupils are equal, round, and reactive to light.  Cardiovascular:     Rate and Rhythm: Normal rate and regular rhythm.  Pulmonary:     Effort: Pulmonary effort is normal.     Breath sounds: Normal breath sounds. No wheezing, rhonchi or rales.  Musculoskeletal:     Cervical back: Neck supple. No tenderness.  Lymphadenopathy:     Cervical: No cervical adenopathy.  Skin:    Findings: No rash.  Neurological:     Mental Status: She is alert and oriented to person, place, and time. Mental status is at baseline.  Psychiatric:         Mood and Affect: Mood normal.        Behavior: Behavior normal.        Thought Content: Thought content normal.        Judgment: Judgment normal.      No results found. No results found. Results for orders placed or performed in visit on 03/04/22 (from the past 24 hour(s))  POC COVID-19 BinaxNow     Status: Normal   Collection Time: 03/04/22  3:04 PM  Result Value Ref Range   SARS Coronavirus 2 Ag Negative Negative    Assessment/Plan: Arlanda Shiplett is a 52 y.o. female present for OV for  Acute cough/viral illness - POC COVID-19 BinaxNow-COVID is negative today Viral illness-mildly immunocompromised patient Rest, hydrate.  mucinex (DM if cough), nettie pot or nasal saline.  Continue DayQuil Hycodan cough syrup prescribed for nighttime Omnicef twice daily to start on day 7 if needed only.  Patient understands if she is feeling improvement in her symptoms she will not start the antibiotic. Follow-up in 2 weeks, sooner if needed  Reviewed expectations re: course of current medical issues. Discussed self-management of symptoms. Outlined signs and symptoms indicating need for more acute intervention. Patient verbalized understanding and all questions were answered. Patient received an After-Visit Summary.    Orders Placed This Encounter  Procedures   POC COVID-19 BinaxNow   Meds ordered this encounter  Medications   cefdinir (OMNICEF) 300 MG capsule    Sig: Take  1 capsule (300 mg total) by mouth 2 (two) times daily.    Dispense:  20 capsule    Refill:  0   DISCONTD: HYDROcodone bit-homatropine (HYCODAN) 5-1.5 MG/5ML syrup    Sig: Take 5 mLs by mouth every 8 (eight) hours as needed for cough.    Dispense:  120 mL    Refill:  0   Referral Orders  No referral(s) requested today     Note is dictated utilizing voice recognition software. Although note has been proof read prior to signing, occasional typographical errors still can be missed. If any questions arise,  please do not hesitate to call for verification.   electronically signed by:  Howard Pouch, DO  [facility]

## 2022-03-05 ENCOUNTER — Other Ambulatory Visit (HOSPITAL_BASED_OUTPATIENT_CLINIC_OR_DEPARTMENT_OTHER): Payer: Self-pay

## 2022-03-05 ENCOUNTER — Ambulatory Visit: Payer: 59 | Admitting: Family Medicine

## 2022-03-05 MED ORDER — HYDROCODONE BIT-HOMATROP MBR 5-1.5 MG/5ML PO SOLN
5.0000 mL | Freq: Three times a day (TID) | ORAL | 0 refills | Status: DC | PRN
  Filled 2022-03-05: qty 120, 8d supply, fill #0

## 2022-03-05 NOTE — Telephone Encounter (Signed)
Please advise on changing pharmacies for cough medication.

## 2022-03-05 NOTE — Telephone Encounter (Signed)
Sent Hycodan prescription to [facility]

## 2022-03-20 ENCOUNTER — Other Ambulatory Visit: Payer: Self-pay | Admitting: Family Medicine

## 2022-03-21 ENCOUNTER — Other Ambulatory Visit: Payer: Self-pay | Admitting: Family Medicine

## 2022-04-08 DIAGNOSIS — M8580 Other specified disorders of bone density and structure, unspecified site: Secondary | ICD-10-CM | POA: Insufficient documentation

## 2022-04-10 ENCOUNTER — Telehealth: Payer: Self-pay | Admitting: Family Medicine

## 2022-04-10 MED ORDER — AMLODIPINE BESYLATE 2.5 MG PO TABS: 2.5000 mg | ORAL_TABLET | Freq: Every day | ORAL | 0 refills | Status: DC

## 2022-04-10 NOTE — Telephone Encounter (Signed)
Rx sent for 15 d/s no further refills until seen in office

## 2022-04-10 NOTE — Telephone Encounter (Signed)
Pt called in about medication refill for amLODipine (NORVASC) 2.5 MG tablet . I explained to her it is due to needing an office visit. She then replies that I just saw her and I said yes and provided the date. I informed her it was not about medication. She said she forgot to mention she needed a refill. She is out of medication and ask that it can be called in. She declined appointment at time of call.

## 2022-04-16 ENCOUNTER — Ambulatory Visit: Payer: 59 | Admitting: Family Medicine

## 2022-04-16 NOTE — Progress Notes (Deleted)
OFFICE VISIT  04/16/2022  CC: No chief complaint on file.   Patient is a 52 y.o. female who presents for pain ***  HPI: ***   Past Medical History:  Diagnosis Date   Acute appendicitis 03/25/2020   Allergy    Anemia    pt states iron deficient   Back pain 03/05/2018   Cancer ([facility])    left breast invasive ductal   Chronic pain of both knees 01/10/2019   Cyst of right breast 06/19/2014   US/mamm, confirmed cyst 65m   Depression with anxiety 01/18/2018   Family history of breast cancer    Family history of colon cancer    Family history of kidney cancer    Family history of leukemia    Family history of ovarian cancer    Fatty liver    Gallbladder polyp    Gallstone    GERD (gastroesophageal reflux disease)    Hiatal hernia    3/1//2016 Dr. LGala Lewandowsky  Hip pain 01/12/2019   Hypertension    Internal hemorrhoids    Menorrhagia    Murmur    Patient states echocardiogram was normal   Reflux esophagitis    Tinnitus    ENT evaluated    Past Surgical History:  Procedure Laterality Date   AUGMENTATION MAMMAPLASTY  2022   done after mastectomy   BREAST REDUCTION SURGERY Bilateral 02/2019   COLONOSCOPY  07/10/2011   rectal bleeding, FHX. Normal colonoscopy.    ESOPHAGOGASTRODUODENOSCOPY  03/27/2013   hiatal hernia/GERD   LEEP     MASTECTOMY  08/2020   bilateral    Outpatient Medications Prior to Visit  Medication Sig Dispense Refill   ALPRAZolam (XANAX) 0.25 MG tablet Take 1 tablet (0.25 mg total) by mouth daily as needed for anxiety. 20 tablet 0   amLODipine (NORVASC) 2.5 MG tablet Take 1 tablet (2.5 mg total) by mouth daily. MUST HAVE OFFICE VISIT FOR FURTHER REFILLS 15 tablet 0   atenolol (TENORMIN) 25 MG tablet Take 1 tablet (25 mg total) by mouth at bedtime. 90 tablet 0   B Complex Vitamins (B-COMPLEX/B-12 PO) Take by mouth.     BORON PO Take 240 mg by mouth.     buPROPion (WELLBUTRIN XL) 150 MG 24 hr tablet Take 1 tablet (150 mg total) by mouth daily. 90  tablet 1   CALCIUM PO Take by mouth. Stron bone     cefdinir (OMNICEF) 300 MG capsule Take 1 capsule (300 mg total) by mouth 2 (two) times daily. 20 capsule 0   exemestane (AROMASIN) 25 MG tablet Take 25 mg by mouth daily.     FLUoxetine (PROZAC) 20 MG capsule Take 20 mg by mouth daily.     Glucosamine-Chondroit-Vit C-Mn (GLUCOSAMINE CHONDR 500 COMPLEX PO) Take 1 tablet by mouth as needed.     HYDROcodone bit-homatropine (HYCODAN) 5-1.5 MG/5ML syrup Take 5 mLs by mouth every 8 (eight) hours as needed for cough. 120 mL 0   MAGNESIUM PO Take 500 mg by mouth.     Multiple Vitamins-Minerals (WOMENS BONE HEALTH PO) Take by mouth.     Omega-3 Fatty Acids (FISH OIL PO) Take by mouth.     omeprazole (PRILOSEC) 40 MG capsule TAKE 1 CAPSULE (40 MG TOTAL) BY MOUTH DAILY. 90 capsule 3   scopolamine (TRANSDERM-SCOP) 1 MG/3DAYS Place 1 patch (1.5 mg total) onto the skin every 3 (three) days. 4 patch 0   traZODone (DESYREL) 50 MG tablet TAKE 1 TABLET BY MOUTH EVERYDAY AT BEDTIME  VITAMIN D PO Take 5,000 mg by mouth.     No facility-administered medications prior to visit.    No Known Allergies  Review of Systems  As per HPI  PE:    03/04/2022    2:34 PM 08/20/2021    9:47 AM 07/09/2021    3:14 PM  Vitals with BMI  Height 5' 2"$  5' 2"$  5' 2"$   Weight 192 lbs 191 lbs 188 lbs  BMI 35.11 [id number]  Systolic 0000000 [id number] [id number]  Diastolic 78 88 97  Pulse 83 76 78     Physical Exam  ***  LABS:  Last CBC Lab Results  Component Value Date   WBC 4.0 07/18/2020   HGB 12.2 07/18/2020   HCT 36.9 07/18/2020   MCV 90.0 07/18/2020   MCH 29.8 07/18/2020   RDW 13.2 07/18/2020   PLT 159 Q000111Q   Last metabolic panel Lab Results  Component Value Date   GLUCOSE 104 (H) 07/18/2020   NA 139 07/18/2020   K 4.3 07/18/2020   CL 103 07/18/2020   CO2 28 07/18/2020   BUN 9 07/18/2020   CREATININE 0.82 07/18/2020   GFRNONAA >60 07/18/2020   CALCIUM 9.7 07/18/2020   PROT 7.4 07/18/2020   ALBUMIN 4.2  07/18/2020   BILITOT 0.6 07/18/2020   ALKPHOS 65 07/18/2020   AST 24 07/18/2020   ALT 25 07/18/2020   ANIONGAP 8 07/18/2020      IMPRESSION AND PLAN:  No problem-specific Assessment & Plan notes found for this encounter.   An After Visit Summary was printed and given to the patient.  FOLLOW UP: No follow-ups on file.  Signed:  Crissie Sickles, MD           04/16/2022

## 2022-04-25 ENCOUNTER — Telehealth: Payer: Self-pay | Admitting: Family Medicine

## 2022-04-25 ENCOUNTER — Other Ambulatory Visit: Payer: Self-pay | Admitting: Family Medicine

## 2022-04-25 MED ORDER — ATENOLOL 25 MG PO TABS: 25.0000 mg | ORAL_TABLET | Freq: Every day | ORAL | 0 refills | Status: DC

## 2022-04-25 NOTE — Telephone Encounter (Signed)
Pt is currently out of Atenolol. She realized she was instructed to take a 1/2 tablet but has been taking a whole tablet, and recently  ran out about 3 days ago. The pharmacy is [facility] in [place]. She is scheduled  for March 12th.

## 2022-04-25 NOTE — Telephone Encounter (Signed)
Med last refilled 10/08/21 (90,0), Please fill, if appropriate.

## 2022-04-25 NOTE — Telephone Encounter (Signed)
Pt called back and now states that she is now experiencing some side effects from being off the medication. I advised per per policy medication refill request can take up to 48 hours.

## 2022-04-25 NOTE — Telephone Encounter (Signed)
Message previously sent to PCP

## 2022-04-28 ENCOUNTER — Other Ambulatory Visit: Payer: Self-pay | Admitting: Family Medicine

## 2022-05-06 ENCOUNTER — Encounter: Payer: Self-pay | Admitting: Family Medicine

## 2022-05-06 ENCOUNTER — Ambulatory Visit (INDEPENDENT_AMBULATORY_CARE_PROVIDER_SITE_OTHER): Payer: 59 | Admitting: Family Medicine

## 2022-05-06 VITALS — BP 124/89 | HR 93 | Temp 98.2°F | Wt 185.8 lb

## 2022-05-06 DIAGNOSIS — R002 Palpitations: Secondary | ICD-10-CM | POA: Diagnosis not present

## 2022-05-06 DIAGNOSIS — G479 Sleep disorder, unspecified: Secondary | ICD-10-CM

## 2022-05-06 DIAGNOSIS — F419 Anxiety disorder, unspecified: Secondary | ICD-10-CM | POA: Diagnosis not present

## 2022-05-06 DIAGNOSIS — K2100 Gastro-esophageal reflux disease with esophagitis, without bleeding: Secondary | ICD-10-CM

## 2022-05-06 DIAGNOSIS — I10 Essential (primary) hypertension: Secondary | ICD-10-CM

## 2022-05-06 MED ORDER — BUPROPION HCL ER (XL) 300 MG PO TB24: 300.0000 mg | ORAL_TABLET | Freq: Every day | ORAL | 1 refills | Status: DC

## 2022-05-06 MED ORDER — ATENOLOL 25 MG PO TABS: 25.0000 mg | ORAL_TABLET | Freq: Every day | ORAL | 1 refills | Status: DC

## 2022-05-06 MED ORDER — AMLODIPINE BESYLATE 2.5 MG PO TABS: 2.5000 mg | ORAL_TABLET | Freq: Every day | ORAL | 1 refills | Status: DC

## 2022-05-06 NOTE — Progress Notes (Signed)
Rebecca Morrison , 06-Oct-1970, 52 y.o., female MRN: [id number] Patient Care Team    Relationship Specialty Notifications Start End  [name], DO PCP - General Family Medicine  09/03/15   Esaw Dace, MD Referring Physician Gastroenterology  03/31/19   Darrin Luis, MD Referring Physician Plastic Surgery  03/31/19   Mauro Kaufmann, RN Oncology Nurse Navigator   07/13/20   Rockwell Germany, RN Oncology Nurse Navigator   07/13/20   Jovita Kussmaul, MD Consulting Physician General Surgery  07/16/20   [name], MD Consulting Physician Hematology and Oncology  07/16/20   [name], MD Consulting Physician Radiation Oncology  07/16/20   [place] Physician Oncology  05/22/21   Fannie Knee, MD Referring Physician Oncology  06/12/21   Princess Bruins, MD Consulting Physician Obstetrics and Gynecology  06/12/21     Chief Complaint  Patient presents with   Hypertension     Subjective: Rebecca Morrison  is a 52 y.o. female presents to office today to discuss two separate concerns.  Sleep disorder/anxiety: Patient reports compliance with Wellbutrin 150 mg XL, fluoxetine 20 mg qd. She has been prescribed gabapentin 100-300 mg nightly by her heme-onc team.  She feels she still having decreased focus.  She feels the Wellbutrin has helped a great deal with her cravings/binging.  Hypertension/palpitations: Pt reports compliance with amlodipine 2.5 mg qd.  Patient denies chest pain, shortness of breath, dizziness or lower extremity edema.  Prior note: Pt presents for an OV with complaints of elevated BP readings and tachycardia since starting her Lupron injections and aromatase tx for invasive breast cancer .Associated symptoms include weight gain and stress Pt dx with invasive carcinoma of left breast, has undergone bilateral mastectomy/reconstruction and is receiving 3.75 mg Depot Lupron injections through [facility]. She is also prescribed  aromatase. Her BP has been elevated since the start of the injections and aromatase. Reviewed last OV note at onc, she had a BP of 151/100.   Cbc, cmp reviewed in EMR from 02/2021> normal/stable for patient.      03/04/2022    2:40 PM 06/12/2021    8:40 AM 11/29/2019    3:12 PM 03/31/2019    8:38 AM 01/18/2018    4:21 PM  Depression screen PHQ 2/9  Decreased Interest 0 3 0 0 1  Down, Depressed, Hopeless 0 2 0 0 1  PHQ - 2 Score 0 5 0 0 2  Altered sleeping  2   3  Tired, decreased energy  3   3  Change in appetite  1   3  Feeling bad or failure about yourself   1   1  Trouble concentrating  1   1  Moving slowly or fidgety/restless  2   3  Suicidal thoughts  0   0  PHQ-9 Score  15   16  Difficult doing work/chores     Somewhat difficult    No Known Allergies Social History   Social History Narrative   Married, husband Psychologist, clinical. 2 children [name] in [place].   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   Former smoker, quit 2003.   Social alcohol use, no drug use.   Drinks caffeinated beverages, uses herbal remedies, takes a daily vitamin.   Wears her seatbelt, wears a bicycle helmet, exercises at least 3 times a week.   Smoke detector in the home, feels safe in her relationships.   Past Medical  History:  Diagnosis Date   Acute appendicitis 03/25/2020   Allergy    Anemia    pt states iron deficient   Back pain 03/05/2018   Cancer ([facility])    left breast invasive ductal   Chronic pain of both knees 01/10/2019   Cyst of right breast 06/19/2014   US/mamm, confirmed cyst 34m   Depression with anxiety 01/18/2018   Family history of breast cancer    Family history of colon cancer    Family history of kidney cancer    Family history of leukemia    Family history of ovarian cancer    Fatty liver    Gallbladder polyp    Gallstone    GERD (gastroesophageal reflux disease)    Hiatal hernia    3/1//2016 Dr. LGala Lewandowsky  Hip pain 01/12/2019    Hypertension    Internal hemorrhoids    Menorrhagia    Murmur    Patient states echocardiogram was normal   Reflux esophagitis    Tinnitus    ENT evaluated   Past Surgical History:  Procedure Laterality Date   AUGMENTATION MAMMAPLASTY  2022   done after mastectomy   BREAST REDUCTION SURGERY Bilateral 02/2019   COLONOSCOPY  07/10/2011   rectal bleeding, FHX. Normal colonoscopy.    ESOPHAGOGASTRODUODENOSCOPY  03/27/2013   hiatal hernia/GERD   LEEP     MASTECTOMY  08/2020   bilateral   ROBOTIC ASSISTED TOTAL HYSTERECTOMY  2023   Family History  Problem Relation Age of Onset   Arthritis Mother    Osteoporosis Mother        "severe"   Kidney cancer Sister 334  Colon cancer Maternal Grandmother 580  Ovarian cancer Maternal Grandmother 527  Bone cancer Maternal Grandfather 57   Heart disease Maternal Grandfather    Breast cancer Paternal Grandmother        dx >50   Rheum arthritis Maternal Great-grandmother    Leukemia Maternal Great-grandfather 562  Allergies as of 05/06/2022   No Known Allergies      Medication List        Accurate as of May 06, 2022  2:33 PM. If you have any questions, ask your nurse or doctor.          STOP taking these medications    BORON PO Stopped by: RHoward Pouch DO   cefdinir 300 MG capsule Commonly known as: OMNICEF Stopped by: RHoward Pouch DO   FLUoxetine 20 MG capsule Commonly known as: PROZAC Stopped by: RHoward Pouch DO   GLUCOSAMINE CHONDR 500 COMPLEX PO Stopped by: RHoward Pouch DO   HYDROcodone bit-homatropine 5-1.5 MG/5ML syrup Commonly known as: HYCODAN Stopped by: RHoward Pouch DO   omeprazole 40 MG capsule Commonly known as: PRILOSEC Stopped by: RHoward Pouch DO   scopolamine 1 MG/3DAYS Commonly known as: TRANSDERM-SCOP Stopped by: RHoward Pouch DO   traZODone 50 MG tablet Commonly known as: DESYREL Stopped by: RHoward Pouch DO       TAKE these medications    ALPRAZolam 0.25 MG  tablet Commonly known as: XANAX Take 1 tablet (0.25 mg total) by mouth daily as needed for anxiety.   amLODipine 2.5 MG tablet Commonly known as: NORVASC Take 1 tablet (2.5 mg total) by mouth daily. What changed: additional instructions Changed by: RHoward Pouch DO   atenolol 25 MG tablet Commonly known as: TENORMIN Take 1 tablet (25 mg total) by mouth at bedtime.   B-COMPLEX/B-12 PO Take by mouth.   buPROPion 300  MG 24 hr tablet Commonly known as: WELLBUTRIN XL Take 1 tablet (300 mg total) by mouth daily. What changed:  medication strength how much to take Changed by: Howard Pouch, DO   CALCIUM PO Take by mouth. Stron bone   exemestane 25 MG tablet Commonly known as: AROMASIN Take 25 mg by mouth daily.   FISH OIL PO Take by mouth.   MAGNESIUM PO Take 500 mg by mouth.   tirzepatide 5 MG/0.5ML Pen Commonly known as: ZEPBOUND Inject into the skin.   VITAMIN D PO Take 5,000 mg by mouth.   WOMENS BONE HEALTH PO Take by mouth.        All past medical history, surgical history, allergies, family history, immunizations andmedications were updated in the EMR today and reviewed under the history and medication portions of their EMR.     ROS Negative, with the exception of above mentioned in HPI  Objective:  BP 124/89   Pulse 93   Temp 98.2 F (36.8 C)   Wt 185 lb 12.8 oz (84.3 kg)   LMP 06/08/2019   SpO2 97%   BMI 33.98 kg/m  Body mass index is 33.98 kg/m. Physical Exam Vitals and nursing note reviewed.  Constitutional:      General: She is not in acute distress.    Appearance: Normal appearance. She is not ill-appearing, toxic-appearing or diaphoretic.  HENT:     Head: Normocephalic and atraumatic.  Eyes:     General: No scleral icterus.       Right eye: No discharge.        Left eye: No discharge.     Extraocular Movements: Extraocular movements intact.     Conjunctiva/sclera: Conjunctivae normal.     Pupils: Pupils are equal, round, and  reactive to light.  Cardiovascular:     Rate and Rhythm: Normal rate and regular rhythm.  Pulmonary:     Effort: Pulmonary effort is normal. No respiratory distress.     Breath sounds: Normal breath sounds. No wheezing, rhonchi or rales.  Musculoskeletal:     Right lower leg: No edema.     Left lower leg: No edema.  Skin:    General: Skin is warm and dry.     Coloration: Skin is not jaundiced or pale.     Findings: No erythema or rash.  Neurological:     Mental Status: She is alert and oriented to person, place, and time. Mental status is at baseline.     Motor: No weakness.     Gait: Gait normal.  Psychiatric:        Mood and Affect: Mood normal.        Behavior: Behavior normal.        Thought Content: Thought content normal.        Judgment: Judgment normal.     No results found. No results found. No results found for this or any previous visit (from the past 24 hour(s)).  Assessment/Plan: Rebecca Morrison is a 52 y.o. 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  Continue following with heme-onc  hypertension/palpitations Stable Continue amlodipine 2.5 mg daily. Continue atenolol 12.5 mg nightly. TSH>normal Low sodium diet encouraged.  F/u 5.5 mos, sooner if needed  Sleep disorder/anxiety: Stable increase Wellbutrin 300 mg  XL  Stop prozac Has not used trazodone.  Can continue gabapentin from heme-onc  Reviewed expectations re: course of current medical issues. Discussed self-management of symptoms. Outlined signs and symptoms indicating need for more acute intervention. Patient verbalized understanding and all questions were answered. Patient received an  After-Visit Summary.    No orders of the defined types were placed in this encounter.  Meds ordered this encounter  Medications   amLODipine (NORVASC) 2.5 MG tablet    Sig: Take 1 tablet (2.5 mg total) by mouth daily.    Dispense:  90 tablet    Refill:  1   atenolol (TENORMIN) 25 MG tablet    Sig: Take 1 tablet (25 mg total) by mouth at bedtime.    Dispense:  90 tablet    Refill:  1   buPROPion (WELLBUTRIN XL) 300 MG 24 hr tablet    Sig: Take 1 tablet (300 mg total) by mouth daily.    Dispense:  90 tablet    Refill:  1   Referral Orders  No referral(s) requested today     Note is dictated utilizing voice recognition software. Although note has been proof read prior to signing, occasional typographical errors still can be missed. If any questions arise, please do not hesitate to call for verification.   electronically signed by:  Howard Pouch, DO  [place]

## 2022-05-06 NOTE — Patient Instructions (Addendum)
Return in about 24 weeks (around 10/21/2022) for cpe (20 min), Routine chronic condition follow-up.        Great to see you today.  I have refilled the medication(s) we provide.   If labs were collected, we will inform you of lab results once received either by echart message or telephone call.   - echart message- for normal results that have been seen by the patient already.   - telephone call: abnormal results or if patient has not viewed results in their echart.

## 2022-05-17 ENCOUNTER — Other Ambulatory Visit: Payer: Self-pay | Admitting: Family Medicine

## 2022-06-12 ENCOUNTER — Other Ambulatory Visit (HOSPITAL_BASED_OUTPATIENT_CLINIC_OR_DEPARTMENT_OTHER): Payer: Self-pay

## 2022-06-12 MED ORDER — ZEPBOUND 5 MG/0.5ML ~~LOC~~ SOAJ
5.0000 mg | SUBCUTANEOUS | 0 refills | Status: DC
  Filled 2022-06-12: qty 2, 28d supply, fill #0

## 2022-06-19 IMAGING — US US BREAST*R* LIMITED INC AXILLA
1 series · 6 of 6 positions shown · non-contrast
Comparison: Previous exam(s).

CLINICAL DATA: Patient with recent diagnosis of left breast cancer.
Patient presents for palpable abnormality within the medial right
breast.

EXAM:
DIGITAL DIAGNOSTIC UNILATERAL RIGHT MAMMOGRAM WITH TOMOSYNTHESIS AND
CAD; ULTRASOUND RIGHT BREAST LIMITED
TECHNIQUE: Right digital diagnostic mammography and breast tomosynthesis was
performed. The images were evaluated with computer-aided detection.;
Targeted ultrasound examination of the right breast was performed

[Series 1: us breast*right* limited inc axilla · 0.06mm/px · 6 of 6 slices shown]
[im 1/6]
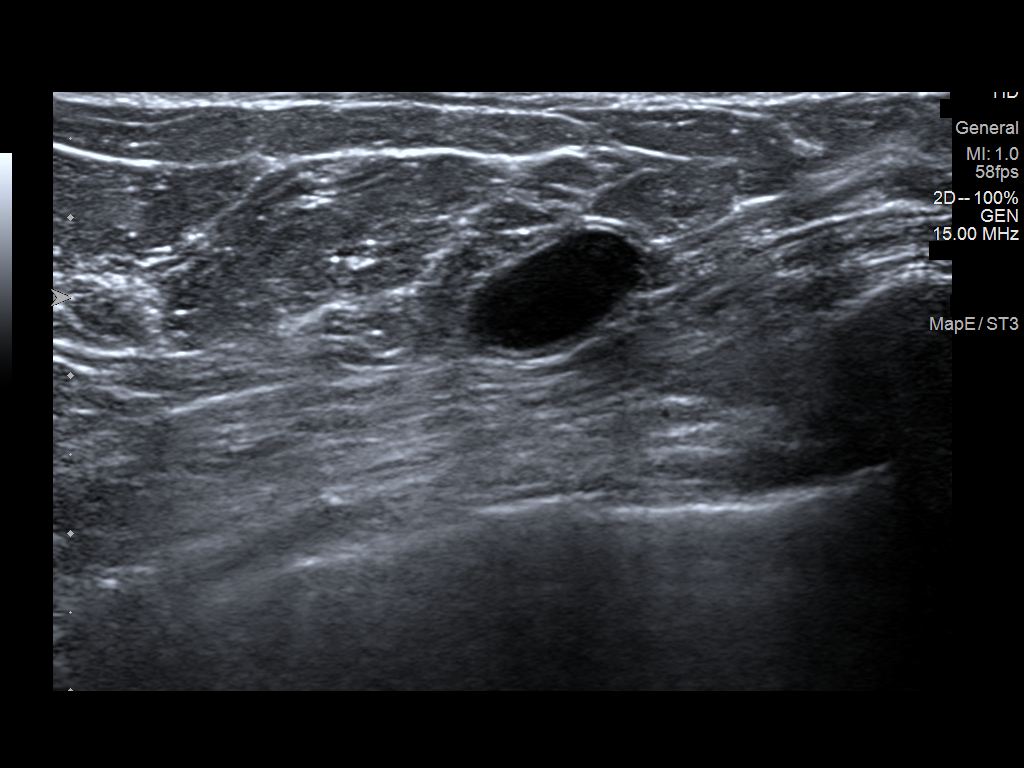
[im 2/6]
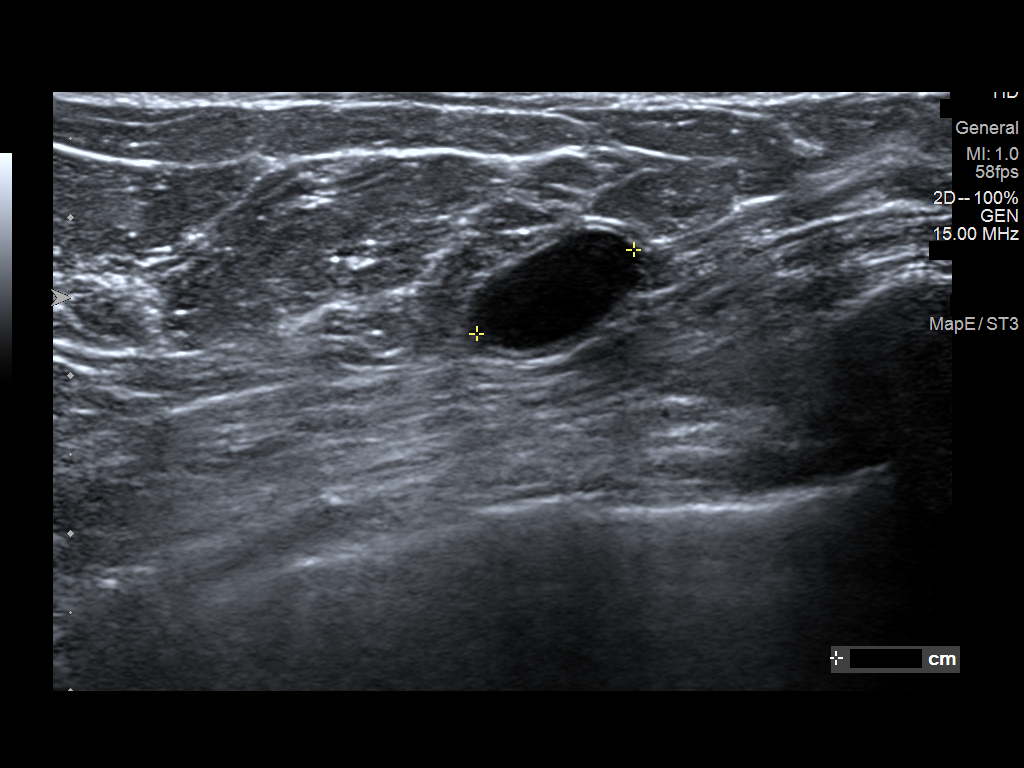
[im 3/6]
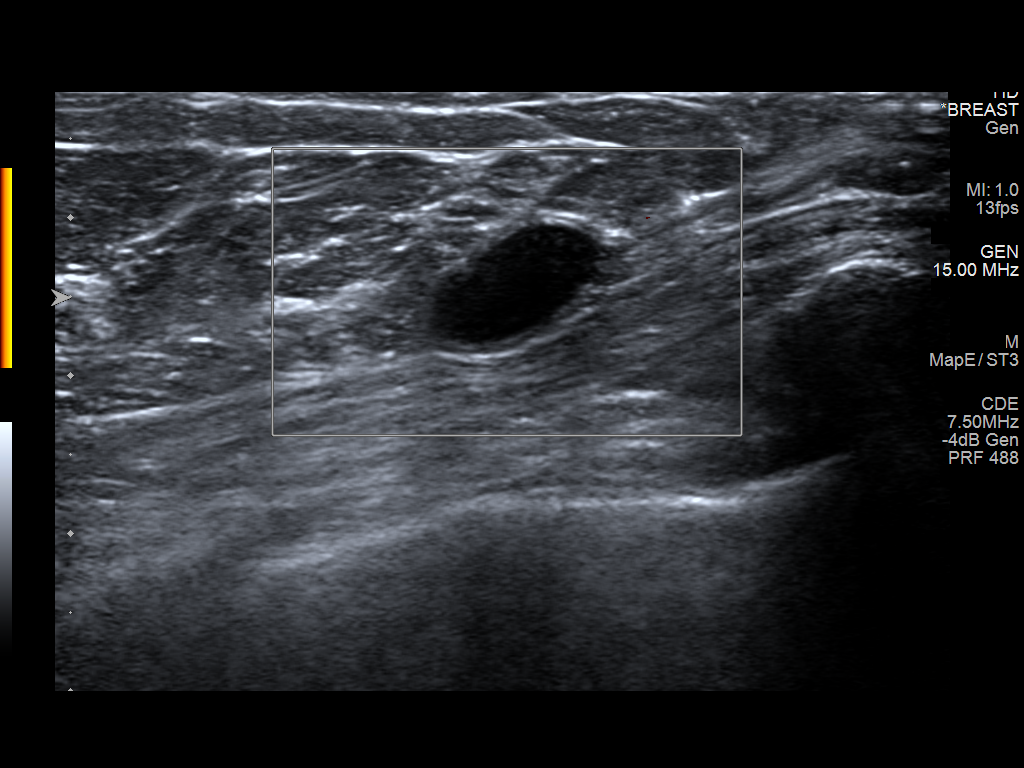
[im 4/6]
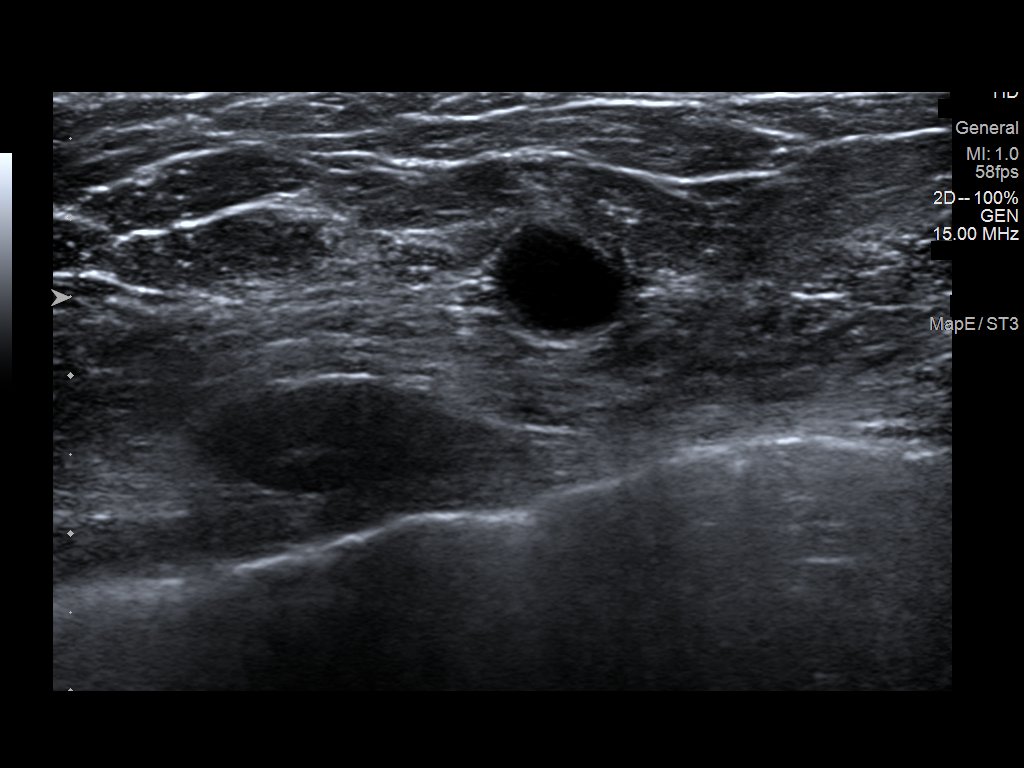
[im 5/6]
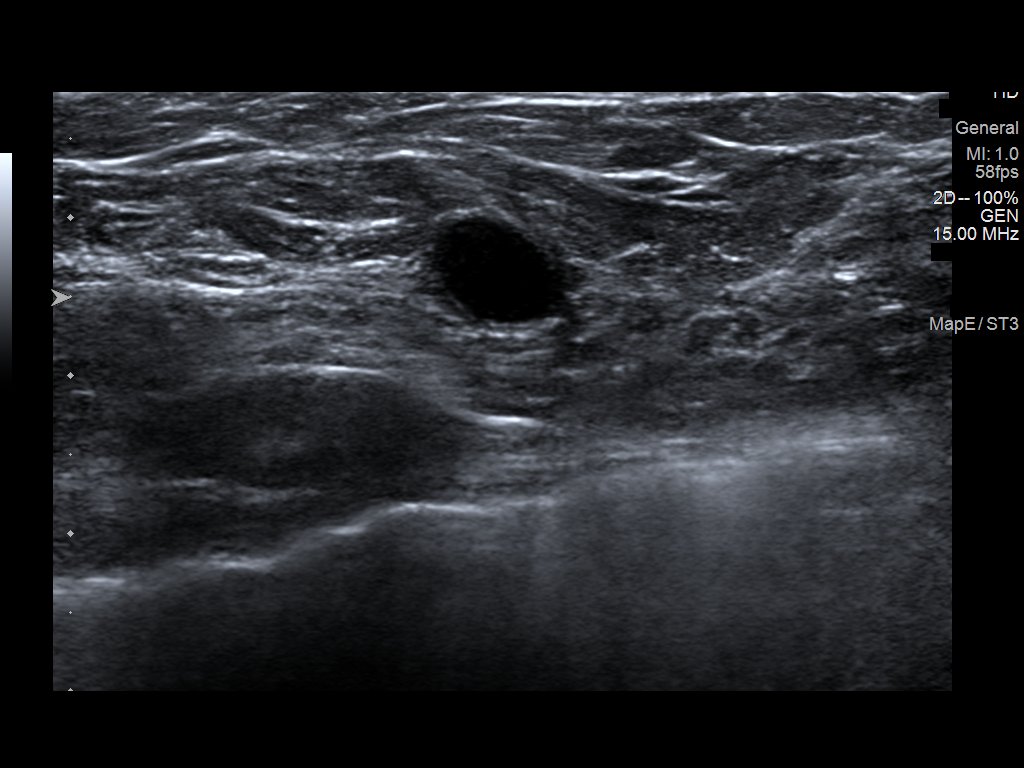
[im 6/6]
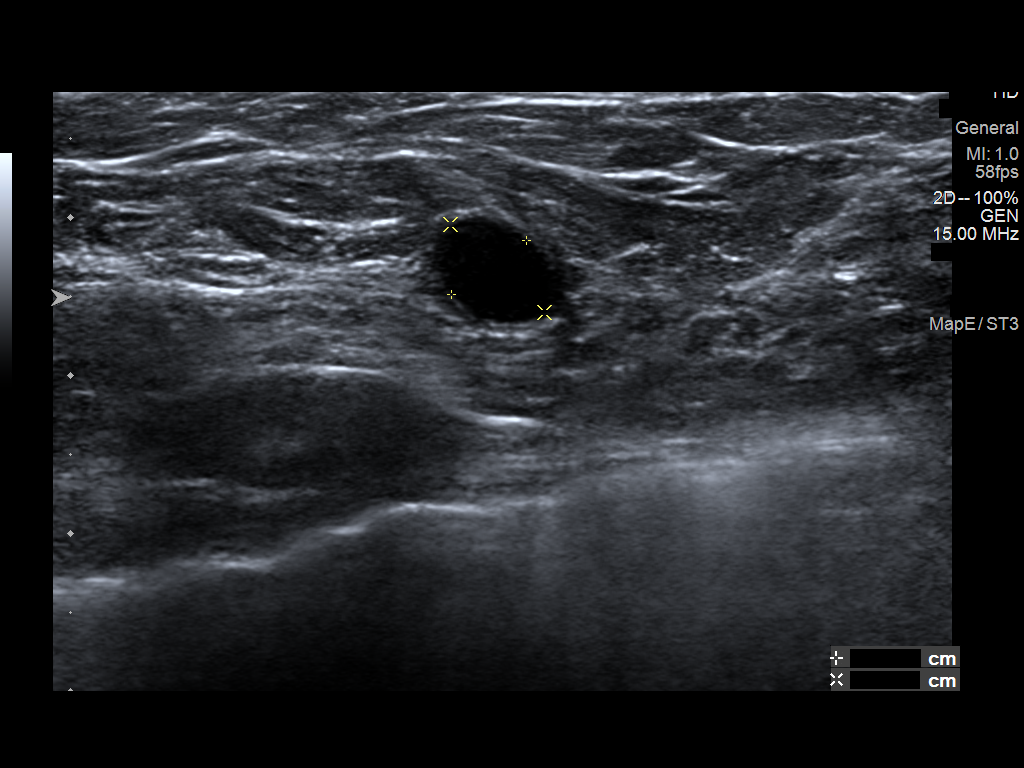

[6 of 6 positions shown; findings below may reference images not displayed]

ACR Breast Density Category b: There are scattered areas of
fibroglandular density.
FINDINGS: Postreduction changes within the right breast. No new masses,
calcifications or nonsurgical distortion identified.

On physical exam, dense tissue is palpated within the medial right
breast.

Targeted ultrasound is performed, showing a 6 x 8 x 11 mm cyst right
breast 4 o'clock position 9 cm from nipple compatible with benign
process at the site of palpable concern.
IMPRESSION: No mammographic evidence for malignancy.  Post reduction changes.

RECOMMENDATION:
Treatment plan for known left breast malignancy.

Screening of the right breast in conjunction with imaging of the
left breast as indicated based upon surgical treatment of the left
breast.

I have discussed the findings and recommendations with the patient.
If applicable, a reminder letter will be sent to the patient
regarding the next appointment.

BI-RADS CATEGORY  2: Benign.

## 2022-06-27 ENCOUNTER — Other Ambulatory Visit (HOSPITAL_COMMUNITY): Payer: Self-pay

## 2022-06-27 MED ORDER — ZEPBOUND 5 MG/0.5ML ~~LOC~~ SOAJ
5.0000 mg | SUBCUTANEOUS | 0 refills | Status: DC
  Filled 2022-06-27 – 2022-08-01 (×2): qty 2, 28d supply, fill #0

## 2022-07-03 ENCOUNTER — Other Ambulatory Visit (HOSPITAL_COMMUNITY): Payer: Self-pay

## 2022-07-03 MED ORDER — ZEPBOUND 5 MG/0.5ML ~~LOC~~ SOAJ
5.0000 mg | SUBCUTANEOUS | 0 refills | Status: DC
  Filled 2022-07-03: qty 2, 28d supply, fill #0

## 2022-08-01 ENCOUNTER — Other Ambulatory Visit (HOSPITAL_COMMUNITY): Payer: Self-pay

## 2022-08-02 ENCOUNTER — Other Ambulatory Visit (HOSPITAL_COMMUNITY): Payer: Self-pay

## 2022-08-04 ENCOUNTER — Other Ambulatory Visit (HOSPITAL_COMMUNITY): Payer: Self-pay

## 2022-08-07 ENCOUNTER — Other Ambulatory Visit (HOSPITAL_COMMUNITY): Payer: Self-pay

## 2022-08-13 ENCOUNTER — Encounter: Payer: Self-pay | Admitting: Family Medicine

## 2022-08-13 NOTE — Telephone Encounter (Signed)
Please encourage patient to make a virtual appointment to discuss changes

## 2022-09-01 ENCOUNTER — Other Ambulatory Visit (HOSPITAL_BASED_OUTPATIENT_CLINIC_OR_DEPARTMENT_OTHER): Payer: Self-pay

## 2022-09-01 MED ORDER — ZEPBOUND 7.5 MG/0.5ML ~~LOC~~ SOAJ
7.5000 mg | SUBCUTANEOUS | 0 refills | Status: DC
  Filled 2022-09-01: qty 2, 28d supply, fill #0

## 2022-09-02 ENCOUNTER — Other Ambulatory Visit (HOSPITAL_BASED_OUTPATIENT_CLINIC_OR_DEPARTMENT_OTHER): Payer: Self-pay

## 2022-09-16 ENCOUNTER — Other Ambulatory Visit (HOSPITAL_BASED_OUTPATIENT_CLINIC_OR_DEPARTMENT_OTHER): Payer: Self-pay

## 2022-09-16 MED ORDER — ZEPBOUND 7.5 MG/0.5ML ~~LOC~~ SOAJ
7.5000 mg | SUBCUTANEOUS | 0 refills | Status: DC
  Filled 2022-09-29: qty 2, 28d supply, fill #0
  Filled ????-??-?? (×2): fill #0

## 2022-09-18 ENCOUNTER — Other Ambulatory Visit (HOSPITAL_BASED_OUTPATIENT_CLINIC_OR_DEPARTMENT_OTHER): Payer: Self-pay

## 2022-09-29 ENCOUNTER — Other Ambulatory Visit (HOSPITAL_BASED_OUTPATIENT_CLINIC_OR_DEPARTMENT_OTHER): Payer: Self-pay

## 2022-10-06 ENCOUNTER — Other Ambulatory Visit (HOSPITAL_BASED_OUTPATIENT_CLINIC_OR_DEPARTMENT_OTHER): Payer: Self-pay

## 2022-10-17 ENCOUNTER — Other Ambulatory Visit (HOSPITAL_BASED_OUTPATIENT_CLINIC_OR_DEPARTMENT_OTHER): Payer: Self-pay

## 2022-10-17 MED ORDER — ZEPBOUND 10 MG/0.5ML ~~LOC~~ SOAJ
10.0000 mg | SUBCUTANEOUS | 1 refills | Status: DC
  Filled 2022-10-17: qty 2, 28d supply, fill #0

## 2022-11-01 ENCOUNTER — Other Ambulatory Visit: Payer: Self-pay | Admitting: Family Medicine

## 2022-11-17 ENCOUNTER — Other Ambulatory Visit: Payer: Self-pay

## 2022-12-11 ENCOUNTER — Other Ambulatory Visit: Payer: Self-pay

## 2022-12-11 ENCOUNTER — Other Ambulatory Visit (HOSPITAL_BASED_OUTPATIENT_CLINIC_OR_DEPARTMENT_OTHER): Payer: Self-pay

## 2022-12-11 MED ORDER — ZEPBOUND 10 MG/0.5ML ~~LOC~~ SOAJ
10.0000 mg | SUBCUTANEOUS | 1 refills | Status: DC
  Filled 2022-12-11: qty 2, 28d supply, fill #0
  Filled 2023-01-09: qty 2, 28d supply, fill #1

## 2022-12-12 ENCOUNTER — Other Ambulatory Visit (HOSPITAL_BASED_OUTPATIENT_CLINIC_OR_DEPARTMENT_OTHER): Payer: Self-pay

## 2022-12-12 MED ORDER — ZEPBOUND 10 MG/0.5ML ~~LOC~~ SOAJ
10.0000 mg | SUBCUTANEOUS | 2 refills | Status: DC
  Filled 2022-12-12 – 2023-02-09 (×2): qty 2, 28d supply, fill #0

## 2022-12-15 ENCOUNTER — Other Ambulatory Visit: Payer: Self-pay

## 2022-12-15 NOTE — Telephone Encounter (Signed)
RF request for amLODipine (NORVASC) 2.5 MG tablet   LOV: 05/06/22 Next ov: Return in about 24 weeks (around 10/21/2022) for cpe (20 min), Routine chronic condition follow-up.  Last written: 11/03/22 (30,0)

## 2022-12-18 ENCOUNTER — Other Ambulatory Visit: Payer: Self-pay | Admitting: Family Medicine

## 2022-12-26 ENCOUNTER — Ambulatory Visit: Payer: 59 | Admitting: Family Medicine

## 2022-12-26 ENCOUNTER — Encounter: Payer: Self-pay | Admitting: Family Medicine

## 2022-12-26 VITALS — BP 97/62 | HR 60 | Temp 98.2°F | Ht 62.0 in | Wt 148.0 lb

## 2022-12-26 DIAGNOSIS — R413 Other amnesia: Secondary | ICD-10-CM | POA: Diagnosis not present

## 2022-12-26 DIAGNOSIS — R002 Palpitations: Secondary | ICD-10-CM

## 2022-12-26 DIAGNOSIS — L720 Epidermal cyst: Secondary | ICD-10-CM

## 2022-12-26 DIAGNOSIS — F419 Anxiety disorder, unspecified: Secondary | ICD-10-CM | POA: Diagnosis not present

## 2022-12-26 DIAGNOSIS — G479 Sleep disorder, unspecified: Secondary | ICD-10-CM | POA: Diagnosis not present

## 2022-12-26 DIAGNOSIS — L989 Disorder of the skin and subcutaneous tissue, unspecified: Secondary | ICD-10-CM

## 2022-12-26 LAB — VITAMIN D 25 HYDROXY (VIT D DEFICIENCY, FRACTURES): VITD: 70.5 ng/mL (ref 30.00–100.00)

## 2022-12-26 LAB — TSH: TSH: 1.72 u[IU]/mL (ref 0.35–5.50)

## 2022-12-26 LAB — B12 AND FOLATE PANEL
Folate: 19.1 ng/mL (ref >5.9)
Vitamin B-12: 701 pg/mL (ref 211–911)

## 2022-12-26 MED ORDER — BUPROPION HCL ER (XL) 300 MG PO TB24: 300.0000 mg | ORAL_TABLET | Freq: Every day | ORAL | 1 refills | Status: DC

## 2022-12-26 MED ORDER — TRAZODONE HCL 50 MG PO TABS: 50.0000 mg | ORAL_TABLET | Freq: Every evening | ORAL | 1 refills | Status: DC | PRN

## 2022-12-26 MED ORDER — ATENOLOL 25 MG PO TABS: 25.0000 mg | ORAL_TABLET | Freq: Every day | ORAL | 1 refills | Status: DC

## 2022-12-26 NOTE — Progress Notes (Signed)
Rebecca Morrison , 1970/07/02, 52 y.o., female MRN: [id number] Patient Care Team    Relationship Specialty Notifications Start End  Natalia Leatherwood, DO PCP - General Family Medicine  09/03/15   Rinaldo Ratel, MD Referring Physician Gastroenterology  03/31/19   Westley Foots, MD Referring Physician Plastic Surgery  03/31/19   Pershing Proud, RN Oncology Nurse Navigator   07/13/20   Donnelly Angelica, RN Oncology Nurse Navigator   07/13/20   Griselda Miner, MD Consulting Physician General Surgery  07/16/20   Serena Croissant, MD Consulting Physician Hematology and Oncology  07/16/20   Dorothy Puffer, MD Consulting Physician Radiation Oncology  07/16/20   [facility] Consulting Physician Oncology  05/22/21   Ronny Bacon, MD Referring Physician Oncology  06/12/21   Genia Del, MD Consulting Physician Obstetrics and Gynecology  06/12/21     Chief Complaint  Patient presents with   Medical Management of Chronic Issues    Pt is not fasting     Subjective: Rebecca Morrison  is a 52 y.o. female presents to office today to discuss two separate concerns.  Sleep disorder/anxiety: Patient reports compliance with Wellbutrin 300 mg XL, She feels she still having decreased focus.  She feels the Wellbutrin has helped a great deal with her cravings/binging.  Hypertension/palpitations: Pt reports compliance with amlodipine 2.5 mg qd and atenolol 12.5-25 mg nightly Patient denies chest pain, shortness of breath, dizziness or lower extremity edema.   Prior note: Pt presents for an OV with complaints of elevated BP readings and tachycardia since starting her Lupron injections and aromatase tx for invasive breast cancer .Associated symptoms include weight gain and stress Pt dx with invasive carcinoma of left breast, has undergone bilateral mastectomy/reconstruction and is receiving 3.75 mg Depot Lupron injections through [facility]. She is also prescribed aromatase. Her BP has  been elevated since the start of the injections and aromatase. Reviewed last OV note at onc, she had a BP of 151/100.   Cbc, cmp reviewed in EMR from 02/2021> normal/stable for patient.      12/26/2022    9:47 AM 03/04/2022    2:40 PM 06/12/2021    8:40 AM 11/29/2019    3:12 PM 03/31/2019    8:38 AM  Depression screen PHQ 2/9  Decreased Interest 1 0 3 0 0  Down, Depressed, Hopeless 0 0 2 0 0  PHQ - 2 Score 1 0 5 0 0  Altered sleeping 3  2    Tired, decreased energy 1  3    Change in appetite 0  1    Feeling bad or failure about yourself  0  1    Trouble concentrating 2  1    Moving slowly or fidgety/restless 0  2    Suicidal thoughts 0  0    PHQ-9 Score 7  15    Difficult doing work/chores Somewhat difficult        No Known Allergies Social History   Social History Narrative   Married, husband Publishing copy. 2 children Rebecca Morrison in [place].   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   Former smoker, quit 2003.   Social alcohol use, no drug use.   Drinks caffeinated beverages, uses herbal remedies, takes a daily vitamin.   Wears her seatbelt, wears a bicycle helmet, exercises at least 3 times a week.   Smoke detector in the home, feels safe in her relationships.   Past Medical History:  Diagnosis Date   Acute appendicitis 03/25/2020   Allergy    Anemia    pt states iron deficient   Back pain 03/05/2018   Cancer ([facility])    left breast invasive ductal   Chronic pain of both knees 01/10/2019   Cyst of right breast 06/19/2014   US/mamm, confirmed cyst 9mm   Depression with anxiety 01/18/2018   Family history of breast cancer    Family history of colon cancer    Family history of kidney cancer    Family history of leukemia    Family history of ovarian cancer    Fatty liver    Gallbladder polyp    Gallstone    GERD (gastroesophageal reflux disease)    Hiatal hernia    3/1//2016 Dr. Dorene Grebe   Hip pain 01/12/2019   Hypertension    Internal  hemorrhoids    Menorrhagia    Murmur    Patient states echocardiogram was normal   Reflux esophagitis    Tinnitus    ENT evaluated   Past Surgical History:  Procedure Laterality Date   AUGMENTATION MAMMAPLASTY  2022   done after mastectomy   BREAST REDUCTION SURGERY Bilateral 02/2019   COLONOSCOPY  07/10/2011   rectal bleeding, FHX. Normal colonoscopy.    ESOPHAGOGASTRODUODENOSCOPY  03/27/2013   hiatal hernia/GERD   LEEP     MASTECTOMY  08/2020   bilateral   ROBOTIC ASSISTED TOTAL HYSTERECTOMY  2023   Family History  Problem Relation Age of Onset   Arthritis Mother    Osteoporosis Mother        "severe"   Kidney cancer Sister 10   Colon cancer Maternal Grandmother 67   Ovarian cancer Maternal Grandmother 41   Bone cancer Maternal Grandfather 57   Heart disease Maternal Grandfather    Breast cancer Paternal Grandmother        dx >50   Rheum arthritis Maternal Great-grandmother    Leukemia Maternal Great-grandfather 27   Allergies as of 12/26/2022   No Known Allergies      Medication List        Accurate as of December 26, 2022  3:26 PM. If you have any questions, ask your nurse or doctor.          STOP taking these medications    ALPRAZolam 0.25 MG tablet Commonly known as: XANAX Stopped by: Felix Pacini   amLODipine 2.5 MG tablet Commonly known as: NORVASC Stopped by: Felix Pacini       TAKE these medications    atenolol 25 MG tablet Commonly known as: TENORMIN Take 1 tablet (25 mg total) by mouth at bedtime.   B-COMPLEX/B-12 PO Take by mouth.   buPROPion 300 MG 24 hr tablet Commonly known as: WELLBUTRIN XL Take 1 tablet (300 mg total) by mouth daily.   CALCIUM PO Take by mouth. Stron bone   exemestane 25 MG tablet Commonly known as: AROMASIN Take 25 mg by mouth daily.   FISH OIL PO Take by mouth.   MAGNESIUM PO Take 500 mg by mouth.   traZODone 50 MG tablet Commonly known as: DESYREL Take 1 tablet (50 mg total) by mouth at  bedtime as needed for sleep. Started by: Felix Pacini   VITAMIN D PO Take 5,000 mg by mouth.   WOMENS BONE HEALTH PO Take by mouth.   Zepbound 10 MG/0.5ML Pen Generic drug: tirzepatide Inject 10 mg into the skin once a week. What changed: Another medication with the same name was removed. Continue  taking this medication, and follow the directions you see here. Changed by: Felix Pacini   Zepbound 10 MG/0.5ML Pen Generic drug: tirzepatide Inject 10 mg into the skin once a week. What changed: Another medication with the same name was removed. Continue taking this medication, and follow the directions you see here. Changed by: Felix Pacini        All past medical history, surgical history, allergies, family history, immunizations andmedications were updated in the EMR today and reviewed under the history and medication portions of their EMR.     ROS Negative, with the exception of above mentioned in HPI  Objective:  BP 97/62   Pulse 60   Temp 98.2 F (36.8 C) (Oral)   Ht 5\' 2"  (1.575 m)   Wt 148 lb (67.1 kg)   LMP 06/08/2019   SpO2 97%   BMI 27.07 kg/m  Body mass index is 27.07 kg/m. Physical Exam Vitals and nursing note reviewed.  Constitutional:      General: She is not in acute distress.    Appearance: Normal appearance. She is not ill-appearing, toxic-appearing or diaphoretic.  HENT:     Head: Normocephalic and atraumatic.  Eyes:     General: No scleral icterus.       Right eye: No discharge.        Left eye: No discharge.     Extraocular Movements: Extraocular movements intact.     Conjunctiva/sclera: Conjunctivae normal.     Pupils: Pupils are equal, round, and reactive to light.  Cardiovascular:     Rate and Rhythm: Normal rate and regular rhythm.  Pulmonary:     Effort: Pulmonary effort is normal. No respiratory distress.     Breath sounds: Normal breath sounds. No wheezing, rhonchi or rales.  Musculoskeletal:     Right lower leg: No edema.     Left  lower leg: No edema.  Skin:    General: Skin is warm and dry.     Coloration: Skin is not jaundiced or pale.     Findings: Lesion (X 4 raised scaly hyperpigmented lesions of trunk.  X 2 epidermal cysts of back) present. No erythema or rash.  Neurological:     Mental Status: She is alert and oriented to person, place, and time. Mental status is at baseline.     Motor: No weakness.     Gait: Gait normal.  Psychiatric:        Mood and Affect: Mood normal.        Behavior: Behavior normal.        Thought Content: Thought content normal.        Judgment: Judgment normal.     No results found. No results found. No results found for this or any previous visit (from the past 24 hour(s)).  Assessment/Plan: Amber Williard is a 52 y.o. female present for OV for  Hypertension/palpitations Stable Discontinue amlodipine 2.5 mg daily. Continue atenolol 12.5 mg nightly. Low sodium diet encouraged.  F/u 5.5 mos, sooner if needed  Sleep disorder/anxiety: Stable Continue Wellbutrin 300 mg XL  Restart trazodone 50 mg nightly  Memory changes Patient has noticed decrease in attention over the last year.  She has a history of vitamin B-12 and vitamin D deficiency, and not currently taking supplements. - TSH - B12 and Folate Panel - Vitamin D (25 hydroxy)  Epidermal cyst X 2 epidermal cyst left upper back - Ambulatory referral to Dermatology  Skin lesion X 4 mildly raised hyperpigmented lesions of trunk -  Ambulatory referral to Dermatology  Reviewed expectations re: course of current medical issues. Discussed self-management of symptoms. Outlined signs and symptoms indicating need for more acute intervention. Patient verbalized understanding and all questions were answered. Patient received an After-Visit Summary.    Orders Placed This Encounter  Procedures   TSH   B12 and Folate Panel   Vitamin D (25 hydroxy)   Ambulatory referral to Dermatology   Meds ordered this encounter   Medications   buPROPion (WELLBUTRIN XL) 300 MG 24 hr tablet    Sig: Take 1 tablet (300 mg total) by mouth daily.    Dispense:  90 tablet    Refill:  1   atenolol (TENORMIN) 25 MG tablet    Sig: Take 1 tablet (25 mg total) by mouth at bedtime.    Dispense:  90 tablet    Refill:  1   traZODone (DESYREL) 50 MG tablet    Sig: Take 1 tablet (50 mg total) by mouth at bedtime as needed for sleep.    Dispense:  90 tablet    Refill:  1   Referral Orders         Ambulatory referral to Dermatology       Note is dictated utilizing voice recognition software. Although note has been proof read prior to signing, occasional typographical errors still can be missed. If any questions arise, please do not hesitate to call for verification.   electronically signed by:  Felix Pacini, DO  [facility] [place]

## 2022-12-26 NOTE — Patient Instructions (Addendum)
Return in about 24 weeks (around 06/12/2023) for Routine chronic condition follow-up.  Stop amlodipine Start trazodone before bed.  Continue Wellbutrin and atenolol     Great to see you today.  I have refilled the medication(s) we provide.   If labs were collected or images ordered, we will inform you of  results once we have received them and reviewed. We will contact you either by echart message, or telephone call.  Please give ample time to the testing facility, and our office to run,  receive and review results. Please do not call inquiring of results, even if you can see them in your chart. We will contact you as soon as we are able. If it has been over 1 week since the test was completed, and you have not yet heard from [place], then please call us.    - echart message- for normal results that have been seen by the patient already.   - telephone call: abnormal results or if patient has not viewed results in their echart.  If a referral to a specialist was entered for you, please call us in 2 weeks if you have not heard from the specialist office to schedule.

## 2023-01-09 ENCOUNTER — Other Ambulatory Visit (HOSPITAL_BASED_OUTPATIENT_CLINIC_OR_DEPARTMENT_OTHER): Payer: Self-pay

## 2023-01-09 ENCOUNTER — Encounter: Payer: Self-pay | Admitting: Family Medicine

## 2023-01-20 ENCOUNTER — Encounter: Payer: Self-pay | Admitting: Urgent Care

## 2023-01-20 ENCOUNTER — Ambulatory Visit (INDEPENDENT_AMBULATORY_CARE_PROVIDER_SITE_OTHER): Payer: 59 | Admitting: Urgent Care

## 2023-01-20 VITALS — BP 128/86 | HR 75 | Temp 98.1°F | Wt 154.0 lb

## 2023-01-20 DIAGNOSIS — N941 Unspecified dyspareunia: Secondary | ICD-10-CM

## 2023-01-20 DIAGNOSIS — R300 Dysuria: Secondary | ICD-10-CM | POA: Diagnosis not present

## 2023-01-20 DIAGNOSIS — M25551 Pain in right hip: Secondary | ICD-10-CM | POA: Diagnosis not present

## 2023-01-20 DIAGNOSIS — F909 Attention-deficit hyperactivity disorder, unspecified type: Secondary | ICD-10-CM | POA: Diagnosis not present

## 2023-01-20 LAB — POC URINALSYSI DIPSTICK (AUTOMATED)
Bilirubin, UA: NEGATIVE
Glucose, UA: NEGATIVE
Ketones, UA: NEGATIVE
Nitrite, UA: NEGATIVE
Protein, UA: POSITIVE — AB
Spec Grav, UA: 1.01 (ref 1.010–1.025)
Urobilinogen, UA: 0.2 U/dL
pH, UA: 7.5 (ref 5.0–8.0)

## 2023-01-20 MED ORDER — NITROFURANTOIN MONOHYD MACRO 100 MG PO CAPS: 100.0000 mg | ORAL_CAPSULE | Freq: Two times a day (BID) | ORAL | 0 refills | Status: AC

## 2023-01-20 MED ORDER — MELOXICAM 15 MG PO TABS: 15.0000 mg | ORAL_TABLET | Freq: Every day | ORAL | 0 refills | Status: DC

## 2023-01-20 MED ORDER — PHENAZOPYRIDINE HCL 100 MG PO TABS: 100.0000 mg | ORAL_TABLET | Freq: Three times a day (TID) | ORAL | 0 refills | Status: AC | PRN

## 2023-01-20 MED ORDER — GUANFACINE HCL 1 MG PO TABS: 0.5000 mg | ORAL_TABLET | Freq: Every day | ORAL | 0 refills | Status: DC

## 2023-01-20 NOTE — Progress Notes (Unsigned)
Established Patient Office Visit  Subjective:  Patient ID: Rebecca Morrison, female    DOB: 1970-10-05  Age: 52 y.o. MRN: [id number]  Chief Complaint  Patient presents with   Urinary Frequency    Pt reports urinary pain, frequency and has noticed some blood today.    Pleasant 52yo female presents today primarily due to concerns of a possible UTI, but does want to address some additional concerns as well.  #1. Pt states for the past three days she has been having burning with urination and frequency. She noted blood on the toilet paper this morning upon wiping. Does have mild low back pain, but no CVA tenderness, fever or N/V.  #2. Dyspareunia. S/p hysterectomy roughly one year ago. No libido due to this, was previously very active so this has caused some issues. Cannot take estrogens due to her breast cancer.  #3. Mood changes. Pt states she is frequently irritated or gets annoyed easily. Stopped taking wellbutrin as this was worsening her anxiety and causing her to have tremors. She also has ADHD, but could not tolerate the adderalls side effects. At the time she took it, it was inducing hypertension. She does admit that gabapenin and trazodone taken at night have helped extensively with her insomnia, but she still has some nights where she wakes up multiple times.   #4. Hip pain. Taking turmeric shots, ibuprofen, gabapentin. Has an injection scheduled. Pt feels that her pain level is also contributing to her mood.  Urinary Frequency  Associated symptoms include frequency.    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   Postmenopausal 10/31/2020   Acquired absence of both breasts 10/09/2020   Genetic testing 07/24/2020   Family history of kidney cancer    Family history of colon cancer    Family history of ovarian cancer    Family history of breast cancer    Family history of leukemia    Invasive ductal carcinoma of breast, 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   Malignant neoplasm of lower-outer quadrant of left breast of female, 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/13/2020   Vitamin D deficiency 01/19/2018   Vitamin B12 deficiency 01/19/2018   Seasonal allergies 07/02/2015   Encounter for long-term (current) use of medications 07/02/2015   Obesity (BMI 30-39.9) 07/02/2015   GERD (gastroesophageal reflux disease) 07/02/2015   Past Medical History:  Diagnosis Date   Acute appendicitis 03/25/2020   Allergy    Anemia    pt states iron deficient   Back pain 03/05/2018   Cancer (HCC)    left breast invasive ductal   Chronic pain of both knees 01/10/2019   Cyst of right breast 06/19/2014   US/mamm, confirmed cyst 9mm   Depression with anxiety 01/18/2018   Family history of breast cancer    Family history of colon cancer    Family history of kidney cancer    Family history of leukemia    Family history of ovarian cancer    Fatty liver    Gallbladder polyp    Gallstone    GERD (gastroesophageal reflux disease)    Hiatal  hernia    3/1//2016 Dr. Dorene Grebe   Hip pain 01/12/2019   Hypertension    Internal hemorrhoids    Menorrhagia    Murmur    Patient states echocardiogram was normal   Reflux esophagitis    Tinnitus    ENT evaluated   Social History   Tobacco Use   Smoking status: Former   Smokeless tobacco: Never  Vaping Use   Vaping status: Never Used  Substance Use Topics   Alcohol use: Yes    Comment: 3-4 a night   Drug use: No      ROS: as noted in HPI  Objective:     BP 128/86   Pulse 75   Temp 98.1 F  (36.7 C) (Oral)   Wt 154 lb (69.9 kg)   LMP 06/08/2019   SpO2 98%   BMI 28.17 kg/m  BP Readings from Last 3 Encounters:  01/20/23 128/86  12/26/22 97/62  05/06/22 124/89   Wt Readings from Last 3 Encounters:  01/20/23 154 lb (69.9 kg)  12/26/22 148 lb (67.1 kg)  05/06/22 185 lb 12.8 oz (84.3 kg)      Physical Exam Vitals and nursing note reviewed.  Constitutional:      General: She is not in acute distress.    Appearance: Normal appearance. She is not ill-appearing, toxic-appearing or diaphoretic.  HENT:     Head: Normocephalic and atraumatic.  Eyes:     General:        Right eye: No discharge.        Left eye: No discharge.     Extraocular Movements: Extraocular movements intact.     Pupils: Pupils are equal, round, and reactive to light.  Cardiovascular:     Rate and Rhythm: Normal rate.  Pulmonary:     Effort: Pulmonary effort is normal. No respiratory distress.  Abdominal:     General: Abdomen is flat. There is no distension.     Palpations: Abdomen is soft.     Tenderness: There is no abdominal tenderness. There is no right CVA tenderness, left CVA tenderness, guarding or rebound.  Skin:    General: Skin is warm and dry.     Coloration: Skin is not jaundiced.     Findings: No bruising, erythema or rash.  Neurological:     Mental Status: She is alert and oriented to person, place, and time.      Results for orders placed or performed in visit on 01/20/23  POCT Urinalysis Dipstick (Automated)  Result Value Ref Range   Color, UA yellow    Clarity, UA cloudy    Glucose, UA Negative Negative   Bilirubin, UA Negative    Ketones, UA Negative    Spec Grav, UA 1.010 1.010 - 1.025   Blood, UA 3+    pH, UA 7.5 5.0 - 8.0   Protein, UA Positive (A) Negative   Urobilinogen, UA 0.2 0.2 or 1.0 E.U./dL   Nitrite, UA negative    Leukocytes, UA Trace (A) Negative    Last CBC Lab Results  Component Value Date   WBC 4.0 07/18/2020   HGB 12.2 07/18/2020   HCT  36.9 07/18/2020   MCV 90.0 07/18/2020   MCH 29.8 07/18/2020   RDW 13.2 07/18/2020   PLT 159 07/18/2020   Last metabolic panel Lab Results  Component Value Date   GLUCOSE 104 (H) 07/18/2020   NA 139 07/18/2020   K 4.3 07/18/2020   CL 103 07/18/2020   CO2 28 07/18/2020  BUN 9 07/18/2020   CREATININE 0.82 07/18/2020   GFRNONAA >60 07/18/2020   CALCIUM 9.7 07/18/2020   PROT 7.4 07/18/2020   ALBUMIN 4.2 07/18/2020   BILITOT 0.6 07/18/2020   ALKPHOS 65 07/18/2020   AST 24 07/18/2020   ALT 25 07/18/2020   ANIONGAP 8 07/18/2020   Last hemoglobin A1c Lab Results  Component Value Date   HGBA1C 5.0 03/31/2019   Last thyroid functions Lab Results  Component Value Date   TSH 1.72 12/26/2022      The ASCVD Risk score (Arnett DK, et al., 2019) failed to calculate for the following reasons:   Cannot find a previous HDL lab  Assessment & Plan:  Burning with urination -     POCT Urinalysis Dipstick (Automated) -     Urine Culture -     Nitrofurantoin Monohyd Macro; Take 1 capsule (100 mg total) by mouth 2 (two) times daily for 5 days.  Dispense: 10 capsule; Refill: 0 -     Phenazopyridine HCl; Take 1 tablet (100 mg total) by mouth 3 (three) times daily as needed for up to 2 days for pain.  Dispense: 6 tablet; Refill: 0  Current UA in office positive for blood and protein. Pt with hx of UTIs and risk factors for such. Will initiate abx while awaiting culture, if negative will stop abx. Differential dx includes atrophic vaginitis, urinary retention, etc.  Dyspareunia, female  S/p hysterectomy. Unfortunately pt cannot take any hormonal medications due to history of estrogen-receptor positive breast cancer. Recommended she discuss therapies with gyne, question if topical testosterone cream would be beneficial.   Pain of right hip -     Meloxicam; Take 1 tablet (15 mg total) by mouth daily.  Dispense: 30 tablet; Refill: 0  Pt has an MRI scheduled in the near future. States that  nightly gabapentin is helping her sleep. Is currently taking ibuprofen 600-800mg ; will switch to meloxicam for GI protection and to see if more efficacious.   Attention deficit hyperactivity disorder (ADHD), unspecified ADHD type -     guanFACINE HCl; Take 0.5 tablets (0.5 mg total) by mouth at bedtime.  Dispense: 15 tablet; Refill: 0  Pt intolerant to stimulants. Causes racing heart and elevated blood pressure. Wellbutrin was stopped due to ADRs as well. She is bothered by her inability to focus and feels this is contributing to her moodiness. Will do trial of guanfacine- start with 1/2 tab at bed, consider increase to 1mg  daily pending tolerance.    Return in about 4 weeks (around 02/17/2023).   Maretta Bees, PA

## 2023-01-20 NOTE — Patient Instructions (Addendum)
Start taking 1/2 tab guanfacine nightly to see if this helps with your concentration.  Stop taking ibuprofen and switch to meloxicam. Take this once daily with food.  Start taking the macrobid twice daily until gone. Take the pyridium as needed for pain with urination, up to three times daily x 2 days. This will turn your pee orange.  Please schedule a one month follow up to address your medication changes today.

## 2023-01-21 LAB — URINE CULTURE
MICRO NUMBER:: 15782107
SPECIMEN QUALITY:: ADEQUATE

## 2023-01-28 ENCOUNTER — Encounter: Payer: Self-pay | Admitting: Orthopaedic Surgery

## 2023-01-28 ENCOUNTER — Ambulatory Visit: Payer: 59 | Admitting: Orthopaedic Surgery

## 2023-02-03 ENCOUNTER — Ambulatory Visit (INDEPENDENT_AMBULATORY_CARE_PROVIDER_SITE_OTHER): Payer: 59 | Admitting: Physician Assistant

## 2023-02-03 DIAGNOSIS — M25552 Pain in left hip: Secondary | ICD-10-CM | POA: Diagnosis not present

## 2023-02-03 DIAGNOSIS — M25551 Pain in right hip: Secondary | ICD-10-CM

## 2023-02-03 MED ORDER — TRAMADOL HCL 50 MG PO TABS
50.0000 mg | ORAL_TABLET | Freq: Two times a day (BID) | ORAL | 2 refills | Status: DC | PRN
Start: 2023-02-03 — End: 2023-12-01

## 2023-02-03 NOTE — Progress Notes (Signed)
Office Visit Note   Patient: Rebecca Morrison           Date of Birth: 06-27-1970           MRN: [id number] Visit Date: 02/03/2023              Requested by: Natalia Leatherwood, DO [address] [name],  [place] [address] PCP: Natalia Leatherwood, DO   Assessment & Plan: Visit Diagnoses:  1. Bilateral hip pain     Plan: Impression is bilateral hip osteoarthritis.  We have discussed various treatment options to include repeat cortisone injections for which she is agreeable to.  Referrals been made to Dr. Shon Baton for these.  Follow-up as needed.  Follow-Up Instructions: Return if symptoms worsen or fail to improve.   Orders:  No orders of the defined types were placed in this encounter.  Meds ordered this encounter  Medications   traMADol (ULTRAM) 50 MG tablet    Sig: Take 1 tablet (50 mg total) by mouth every 12 (twelve) hours as needed.    Dispense:  30 tablet    Refill:  2      Procedures: No procedures performed   Clinical Data: No additional findings.   Subjective: Chief Complaint  Patient presents with   Left Hip - Pain   Right Hip - Pain    HPI patient is a pleasant 52 year old female who comes in today with bilateral hip pain.  History of underlying osteoarthritis.  Pain returned back in the spring.  She denies any injury but notes that she had lost a fair amount of weight on drives up a time and is unsure whether this is related.  The pain she has is to the groin and is worse when she is bending over as well as when she is ascending stairs.  She does note weakness as well.  She has been taking Mobic without relief.  She has undergone intra-articular cortisone injections back in 2020 and again in 2021 with good relief.  Review of Systems as detailed in HPI.  All others reviewed and are negative.   Objective: Vital Signs: LMP 06/08/2019   Physical Exam well-developed and well-nourished female in no acute distress.  Alert and oriented x 3.  Ortho Exam bilateral hip  exam reveals pain with logroll and FADIR testing.  No pain with [place]inchfield.  She is neurovascularly intact distally.  Specialty Comments:  No specialty comments available.  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   Postmenopausal 10/31/2020   Acquired absence of both breasts 10/09/2020   Genetic testing 07/24/2020   Family history of kidney cancer    Family history of colon cancer    Family history of ovarian cancer    Family history of breast cancer    Family history of leukemia    Invasive ductal carcinoma of breast, 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   Malignant neoplasm of lower-outer quadrant of left breast of female, 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/13/2020   Vitamin D deficiency 01/19/2018   Vitamin B12 deficiency  01/19/2018   Seasonal allergies 07/02/2015   Encounter for long-term (current) use of medications 07/02/2015   Obesity (BMI 30-39.9) 07/02/2015   GERD (gastroesophageal reflux disease) 07/02/2015   Past Medical History:  Diagnosis Date   Acute appendicitis 03/25/2020   Allergy    Anemia    pt states iron deficient   Back pain 03/05/2018   Cancer (HCC)    left breast invasive ductal   Chronic pain of both knees 01/10/2019   Cyst of right breast 06/19/2014   US/mamm, confirmed cyst 9mm   Depression with anxiety 01/18/2018   Family history of breast cancer    Family history of colon cancer    Family history of kidney cancer    Family history of leukemia    Family history of ovarian  cancer    Fatty liver    Gallbladder polyp    Gallstone    GERD (gastroesophageal reflux disease)    Hiatal hernia    3/1//2016 Dr. Dorene Grebe   Hip pain 01/12/2019   Hypertension    Internal hemorrhoids    Menorrhagia    Murmur    Patient states echocardiogram was normal   Reflux esophagitis    Tinnitus    ENT evaluated    Family History  Problem Relation Age of Onset   Arthritis Mother    Osteoporosis Mother        "severe"   Kidney cancer Sister 35   Colon cancer Maternal Grandmother 96   Ovarian cancer Maternal Grandmother 58   Bone cancer Maternal Grandfather 57   Heart disease Maternal Grandfather    Breast cancer Paternal Grandmother        dx >50   Rheum arthritis Maternal Great-grandmother    Leukemia Maternal Great-grandfather 45    Past Surgical History:  Procedure Laterality Date   AUGMENTATION MAMMAPLASTY  2022   done after mastectomy   BREAST REDUCTION SURGERY Bilateral 02/2019   COLONOSCOPY  07/10/2011   rectal bleeding, FHX. Normal colonoscopy.    ESOPHAGOGASTRODUODENOSCOPY  03/27/2013   hiatal hernia/GERD   LEEP     MASTECTOMY  08/2020   bilateral   ROBOTIC ASSISTED TOTAL HYSTERECTOMY  2023   Social History   Occupational History   Not on file  Tobacco Use   Smoking status: Former   Smokeless tobacco: Never  Vaping Use   Vaping status: Never Used  Substance and Sexual Activity   Alcohol use: Yes    Comment: 3-4 a night   Drug use: No   Sexual activity: Yes    Partners: Male    Birth control/protection: Other-see comments    Comment: husband-vasectomy

## 2023-02-03 NOTE — Addendum Note (Signed)
Addended by: Wendi Maya on: 02/03/2023 10:45 AM   Modules accepted: Orders

## 2023-02-03 NOTE — Addendum Note (Signed)
Addended by: Cristie Hem on: 02/03/2023 10:47 AM   Modules accepted: Orders

## 2023-02-09 ENCOUNTER — Encounter: Payer: Self-pay | Admitting: Sports Medicine

## 2023-02-09 ENCOUNTER — Ambulatory Visit (INDEPENDENT_AMBULATORY_CARE_PROVIDER_SITE_OTHER): Payer: 59 | Admitting: Sports Medicine

## 2023-02-09 ENCOUNTER — Other Ambulatory Visit: Payer: Self-pay

## 2023-02-09 ENCOUNTER — Other Ambulatory Visit (HOSPITAL_BASED_OUTPATIENT_CLINIC_OR_DEPARTMENT_OTHER): Payer: Self-pay

## 2023-02-09 DIAGNOSIS — M25552 Pain in left hip: Secondary | ICD-10-CM | POA: Diagnosis not present

## 2023-02-09 DIAGNOSIS — M25551 Pain in right hip: Secondary | ICD-10-CM | POA: Diagnosis not present

## 2023-02-09 DIAGNOSIS — R29898 Other symptoms and signs involving the musculoskeletal system: Secondary | ICD-10-CM | POA: Diagnosis not present

## 2023-02-09 MED ORDER — LIDOCAINE HCL 1 % IJ SOLN
4.0000 mL | INTRAMUSCULAR | Status: AC | PRN
  Administered 2023-02-09: 4 mL

## 2023-02-09 MED ORDER — METHYLPREDNISOLONE ACETATE 40 MG/ML IJ SUSP
40.0000 mg | INTRAMUSCULAR | Status: AC | PRN
  Administered 2023-02-09: 40 mg via INTRA_ARTICULAR

## 2023-02-09 NOTE — Progress Notes (Signed)
Office & Procedure Note  Patient: Rebecca Morrison             Date of Birth: [birth year]           MRN: [id number]             Visit Date: 02/09/2023  HPI: Rebecca Morrison is a pleasant 52 year old female who presents with bilateral hip pain. 4 years ago or so she did have injections into the hips which gave her good relief until recently.  She has been doing a lot more standing at her job. Pain is most localized into bilateral groin. She is interested in formalized physical therapy, but has not started this yet. Takes Meloxicam 15mg  as needed.  PE: - Bilateral hips: No redness swelling or effusion.  There is pain and mild limitation with internal logroll bilaterally, full range of motion with external logroll.  Positive FADIR test.  Visit Diagnoses:  1. Bilateral hip pain   2. Hip weakness    Procedures:  Large Joint Inj: L hip joint on 02/09/2023 9:13 AM Indications: pain Details: 22 G 3.5 in needle, ultrasound-guided anterior approach Medications: 4 mL lidocaine 1 %; 40 mg methylPREDNISolone acetate 40 MG/ML Outcome: tolerated well, no immediate complications  Procedure: US-guided intra-articular hip injection, Left After discussion on risks/benefits/indications and informed verbal consent was obtained, a timeout was performed. Patient was lying supine on exam table. The hip was cleaned with betadine and alcohol swabs. Then utilizing ultrasound guidance, the patient's femoral head and neck junction was identified and subsequently injected with 4:1 lidocaine:depomedrol via an in-plane approach with ultrasound visualization of the injectate administered into the hip joint. Patient tolerated procedure well without immediate complications.  Procedure, treatment alternatives, risks and benefits explained, specific risks discussed. Consent was given by the patient. Immediately prior to procedure a time out was called to verify the correct patient, procedure, equipment, support staff and site/side marked  as required. Patient was prepped and draped in the usual sterile fashion.    Large Joint Inj: R hip joint on 02/09/2023 9:14 AM Indications: pain Details: 22 G 3.5 in needle, ultrasound-guided anterior approach Medications: 4 mL lidocaine 1 %; 40 mg methylPREDNISolone acetate 40 MG/ML Outcome: tolerated well, no immediate complications  Procedure: US-guided intra-articular hip injection, right After discussion on risks/benefits/indications and informed verbal consent was obtained, a timeout was performed. Patient was lying supine on exam table. The hip was cleaned with betadine and alcohol swabs. Then utilizing ultrasound guidance, the patient's femoral head and neck junction was identified and subsequently injected with 4:1 lidocaine:depomedrol via an in-plane approach with ultrasound visualization of the injectate administered into the hip joint. Patient tolerated procedure well without immediate complications.  Procedure, treatment alternatives, risks and benefits explained, specific risks discussed. Consent was given by the patient. Immediately prior to procedure a time out was called to verify the correct patient, procedure, equipment, support staff and site/side marked as required. Patient was prepped and draped in the usual sterile fashion.     Plan:  -Through shared decision-making, we did proceed with ultrasound-guided bilateral intra-articular hip injections, patient tolerated well.  She did have good resolution of her pain even minutes following the anesthetic portion -Discussed postinjection protocol, she may use ice/heat or Tylenol for breakthrough pain -She did have a referral sent for formalized physical therapy but this is still in process.  I did give her a customized handout for hip strengthening and stability given her lateral abduction weakness.  She may start these starting on Wednesday  and perform these once daily until she is able to get started in formal PT. -follow-up  with Mikey Kirschner as indicated; I am happy to see her as needed.  Madelyn Brunner, DO Primary Care Sports Medicine Physician  [facility] - Orthopedics  This note was dictated using Dragon naturally speaking software and may contain errors in syntax, spelling, or content which have not been identified prior to signing this note.

## 2023-02-12 ENCOUNTER — Ambulatory Visit: Payer: 59

## 2023-02-19 ENCOUNTER — Other Ambulatory Visit (HOSPITAL_BASED_OUTPATIENT_CLINIC_OR_DEPARTMENT_OTHER): Payer: Self-pay

## 2023-02-27 ENCOUNTER — Ambulatory Visit: Payer: 59 | Attending: Physician Assistant | Admitting: Physical Therapy

## 2023-02-27 ENCOUNTER — Encounter: Payer: Self-pay | Admitting: Urgent Care

## 2023-02-27 ENCOUNTER — Ambulatory Visit (INDEPENDENT_AMBULATORY_CARE_PROVIDER_SITE_OTHER): Payer: 59 | Admitting: Urgent Care

## 2023-02-27 ENCOUNTER — Ambulatory Visit: Payer: 59 | Admitting: Family Medicine

## 2023-02-27 ENCOUNTER — Encounter: Payer: Self-pay | Admitting: Physical Therapy

## 2023-02-27 ENCOUNTER — Other Ambulatory Visit: Payer: Self-pay

## 2023-02-27 VITALS — BP 131/88 | HR 70 | Ht 62.0 in | Wt 151.1 lb

## 2023-02-27 DIAGNOSIS — G479 Sleep disorder, unspecified: Secondary | ICD-10-CM | POA: Diagnosis not present

## 2023-02-27 DIAGNOSIS — M25551 Pain in right hip: Secondary | ICD-10-CM

## 2023-02-27 DIAGNOSIS — M6281 Muscle weakness (generalized): Secondary | ICD-10-CM | POA: Diagnosis present

## 2023-02-27 DIAGNOSIS — R41840 Attention and concentration deficit: Secondary | ICD-10-CM | POA: Diagnosis not present

## 2023-02-27 DIAGNOSIS — R4189 Other symptoms and signs involving cognitive functions and awareness: Secondary | ICD-10-CM

## 2023-02-27 DIAGNOSIS — M25552 Pain in left hip: Secondary | ICD-10-CM | POA: Insufficient documentation

## 2023-02-27 DIAGNOSIS — R262 Difficulty in walking, not elsewhere classified: Secondary | ICD-10-CM

## 2023-02-27 DIAGNOSIS — F419 Anxiety disorder, unspecified: Secondary | ICD-10-CM

## 2023-02-27 DIAGNOSIS — L989 Disorder of the skin and subcutaneous tissue, unspecified: Secondary | ICD-10-CM

## 2023-02-27 MED ORDER — MELOXICAM 15 MG PO TABS: 15.0000 mg | ORAL_TABLET | Freq: Every day | ORAL | 3 refills | Status: DC

## 2023-02-27 MED ORDER — ONDANSETRON 4 MG PO TBDP: 4.0000 mg | ORAL_TABLET | Freq: Three times a day (TID) | ORAL | 0 refills | Status: DC | PRN

## 2023-02-27 NOTE — Progress Notes (Signed)
 Established Patient Office Visit  Subjective:  Patient ID: Rebecca Morrison, female    DOB: 1970/05/03  Age: 53 y.o. MRN: [id number]  Chief Complaint  Patient presents with   Follow-up    Sleep trouble, staying and falling asleep      Discussed the use of AI software for clinical note transcription with the patient who gave verbal consent to proceed.  History of Present Illness The patient, with a history of ADHD, anxiety, and sleep disturbances, presents for a follow-up visit. She reports having stopped taking guanfacine , a medication previously prescribed for ADHD, due to confusion about its compatibility with trazodone , which she has been taking for sleep issues. The patient has been experiencing difficulty with focus and word recall, describing her thoughts as jumbled up and likening her mental state to a word search. She has been taking trazodone  regularly for the past three weeks.  In addition to her mental health concerns, the patient has been managing pain with either meloxicam  or tramadol , depending on the severity of the pain. She reports a significant improvement in her condition following recent injections into the labral area of her hips, which were previously causing her significant discomfort. She is currently undergoing physical therapy for this issue.  The patient also reports having cysts on her shoulder and various skin tags that she wishes to have removed. She had requested a referral to a dermatologist for these concerns, which was place on 12/26/22.  The patient's urinary symptoms, which were a concern during her last visit, have resolved. She has been taking Exemestane, a medication for breast cancer, along with her pain and sleep medications. She also reports occasional nausea when taking tramadol  and is requesting zofran  refill, which has been effective in the past.     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   Postmenopausal 10/31/2020   Acquired absence of both breasts 10/09/2020   Genetic testing 07/24/2020   Family history of kidney cancer    Family history of colon cancer    Family history of ovarian cancer    Family history of breast cancer    Family history of leukemia    Invasive ductal carcinoma of breast, 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   Malignant neoplasm of lower-outer quadrant of left breast of female, 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/13/2020   Vitamin D  deficiency 01/19/2018   Vitamin B12 deficiency 01/19/2018   Seasonal allergies 07/02/2015   Encounter for long-term (current) use of medications 07/02/2015   Obesity (BMI 30-39.9) 07/02/2015   GERD (gastroesophageal reflux disease) 07/02/2015   Past Medical History:  Diagnosis Date   Acute appendicitis 03/25/2020   Allergy    Anemia    pt states iron  deficient   Back pain 03/05/2018   Cancer (HCC)    left breast invasive ductal   Chronic pain of both knees 01/10/2019   Cyst of right breast 06/19/2014   US /mamm, confirmed cyst 9mm   Depression with anxiety 01/18/2018   Family history of breast  cancer    Family history of colon cancer    Family history of kidney cancer    Family history of leukemia    Family history of ovarian cancer    Fatty liver    Gallbladder polyp    Gallstone    GERD (gastroesophageal reflux disease)    Hiatal hernia    3/1//2016 Dr. Junior   Hip pain 01/12/2019   Hypertension    Internal hemorrhoids    Menorrhagia     Murmur    Patient states echocardiogram was normal   Reflux esophagitis    Tinnitus    ENT evaluated   Past Surgical History:  Procedure Laterality Date   AUGMENTATION MAMMAPLASTY  2022   done after mastectomy   BREAST REDUCTION SURGERY Bilateral 02/2019   COLONOSCOPY  07/10/2011   rectal bleeding, FHX. Normal colonoscopy.    ESOPHAGOGASTRODUODENOSCOPY  03/27/2013   hiatal hernia/GERD   LEEP     MASTECTOMY  08/2020   bilateral   ROBOTIC ASSISTED TOTAL HYSTERECTOMY  2023   Social History   Tobacco Use   Smoking status: Former   Smokeless tobacco: Never  Vaping Use   Vaping status: Never Used  Substance Use Topics   Alcohol use: Yes    Comment: 3-4 a night   Drug use: No      ROS: as noted in HPI  Objective:     BP 131/88 (BP Location: Right Arm, Patient Position: Sitting)   Pulse 70   Ht 5' 2 (1.575 m)   Wt 151 lb 1.6 oz (68.5 kg)   LMP 06/08/2019   SpO2 99%   BMI 27.64 kg/m  BP Readings from Last 3 Encounters:  02/27/23 131/88  01/20/23 128/86  12/26/22 97/62   Wt Readings from Last 3 Encounters:  02/27/23 151 lb 1.6 oz (68.5 kg)  01/20/23 154 lb (69.9 kg)  12/26/22 148 lb (67.1 kg)      Physical Exam Vitals and nursing note reviewed.  Constitutional:      General: She is not in acute distress.    Appearance: Normal appearance. She is not ill-appearing, toxic-appearing or diaphoretic.  HENT:     Head: Normocephalic and atraumatic.  Eyes:     General: No scleral icterus.       Right eye: No discharge.        Left eye: No discharge.     Extraocular Movements: Extraocular movements intact.     Pupils: Pupils are equal, round, and reactive to light.  Cardiovascular:     Rate and Rhythm: Normal rate.  Pulmonary:     Effort: Pulmonary effort is normal. No respiratory distress.  Skin:    General: Skin is warm and dry.     Coloration: Skin is not jaundiced.     Findings: No bruising, erythema or rash.  Neurological:     General: No focal  deficit present.     Mental Status: She is alert and oriented to person, place, and time.     Gait: Gait normal.     Comments: Difficulty with word finding at times, speech otherwise clear  Psychiatric:        Mood and Affect: Mood normal.        Behavior: Behavior normal.      No results found for any visits on 02/27/23.  Last CBC Lab Results  Component Value Date   WBC 4.0 07/18/2020   HGB 12.2 07/18/2020   HCT 36.9 07/18/2020   MCV 90.0 07/18/2020  MCH 29.8 07/18/2020   RDW 13.2 07/18/2020   PLT 159 07/18/2020   Last metabolic panel Lab Results  Component Value Date   GLUCOSE 104 (H) 07/18/2020   NA 139 07/18/2020   K 4.3 07/18/2020   CL 103 07/18/2020   CO2 28 07/18/2020   BUN 9 07/18/2020   CREATININE 0.82 07/18/2020   GFRNONAA >60 07/18/2020   CALCIUM 9.7 07/18/2020   PROT 7.4 07/18/2020   ALBUMIN 4.2 07/18/2020   BILITOT 0.6 07/18/2020   ALKPHOS 65 07/18/2020   AST 24 07/18/2020   ALT 25 07/18/2020   ANIONGAP 8 07/18/2020      The ASCVD Risk score (Arnett DK, et al., 2019) failed to calculate for the following reasons:   Cannot find a previous HDL lab  Assessment & Plan:  Cognitive changes Assessment & Plan: Memory Concerns Unfortunately, this could be linked to her aromatase inhibitor.  Patient reports difficulty with word finding. No prior brain MRI. Will do trial of Rx for ADD sx. -Consider cognitive screening (MMSE) and potential brain MRI at next visit if no improvement with guanfacine .   Pain of right hip Assessment & Plan: Pt now following with ortho. Just received intraarticular injections, doing well after this. Pain managed with either Mobic  or Tramadol , depending on severity. Pt averaging 3-4 tramadol  a week. Patient reports nausea with Tramadol  use. -Continue current pain management strategy. -Prescribe Zofran  4mg  sublingual as needed for nausea. -continue PT as ordered by ortho, consider MRI hip if sx persist/ worsen  Orders: -      Meloxicam ; Take 1 tablet (15 mg total) by mouth daily.  Dispense: 30 tablet; Refill: 3 -     Ondansetron ; Take 1 tablet (4 mg total) by mouth every 8 (eight) hours as needed for nausea or vomiting.  Dispense: 20 tablet; Refill: 0  Lack of concentration  Sleep disorder Assessment & Plan: May continue trazodone  nightly for sleep. Take 1/2 tab guanfacine  at night as well as this may also make you tired   Anxiousness Assessment & Plan: ADHD and Anxiety Patient reports difficulty with focus and feeling jumbled up. Previously prescribed guanfacine  but has not been taking it. Did not tolerate adderall as it cause worsening hypertension -Resume guanfacine , half tablet at night. -Schedule follow-up in six weeks to assess response to guanfacine .   Skin lesion Assessment & Plan: Patient reports needing dermatology referral for mole removal and skin tags. Was placed on 12/26/22. -Advise patient to contact dermatology office to schedule appointment, information provided.      Return in about 6 weeks (around 04/10/2023).   Benton LITTIE Gave, PA

## 2023-02-27 NOTE — Therapy (Signed)
 OUTPATIENT PHYSICAL THERAPY LOWER EXTREMITY EVALUATION   Patient Name: Rebecca Morrison MRN: [id number] DOB:[birth year], 53 y.o., female Today's Date: 02/27/2023  END OF SESSION:  PT End of Session - 02/27/23 1144     Visit Number 1    Number of Visits 16    Date for PT Re-Evaluation 04/24/23    Authorization Type [employer]    PT Start Time 0858    PT Stop Time 0930    PT Time Calculation (min) 32 min    Activity Tolerance Patient tolerated treatment well    Behavior During Therapy [facility] for tasks assessed/performed             Past Medical History:  Diagnosis Date   Acute appendicitis 03/25/2020   Allergy    Anemia    pt states iron  deficient   Back pain 03/05/2018   Cancer (HCC)    left breast invasive ductal   Chronic pain of both knees 01/10/2019   Cyst of right breast 06/19/2014   US /mamm, confirmed cyst 9mm   Depression with anxiety 01/18/2018   Family history of breast cancer    Family history of colon cancer    Family history of kidney cancer    Family history of leukemia    Family history of ovarian cancer    Fatty liver    Gallbladder polyp    Gallstone    GERD (gastroesophageal reflux disease)    Hiatal hernia    3/1//2016 Dr. Junior   Hip pain 01/12/2019   Hypertension    Internal hemorrhoids    Menorrhagia    Murmur    Patient states echocardiogram was normal   Reflux esophagitis    Tinnitus    ENT evaluated   Past Surgical History:  Procedure Laterality Date   AUGMENTATION MAMMAPLASTY  2022   done after mastectomy   BREAST REDUCTION SURGERY Bilateral 02/2019   COLONOSCOPY  07/10/2011   rectal bleeding, FHX. Normal colonoscopy.    ESOPHAGOGASTRODUODENOSCOPY  03/27/2013   hiatal hernia/GERD   LEEP     MASTECTOMY  08/2020   bilateral   ROBOTIC ASSISTED TOTAL HYSTERECTOMY  2023   Patient Active Problem List   Diagnosis Date Noted   Osteopenia 04/08/2022   Rectocele 11/19/2021   Urinary, incontinence, stress female 11/19/2021   Primary  hypertension 08/20/2021   Palpitations 08/20/2021   Mal de mer 08/20/2021   Sleep disorder 08/20/2021   Anxiousness 08/20/2021   Aromatase inhibitor use 06/12/2021   Use of leuprolide acetate (Lupron) 06/12/2021   Encounter for monitoring aromatase inhibitor therapy 01/04/2021   Suppression of ovarian secretion 01/04/2021   Postmenopausal 10/31/2020   Acquired absence of both breasts 10/09/2020   Genetic testing 07/24/2020   Family history of kidney cancer    Family history of colon cancer    Family history of ovarian cancer    Family history of breast cancer    Family history of leukemia    Invasive ductal carcinoma of breast, 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   Malignant neoplasm of lower-outer quadrant of left breast of female, 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/13/2020   Vitamin D  deficiency 01/19/2018   Vitamin B12 deficiency 01/19/2018   Seasonal allergies 07/02/2015   Encounter for long-term (current) use of medications 07/02/2015   Obesity (BMI 30-39.9) 07/02/2015   GERD (gastroesophageal reflux disease) 07/02/2015    PCP: Catherine Fuller  REFERRING PROVIDER: Jule Shuck  REFERRING DIAG: bilateral hip pain  THERAPY DIAG:  Pain of both hip joints  Muscle weakness (generalized)  Difficulty in walking, not elsewhere classified  Rationale for Evaluation and Treatment: Rehabilitation  ONSET DATE: 01/2023 (MD appt)  SUBJECTIVE:   SUBJECTIVE STATEMENT: Pt was having worsening hip pain in bilat hips for the past few months. She tried weight loss and got a new mattress but nothing helped. She got injections in both hips 2 weeks ago which have helped but she still has pain in her glute area. She has increased pain with bending forward, antalgic gait, pain decreases with meds. 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 Yes: NPRS scale: 2/10 Lt glute and bilat groin,  7/10 at worst Pain location: Lt glute, groin Pain description: ache, sore Aggravating factors: bending Relieving factors: meds  PRECAUTIONS: Other: breast cancer  RED FLAGS: None   WEIGHT BEARING RESTRICTIONS: No  FALLS:  Has patient fallen in last 6 months? No   OCCUPATION: works at an occupational hygienist  PLOF: Independent  PATIENT GOALS: decrease pain, get stronger  NEXT MD VISIT: PRN  OBJECTIVE:  Note: Objective measures were completed at Evaluation unless otherwise noted.   MUSCLE LENGTH: Hamstrings: decreased bilat   POSTURE: weight shift right  PALPATION: TTP Lt glute, piriformis, ischial tub Hypomobile Lt hip distraction and lateral glide  LOWER EXTREMITY ROM:  Active ROM Right eval Left eval  Hip flexion 120 90 pain  Hip extension    Hip abduction    Hip adduction    Hip internal rotation To neutral To neutral  Hip external rotation [facility]  Knee flexion    Knee extension    Ankle dorsiflexion    Ankle plantarflexion    Ankle inversion    Ankle eversion     (Blank rows = not tested)  LOWER EXTREMITY MMT:  MMT Right eval Left eval  Hip flexion 4 4-  Hip extension 4- 3+  Hip abduction 4 3 pain  Hip adduction    Hip internal rotation    Hip external rotation    Knee flexion    Knee extension    Ankle dorsiflexion    Ankle plantarflexion    Ankle inversion    Ankle eversion     (Blank rows = not tested)   FUNCTIONAL TESTS:  5 times sit to stand: 8.36 Pain with SLS on Lt, no pain on Rt  GAIT:  Comments: trendelenburg on Lt                                                                                                                                TREATMENT DATE: 02/27/23 See HEP    PATIENT EDUCATION:  Education details: PT POC and goals, HEP Person educated: Patient Education method: Explanation, Demonstration, and Handouts Education comprehension: verbalized understanding and returned demonstration  HOME EXERCISE  PROGRAM: Access Code: [email or web address] URL: [email or web address] Date: 02/27/2023 Prepared by: Darice Conine  Exercises -  Supine Bridge with Mini Swiss Ball Between Knees  - 1 x daily - 7 x weekly - 3 sets - 10 reps - Hooklying Clamshell with Resistance  - 1 x daily - 7 x weekly - 3 sets - 10 reps - Sidelying Hip Abduction  - 1 x daily - 7 x weekly - 3 sets - 10 reps - Squat with Chair Touch  - 1 x daily - 7 x weekly - 3 sets - 10 reps  ASSESSMENT:  CLINICAL IMPRESSION: Patient is a 53 y.o. female who was seen today for physical therapy evaluation and treatment for bilateral hip pain. She presents with hip pain Lt > Rt, decreased strength, impaired gait and mobility, decreased activity tolerance. She will benefit from skilled PT to address deficits and improve functional mobility.   OBJECTIVE IMPAIRMENTS: Abnormal gait, decreased activity tolerance, decreased balance, decreased ROM, decreased strength, increased muscle spasms, and pain.   ACTIVITY LIMITATIONS: bending and climbing  PARTICIPATION LIMITATIONS: community activity and occupation  PERSONAL FACTORS: Time since onset of injury/illness/exacerbation are also affecting patient's functional outcome.   REHAB POTENTIAL: Good  CLINICAL DECISION MAKING: Stable/uncomplicated  EVALUATION COMPLEXITY: Low   GOALS: Goals reviewed with patient? Yes  SHORT TERM GOALS: Target date: 03/27/2023   Pt will be independent in initial HEP Baseline: Goal status: INITIAL  2.  Pt will improve Lt LE strength to 4/5 Baseline:  Goal status: INITIAL    LONG TERM GOALS: Target date: 04/24/2023    Pt will be independent with advanced HEP for use at home gym Baseline:  Goal status: INITIAL  2.  Pt will improve bilat LE strength to 4+/5 Baseline:  Goal status: INITIAL  3.  Pt will tolerate bending forward to pick up object from floor with pain <= 1/10 Baseline:  Goal status: INITIAL  4.  Pt will walk household distances  without trendelenburg on Lt Baseline:  Goal status: INITIAL     PLAN:  PT FREQUENCY: 1-2x/week  PT DURATION: 8 weeks  PLANNED INTERVENTIONS: 97164- PT Re-evaluation, 97110-Therapeutic exercises, 97530- Therapeutic activity, 97112- Neuromuscular re-education, 97535- Self Care, [id number]- Manual therapy, [id number]- Aquatic Therapy, 97014- Electrical stimulation (unattended), 97035- Ultrasound, Patient/Family education, Balance training, Taping, Dry Needling, Cryotherapy, and Moist heat  PLAN FOR NEXT SESSION: assess response to HEP, hip mobility, decreased strength   Kamal Jurgens, PT 02/27/2023, 11:45 AM

## 2023-02-27 NOTE — Patient Instructions (Addendum)
 You were referred to [facility] health dermatology on 12/26/22. Please call them today to schedule your office visit.  [address], [place], [place] [address] Phone: [phone]  Please start taking your guanfacine  daily. Take 1/2 tab every night. I would like to see if this helps with your cognition.  Please follow up in 5-6 weeks for recheck.  Call Dr. Lonell Sprang after you complete physical therapy to determine if MRI of hips is needed. Take meloxicam  daily. Only take Tramadol  for severe pain due to addictive nature of the medication and side effects of constipation.  Take 1 tab zofran  under tongue as needed for nausea associated with tramadol  use.

## 2023-02-28 DIAGNOSIS — R4189 Other symptoms and signs involving cognitive functions and awareness: Secondary | ICD-10-CM | POA: Insufficient documentation

## 2023-02-28 DIAGNOSIS — M25551 Pain in right hip: Secondary | ICD-10-CM | POA: Insufficient documentation

## 2023-02-28 DIAGNOSIS — L989 Disorder of the skin and subcutaneous tissue, unspecified: Secondary | ICD-10-CM | POA: Insufficient documentation

## 2023-02-28 NOTE — Assessment & Plan Note (Signed)
 May continue trazodone nightly for sleep. Take 1/2 tab guanfacine at night as well as this may also make you tired

## 2023-02-28 NOTE — Assessment & Plan Note (Signed)
 Pt now following with ortho. Just received intraarticular injections, doing well after this. Pain managed with either Mobic  or Tramadol , depending on severity. Pt averaging 3-4 tramadol  a week. Patient reports nausea with Tramadol  use. -Continue current pain management strategy. -Prescribe Zofran  4mg  sublingual as needed for nausea. -continue PT as ordered by ortho, consider MRI hip if sx persist/ worsen

## 2023-02-28 NOTE — Assessment & Plan Note (Signed)
 Patient reports needing dermatology referral for mole removal and skin tags. Was placed on 12/26/22. -Advise patient to contact dermatology office to schedule appointment, information provided.

## 2023-02-28 NOTE — Assessment & Plan Note (Signed)
 ADHD and Anxiety Patient reports difficulty with focus and feeling jumbled up. Previously prescribed guanfacine  but has not been taking it. Did not tolerate adderall as it cause worsening hypertension -Resume guanfacine , half tablet at night. -Schedule follow-up in six weeks to assess response to guanfacine .

## 2023-02-28 NOTE — Assessment & Plan Note (Signed)
 Memory Concerns Unfortunately, this could be linked to her aromatase inhibitor.  Patient reports difficulty with word finding. No prior brain MRI. Will do trial of Rx for ADD sx. -Consider cognitive screening (MMSE) and potential brain MRI at next visit if no improvement with guanfacine .

## 2023-03-06 ENCOUNTER — Ambulatory Visit: Payer: 59 | Admitting: Physical Therapy

## 2023-03-06 ENCOUNTER — Encounter: Payer: Self-pay | Admitting: Physical Therapy

## 2023-03-06 DIAGNOSIS — R262 Difficulty in walking, not elsewhere classified: Secondary | ICD-10-CM

## 2023-03-06 DIAGNOSIS — M25551 Pain in right hip: Secondary | ICD-10-CM

## 2023-03-06 DIAGNOSIS — M6281 Muscle weakness (generalized): Secondary | ICD-10-CM

## 2023-03-06 NOTE — Therapy (Addendum)
 OUTPATIENT PHYSICAL THERAPY LOWER EXTREMITY TREATMENT AND DISCHARGE   Patient Name: Rebecca Morrison MRN: [id number] DOB:Dec 24, 1970, 53 y.o., female Today's Date: 03/06/2023  END OF SESSION:  PT End of Session - 03/06/23 1010     Visit Number 2    Number of Visits 16    Date for PT Re-Evaluation 04/24/23    Authorization Type [employer]    PT Start Time 0930    PT Stop Time 1012    PT Time Calculation (min) 42 min    Activity Tolerance Patient tolerated treatment well    Behavior During Therapy [facility] for tasks assessed/performed              Past Medical History:  Diagnosis Date   Acute appendicitis 03/25/2020   Allergy    Anemia    pt states iron  deficient   Back pain 03/05/2018   Cancer (HCC)    left breast invasive ductal   Chronic pain of both knees 01/10/2019   Cyst of right breast 06/19/2014   US /mamm, confirmed cyst 9mm   Depression with anxiety 01/18/2018   Family history of breast cancer    Family history of colon cancer    Family history of kidney cancer    Family history of leukemia    Family history of ovarian cancer    Fatty liver    Gallbladder polyp    Gallstone    GERD (gastroesophageal reflux disease)    Hiatal hernia    3/1//2016 Dr. Junior   Hip pain 01/12/2019   Hypertension    Internal hemorrhoids    Menorrhagia    Murmur    Patient states echocardiogram was normal   Reflux esophagitis    Tinnitus    ENT evaluated   Past Surgical History:  Procedure Laterality Date   AUGMENTATION MAMMAPLASTY  2022   done after mastectomy   BREAST REDUCTION SURGERY Bilateral 02/2019   COLONOSCOPY  07/10/2011   rectal bleeding, FHX. Normal colonoscopy.    ESOPHAGOGASTRODUODENOSCOPY  03/27/2013   hiatal hernia/GERD   LEEP     MASTECTOMY  08/2020   bilateral   ROBOTIC ASSISTED TOTAL HYSTERECTOMY  2023   Patient Active Problem List   Diagnosis Date Noted   Cognitive changes 02/28/2023   Pain of right hip 02/28/2023   Skin lesion 02/28/2023    Osteopenia 04/08/2022   Rectocele 11/19/2021   Urinary, incontinence, stress female 11/19/2021   Primary hypertension 08/20/2021   Palpitations 08/20/2021   [place] 08/20/2021   Sleep disorder 08/20/2021   Anxiousness 08/20/2021   Aromatase inhibitor use 06/12/2021   Use of leuprolide acetate (Lupron) 06/12/2021   Encounter for monitoring aromatase inhibitor therapy 01/04/2021   Suppression of ovarian secretion 01/04/2021   Postmenopausal 10/31/2020   Acquired absence of both breasts 10/09/2020   Genetic testing 07/24/2020   Family history of kidney cancer    Family history of colon cancer    Family history of ovarian cancer    Family history of breast cancer    Family history of leukemia    Invasive ductal carcinoma of breast, 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   Malignant neoplasm of lower-outer quadrant of left breast of female, 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/13/2020   Vitamin D  deficiency 01/19/2018   Vitamin B12 deficiency 01/19/2018   Seasonal allergies 07/02/2015   Encounter for long-term (current) use of medications 07/02/2015   Obesity (BMI 30-39.9) 07/02/2015   GERD (gastroesophageal reflux disease) 07/02/2015    PCP: Catherine Fuller  REFERRING PROVIDER: Jule Shuck  REFERRING DIAG: bilateral hip pain  THERAPY DIAG:  Pain of both hip joints  Muscle weakness (generalized)  Difficulty in walking, not elsewhere classified  Rationale for Evaluation and Treatment: Rehabilitation  ONSET DATE: 01/2023 (MD appt)  SUBJECTIVE:   SUBJECTIVE STATEMENT: Pt reports her glutes are still sore  PERTINENT HISTORY: Breast cancer  Pt was having worsening hip pain in bilat hips for the past few months. She tried weight loss and got a new mattress but nothing helped. She got injections in both hips 2 weeks ago which have helped but she still has pain in her glute area. She has increased pain with bending forward, antalgic gait, pain decreases with meds. 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 Yes: NPRS scale: 3/10 Lt glute and bilat groin, 7/10 at worst Pain location: Lt glute, groin Pain description: ache, sore Aggravating factors: bending Relieving factors: meds  PRECAUTIONS: Other: breast cancer  RED FLAGS: None   WEIGHT BEARING RESTRICTIONS: No  FALLS:  Has patient fallen in last 6 months? No   OCCUPATION: works at an occupational hygienist  PLOF: Independent  PATIENT GOALS: decrease pain, get stronger  NEXT MD VISIT: PRN  OBJECTIVE:  Note: Objective measures were completed at Evaluation unless otherwise noted.   MUSCLE LENGTH: Hamstrings: decreased bilat   POSTURE: weight shift right  PALPATION: TTP Lt glute, piriformis, ischial tub Hypomobile Lt hip distraction and lateral glide  LOWER EXTREMITY ROM:  Active ROM Right eval Left eval  Hip flexion 120 90 pain  Hip extension    Hip abduction    Hip adduction    Hip internal rotation To neutral To neutral  Hip external rotation [facility]  Knee flexion    Knee extension    Ankle dorsiflexion    Ankle plantarflexion    Ankle inversion    Ankle eversion     (Blank rows = not tested)  LOWER EXTREMITY MMT:  MMT Right eval Left eval  Hip flexion 4 4-  Hip extension 4- 3+  Hip abduction 4 3 pain  Hip adduction    Hip internal rotation    Hip external rotation    Knee flexion    Knee extension    Ankle dorsiflexion    Ankle plantarflexion    Ankle inversion    Ankle eversion     (Blank rows = not tested)   FUNCTIONAL TESTS:  5 times sit to stand: 8.36 Pain with SLS on Lt, no pain on Rt  GAIT:  Comments: trendelenburg on Lt   [facility] Adult PT Treatment:                                                DATE: 03/06/23 Therapeutic Exercise: Nustep L5 x 5 min LEs only Bridge with ball squeeze 2 x 10 Hooklying clam green TB 2 x 10 Sidelying hip abd 2 x 10 Sidelying hip circles 2 x 10 Squat  tap 2 x 10 Side step green TB 10' x 4 Bent over hip ext knee bent 2 x 10 Captain morgans x 10 bilat  TREATMENT DATE: 02/27/23 See HEP    PATIENT EDUCATION:  Education details: PT POC and goals, HEP Person educated: Patient Education method: Explanation, Demonstration, and Handouts Education comprehension: verbalized understanding and returned demonstration  HOME EXERCISE PROGRAM: Access Code: [email or web address] URL: [email or web address] Date: 02/27/2023 Prepared by: Darice Conine  Exercises - Supine Bridge with [employer] Between Knees  - 1 x daily - 7 x weekly - 3 sets - 10 reps - Hooklying Clamshell with Resistance  - 1 x daily - 7 x weekly - 3 sets - 10 reps - Sidelying Hip Abduction  - 1 x daily - 7 x weekly - 3 sets - 10 reps - Squat with Chair Touch  - 1 x daily - 7 x weekly - 3 sets - 10 reps  ASSESSMENT:  CLINICAL IMPRESSION: Pt with good tolerance to activities today. Some discomfort with sidelying in hip that is on table - transitioned to more standing exercises  OBJECTIVE IMPAIRMENTS: Abnormal gait, decreased activity tolerance, decreased balance, decreased ROM, decreased strength, increased muscle spasms, and pain.    GOALS: Goals reviewed with patient? Yes  SHORT TERM GOALS: Target date: 03/27/2023   Pt will be independent in initial HEP Baseline: Goal status: INITIAL  2.  Pt will improve Lt LE strength to 4/5 Baseline:  Goal status: INITIAL    LONG TERM GOALS: Target date: 04/24/2023    Pt will be independent with advanced HEP for use at home gym Baseline:  Goal status: INITIAL  2.  Pt will improve bilat LE strength to 4+/5 Baseline:  Goal status: INITIAL  3.  Pt will tolerate bending forward to pick up object from floor with pain <= 1/10 Baseline:  Goal status: INITIAL  4.  Pt will walk household  distances without trendelenburg on Lt Baseline:  Goal status: INITIAL     PLAN:  PT FREQUENCY: 1-2x/week  PT DURATION: 8 weeks  PLANNED INTERVENTIONS: 97164- PT Re-evaluation, 97110-Therapeutic exercises, 97530- Therapeutic activity, 97112- Neuromuscular re-education, 97535- Self Care, 02859- Manual therapy, [id number]- Aquatic Therapy, 97014- Electrical stimulation (unattended), 97035- Ultrasound, Patient/Family education, Balance training, Taping, Dry Needling, Cryotherapy, and Moist heat  PLAN FOR NEXT SESSION:  glute and hip strength and endurance  PHYSICAL THERAPY DISCHARGE SUMMARY  Visits from Start of Care: 2  Current functional level related to goals / functional outcomes: See above   Remaining deficits: See above   Education / Equipment: HEP   Patient agrees to discharge. Patient goals were not met. Patient is being discharged due to not returning since the last visit. Darice Conine, PT,DPT03/14/259:55 AM   Loeta Herst, PT 03/06/2023, 10:10 AM

## 2023-03-12 ENCOUNTER — Ambulatory Visit: Payer: 59

## 2023-03-13 ENCOUNTER — Other Ambulatory Visit (HOSPITAL_BASED_OUTPATIENT_CLINIC_OR_DEPARTMENT_OTHER): Payer: Self-pay

## 2023-03-13 MED ORDER — TIRZEPATIDE-WEIGHT MANAGEMENT 10 MG/0.5ML ~~LOC~~ SOAJ
10.0000 mg | SUBCUTANEOUS | 1 refills | Status: DC
  Filled 2023-03-13: qty 2, 28d supply, fill #0
  Filled 2023-04-12: qty 2, 28d supply, fill #1

## 2023-04-06 ENCOUNTER — Telehealth: Payer: Self-pay | Admitting: Family Medicine

## 2023-04-06 NOTE — Telephone Encounter (Unsigned)
 Copied from CRM [id number]. Topic: Clinical - Medication Refill >> Apr 06, 2023  1:24 PM Maryln Sober wrote: Most Recent Primary Care Visit:  Provider: CRAIN, WHITNEY L  Department: [facility]  Visit Type: OFFICE VISIT  Date: 02/27/2023  Medication: guanFACINE  (TENEX ) 1 MG tablet  Has the patient contacted their pharmacy?  (Agent: If no, request that the patient contact the pharmacy for the refill. If patient does not wish to contact the pharmacy document the reason why and proceed with request.) (Agent: If yes, when and what did the pharmacy advise?)  Is this the correct pharmacy for this prescription? Yes If no, delete pharmacy and type the correct one.  This is the patient's preferred pharmacy:  [id number] [place], [place] - [address] [place] [address] Phone: [phone] Fax: [phone]  [facility] [address] [place] [address] Phone: [phone] Fax: [phone]   Has the prescription been filled recently? No  Is the patient out of the medication? Yes  Has the patient been seen for an appointment in the last year OR does the patient have an upcoming appointment? Yes  Can we respond through MyChart? Yes  Agent: Please be advised that Rx refills may take up to 3 business days. We ask that you follow-up with your pharmacy.

## 2023-04-06 NOTE — Telephone Encounter (Signed)
 Pt has follow up with Corita Diego 2/14.

## 2023-04-09 ENCOUNTER — Encounter: Payer: Self-pay | Admitting: Family Medicine

## 2023-04-09 ENCOUNTER — Ambulatory Visit: Payer: 59 | Admitting: Family Medicine

## 2023-04-09 VITALS — BP 134/84 | HR 86 | Temp 98.6°F | Wt 152.0 lb

## 2023-04-09 DIAGNOSIS — G479 Sleep disorder, unspecified: Secondary | ICD-10-CM

## 2023-04-09 DIAGNOSIS — I10 Essential (primary) hypertension: Secondary | ICD-10-CM | POA: Diagnosis not present

## 2023-04-09 DIAGNOSIS — M25551 Pain in right hip: Secondary | ICD-10-CM | POA: Diagnosis not present

## 2023-04-09 DIAGNOSIS — F909 Attention-deficit hyperactivity disorder, unspecified type: Secondary | ICD-10-CM | POA: Diagnosis not present

## 2023-04-09 DIAGNOSIS — R4189 Other symptoms and signs involving cognitive functions and awareness: Secondary | ICD-10-CM

## 2023-04-09 DIAGNOSIS — F419 Anxiety disorder, unspecified: Secondary | ICD-10-CM

## 2023-04-09 DIAGNOSIS — R002 Palpitations: Secondary | ICD-10-CM

## 2023-04-09 MED ORDER — MUPIROCIN 2 % EX OINT: 1.0000 | TOPICAL_OINTMENT | Freq: Two times a day (BID) | CUTANEOUS | 0 refills | Status: DC

## 2023-04-09 MED ORDER — DOXYCYCLINE HYCLATE 100 MG PO TABS: 100.0000 mg | ORAL_TABLET | Freq: Two times a day (BID) | ORAL | 0 refills | Status: AC

## 2023-04-09 MED ORDER — GUANFACINE HCL 1 MG PO TABS
0.5000 mg | ORAL_TABLET | Freq: Every day | ORAL | 1 refills | Status: DC
Start: 2023-04-09 — End: 2023-06-23

## 2023-04-09 NOTE — Progress Notes (Signed)
Rebecca Morrison , Mar 22, 1970, 53 y.o., female MRN: [id number] Patient Care Team    Relationship Specialty Notifications Start End  Natalia Leatherwood, DO PCP - General Family Medicine  09/03/15   Rinaldo Ratel, MD Referring Physician Gastroenterology  03/31/19   Westley Foots, MD Referring Physician Plastic Surgery  03/31/19   Pershing Proud, RN Oncology Nurse Navigator   07/13/20   Donnelly Angelica, RN Oncology Nurse Navigator   07/13/20   Griselda Miner, MD Consulting Physician General Surgery  07/16/20   Serena Croissant, MD Consulting Physician Hematology and Oncology  07/16/20   Dorothy Puffer, MD Consulting Physician Radiation Oncology  07/16/20   [facility] Consulting Physician Oncology  05/22/21   Ronny Bacon, MD Referring Physician Oncology  06/12/21   Genia Del, MD Consulting Physician Obstetrics and Gynecology  06/12/21     Chief Complaint  Patient presents with   Anxiety     Subjective: Rebecca Morrison  is a 54 y.o. female presents to office for Chronic Conditions/illness Management'  Sleep disorder/anxiety/ADHD: Patient reports compliance with trazodone 50 mg at bedtime prn-occasionally taking. She dc'd wellbutrin. Another provider started gaunfacine 0.5 mg at bedtime.  She feels she still having decreased focus.  She feels the Wellbutrin has helped a great deal with her cravings/binging.  Hypertension/palpitations: No longer taking atenolol also.  Amlodipine dc'd last visit.  Patient denies chest pain, shortness of breath, dizziness or lower extremity edema.   Prior note: Pt presents for an OV with complaints of elevated BP readings and tachycardia since starting her Lupron injections and aromatase tx for invasive breast cancer .Associated symptoms include weight gain and stress Pt dx with invasive carcinoma of left breast, has undergone bilateral mastectomy/reconstruction and is receiving 3.75 mg Depot Lupron injections through [facility].  She is also prescribed aromatase. Her BP has been elevated since the start of the injections and aromatase. Reviewed last OV note at onc, she had a BP of 151/100.   Cbc, cmp reviewed in EMR from 02/2021> normal/stable for patient.   Epidural cyst x2 left posterior shoulder. Larger cyst is draining.      04/09/2023   10:52 AM 01/20/2023    1:16 PM 12/26/2022    9:47 AM 03/04/2022    2:40 PM 06/12/2021    8:40 AM  Depression screen PHQ 2/9  Decreased Interest 1 0 1 0 3  Down, Depressed, Hopeless 1 0 0 0 2  PHQ - 2 Score 2 0 1 0 5  Altered sleeping 3 2 3  2   Tired, decreased energy 2 1 1  3   Change in appetite 0 0 0  1  Feeling bad or failure about yourself  0 0 0  1  Trouble concentrating 3 2 2  1   Moving slowly or fidgety/restless 0 1 0  2  Suicidal thoughts 0 0 0  0  PHQ-9 Score 10 6 7  15   Difficult doing work/chores Somewhat difficult Somewhat difficult Somewhat difficult      No Known Allergies Social History   Social History Narrative   Married, husband Publishing copy. 2 children [name] in [place].   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   Former smoker, quit 2003.   Social alcohol use, no drug use.   Drinks caffeinated beverages, uses herbal remedies, takes a daily vitamin.   Wears her seatbelt, wears a bicycle helmet, exercises at least 3 times a week.   Smoke detector  in the home, feels safe in her relationships.   Past Medical History:  Diagnosis Date   Acute appendicitis 03/25/2020   Allergy    Anemia    pt states iron deficient   Back pain 03/05/2018   Cancer (HCC)    left breast invasive ductal   Chronic pain of both knees 01/10/2019   Cyst of right breast 06/19/2014   US/mamm, confirmed cyst 9mm   Depression with anxiety 01/18/2018   Family history of breast cancer    Family history of colon cancer    Family history of kidney cancer    Family history of leukemia    Family history of ovarian cancer    Fatty liver     Gallbladder polyp    Gallstone    GERD (gastroesophageal reflux disease)    Hiatal hernia    3/1//2016 Dr. Dorene Grebe   Hip pain 01/12/2019   Hypertension    Internal hemorrhoids    Menorrhagia    Murmur    Patient states echocardiogram was normal   Reflux esophagitis    Tinnitus    ENT evaluated   Past Surgical History:  Procedure Laterality Date   AUGMENTATION MAMMAPLASTY  2022   done after mastectomy   BREAST REDUCTION SURGERY Bilateral 02/2019   COLONOSCOPY  07/10/2011   rectal bleeding, FHX. Normal colonoscopy.    ESOPHAGOGASTRODUODENOSCOPY  03/27/2013   hiatal hernia/GERD   LEEP     MASTECTOMY  08/2020   bilateral   ROBOTIC ASSISTED TOTAL HYSTERECTOMY  2023   Family History  Problem Relation Age of Onset   Arthritis Mother    Osteoporosis Mother        "severe"   Kidney cancer Sister 72   Colon cancer Maternal Grandmother 36   Ovarian cancer Maternal Grandmother 54   Bone cancer Maternal Grandfather 57   Heart disease Maternal Grandfather    Breast cancer Paternal Grandmother        dx >50   Rheum arthritis Maternal Great-grandmother    Leukemia Maternal Great-grandfather 22   Allergies as of 04/09/2023   No Known Allergies      Medication List        Accurate as of April 09, 2023 11:20 AM. If you have any questions, ask your nurse or doctor.          STOP taking these medications    ondansetron 4 MG disintegrating tablet Commonly known as: ZOFRAN-ODT Stopped by: Felix Pacini       TAKE these medications    CALCIUM PO Take by mouth. Stron bone   doxycycline 100 MG tablet Commonly known as: VIBRA-TABS Take 1 tablet (100 mg total) by mouth 2 (two) times daily for 7 days. Started by: Felix Pacini   exemestane 25 MG tablet Commonly known as: AROMASIN Take 25 mg by mouth daily.   guanFACINE 1 MG tablet Commonly known as: TENEX Take 0.5 tablets (0.5 mg total) by mouth at bedtime.   meloxicam 15 MG tablet Commonly known as:  MOBIC Take 1 tablet (15 mg total) by mouth daily.   mupirocin ointment 2 % Commonly known as: BACTROBAN Apply 1 Application topically 2 (two) times daily. Started by: Felix Pacini   traMADol 50 MG tablet Commonly known as: ULTRAM Take 1 tablet (50 mg total) by mouth every 12 (twelve) hours as needed.   traZODone 50 MG tablet Commonly known as: DESYREL Take 1 tablet (50 mg total) by mouth at bedtime as needed for sleep.   Zepbound 10  MG/0.5ML Pen Generic drug: tirzepatide Inject 10 mg into the skin once a week.        All past medical history, surgical history, allergies, family history, immunizations andmedications were updated in the EMR today and reviewed under the history and medication portions of their EMR.     ROS Negative, with the exception of above mentioned in HPI  Objective:  BP 134/84   Pulse 86   Temp 98.6 F (37 C)   Wt 152 lb (68.9 kg)   LMP 06/08/2019   SpO2 99%   BMI 27.80 kg/m  Body mass index is 27.8 kg/m. Physical Exam Vitals and nursing note reviewed.  Constitutional:      General: She is not in acute distress.    Appearance: Normal appearance. She is not ill-appearing, toxic-appearing or diaphoretic.  HENT:     Head: Normocephalic and atraumatic.  Eyes:     General: No scleral icterus.       Right eye: No discharge.        Left eye: No discharge.     Extraocular Movements: Extraocular movements intact.     Conjunctiva/sclera: Conjunctivae normal.     Pupils: Pupils are equal, round, and reactive to light.  Cardiovascular:     Rate and Rhythm: Normal rate and regular rhythm.     Heart sounds: No murmur heard. Pulmonary:     Effort: Pulmonary effort is normal.  Musculoskeletal:     Right lower leg: No edema.     Left lower leg: No edema.  Skin:    General: Skin is warm.     Findings: Lesion (x2 epidural cyst post shoulder - larger of two is draining a small amount prulent fluid. no erythema.) present. No rash.  Neurological:      Mental Status: She is alert and oriented to person, place, and time. Mental status is at baseline.     Motor: No weakness.     Gait: Gait normal.  Psychiatric:        Mood and Affect: Mood normal.        Behavior: Behavior normal.        Thought Content: Thought content normal.        Judgment: Judgment normal.     No results found. No results found. No results found for this or any previous visit (from the past 24 hours).  Assessment/Plan: Rebecca Morrison is a 53 y.o. female present for OV for Chronic Conditions/illness Management Sleep disorder/anxiety: Stable continue trazodone 50 mg nightly  Memory changes Patient has noticed decrease in attention over the last year.  She has a history of vitamin B-12 and vitamin D deficiency, and not currently taking supplements. Now prescribed Aromasin for breast CA history- which may be contributing  - TSH> WNL 12/2022 - B12 and Folate Panel>WNL 12/2022 - Vitamin D (25 hydroxy)> WNL 12/2022 - another provider started guanfacine 1 mg tab (1/2 tab at bedtime)  Ok to schedule procedure visit 40 min for epidural cyst removal.  Reviewed expectations re: course of current medical issues. Discussed self-management of symptoms. Outlined signs and symptoms indicating need for more acute intervention. Patient verbalized understanding and all questions were answered. Patient received an After-Visit Summary.    No orders of the defined types were placed in this encounter.  Meds ordered this encounter  Medications   guanFACINE (TENEX) 1 MG tablet    Sig: Take 0.5 tablets (0.5 mg total) by mouth at bedtime.    Dispense:  45 tablet  Refill:  1   doxycycline (VIBRA-TABS) 100 MG tablet    Sig: Take 1 tablet (100 mg total) by mouth 2 (two) times daily for 7 days.    Dispense:  14 tablet    Refill:  0   mupirocin ointment (BACTROBAN) 2 %    Sig: Apply 1 Application topically 2 (two) times daily.    Dispense:  22 g    Refill:  0   Referral  Orders  No referral(s) requested today      Note is dictated utilizing voice recognition software. Although note has been proof read prior to signing, occasional typographical errors still can be missed. If any questions arise, please do not hesitate to call for verification.   electronically signed by:  Felix Pacini, DO  [facility] [place]

## 2023-04-09 NOTE — Patient Instructions (Signed)
Return for procedure (40 min)- epidural cyst removal x2.        Great to see you today.  I have refilled the medication(s) we provide.   If labs were collected or images ordered, we will inform you of  results once we have received them and reviewed. We will contact you either by echart message, or telephone call.  Please give ample time to the testing facility, and our office to run,  receive and review results. Please do not call inquiring of results, even if you can see them in your chart. We will contact you as soon as we are able. If it has been over 1 week since the test was completed, and you have not yet heard from [place], then please call us.    - echart message- for normal results that have been seen by the patient already.   - telephone call: abnormal results or if patient has not viewed results in their echart.  If a referral to a specialist was entered for you, please call us in 2 weeks if you have not heard from the specialist office to schedule.

## 2023-04-10 ENCOUNTER — Ambulatory Visit: Payer: 59 | Admitting: Urgent Care

## 2023-04-13 ENCOUNTER — Other Ambulatory Visit (HOSPITAL_BASED_OUTPATIENT_CLINIC_OR_DEPARTMENT_OTHER): Payer: Self-pay

## 2023-04-14 ENCOUNTER — Ambulatory Visit: Payer: 59 | Admitting: Family Medicine

## 2023-04-14 ENCOUNTER — Encounter: Payer: Self-pay | Admitting: Family Medicine

## 2023-04-14 ENCOUNTER — Ambulatory Visit (INDEPENDENT_AMBULATORY_CARE_PROVIDER_SITE_OTHER): Payer: 59 | Admitting: Family Medicine

## 2023-04-14 VITALS — BP 134/72 | HR 80 | Temp 97.8°F | Wt 151.0 lb

## 2023-04-14 DIAGNOSIS — L720 Epidermal cyst: Secondary | ICD-10-CM | POA: Diagnosis not present

## 2023-04-14 NOTE — Progress Notes (Unsigned)
error 

## 2023-04-14 NOTE — Progress Notes (Unsigned)
 Rebecca Morrison , 11-Nov-1970, 53 y.o., female MRN: [id number] Patient Care Team    Relationship Specialty Notifications Start End  Natalia Leatherwood, DO PCP - General Family Medicine  09/03/15   Rinaldo Ratel, MD Referring Physician Gastroenterology  03/31/19   Westley Foots, MD Referring Physician Plastic Surgery  03/31/19   Pershing Proud, RN Oncology Nurse Navigator   07/13/20   Donnelly Angelica, RN Oncology Nurse Navigator   07/13/20   Griselda Miner, MD Consulting Physician General Surgery  07/16/20   Serena Croissant, MD Consulting Physician Hematology and Oncology  07/16/20   Dorothy Puffer, MD Consulting Physician Radiation Oncology  07/16/20   [facility] Consulting Physician Oncology  05/22/21   Ronny Bacon, MD Referring Physician Oncology  06/12/21   Genia Del, MD Consulting Physician Obstetrics and Gynecology  06/12/21     Chief Complaint  Patient presents with  . Procedure     Subjective: Rebecca Morrison is a 53 y.o. Pt presents for an OV with complaints of *** of *** duration.  Associated symptoms include ***.  Pt has tried *** to ease their symptoms.      04/09/2023   10:52 AM 01/20/2023    1:16 PM 12/26/2022    9:47 AM 03/04/2022    2:40 PM 06/12/2021    8:40 AM  Depression screen PHQ 2/9  Decreased Interest 1 0 1 0 3  Down, Depressed, Hopeless 1 0 0 0 2  PHQ - 2 Score 2 0 1 0 5  Altered sleeping 3 2 3  2   Tired, decreased energy 2 1 1  3   Change in appetite 0 0 0  1  Feeling bad or failure about yourself  0 0 0  1  Trouble concentrating 3 2 2  1   Moving slowly or fidgety/restless 0 1 0  2  Suicidal thoughts 0 0 0  0  PHQ-9 Score 10 6 7  15   Difficult doing work/chores Somewhat difficult Somewhat difficult Somewhat difficult      No Known Allergies Social History   Social History Narrative   Married, husband Publishing copy. 2 children [name] in [place].   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    Former smoker, quit 2003.   Social alcohol use, no drug use.   Drinks caffeinated beverages, uses herbal remedies, takes a daily vitamin.   Wears her seatbelt, wears a bicycle helmet, exercises at least 3 times a week.   Smoke detector in the home, feels safe in her relationships.   Past Medical History:  Diagnosis Date  . Acute appendicitis 03/25/2020  . Allergy   . Anemia    pt states iron deficient  . Back pain 03/05/2018  . Cancer (HCC)    left breast invasive ductal  . Chronic pain of both knees 01/10/2019  . Cyst of right breast 06/19/2014   US/mamm, confirmed cyst 9mm  . Depression with anxiety 01/18/2018  . Family history of breast cancer   . Family history of colon cancer   . Family history of kidney cancer   . Family history of leukemia   . Family history of ovarian cancer   . Fatty liver   . Gallbladder polyp   . Gallstone   . GERD (gastroesophageal reflux disease)   . Hiatal hernia    3/1//2016 Dr. Dorene Grebe  . Hip pain 01/12/2019  . Hypertension   . Internal hemorrhoids   . Menorrhagia   .  Murmur    Patient states echocardiogram was normal  . Reflux esophagitis   . Tinnitus    ENT evaluated   Past Surgical History:  Procedure Laterality Date  . AUGMENTATION MAMMAPLASTY  2022   done after mastectomy  . BREAST REDUCTION SURGERY Bilateral 02/2019  . COLONOSCOPY  07/10/2011   rectal bleeding, FHX. Normal colonoscopy.   . ESOPHAGOGASTRODUODENOSCOPY  03/27/2013   hiatal hernia/GERD  . LEEP    . MASTECTOMY  08/2020   bilateral  . ROBOTIC ASSISTED TOTAL HYSTERECTOMY  2023   Family History  Problem Relation Age of Onset  . Arthritis Mother   . Osteoporosis Mother        "severe"  . Kidney cancer Sister 28  . Colon cancer Maternal Grandmother 36  . Ovarian cancer Maternal Grandmother 22  . Bone cancer Maternal Grandfather 31  . Heart disease Maternal Grandfather   . Breast cancer Paternal Grandmother        dx >50  . Rheum arthritis Maternal  Great-grandmother   . Leukemia Maternal Great-grandfather 55   Allergies as of 04/14/2023   No Known Allergies      Medication List        Accurate as of April 14, 2023 11:06 AM. If you have any questions, ask your nurse or doctor.          CALCIUM PO Take by mouth. Stron bone   doxycycline 100 MG tablet Commonly known as: VIBRA-TABS Take 1 tablet (100 mg total) by mouth 2 (two) times daily for 7 days.   exemestane 25 MG tablet Commonly known as: AROMASIN Take 25 mg by mouth daily.   guanFACINE 1 MG tablet Commonly known as: TENEX Take 0.5 tablets (0.5 mg total) by mouth at bedtime.   meloxicam 15 MG tablet Commonly known as: MOBIC Take 1 tablet (15 mg total) by mouth daily.   mupirocin ointment 2 % Commonly known as: BACTROBAN Apply 1 Application topically 2 (two) times daily.   traMADol 50 MG tablet Commonly known as: ULTRAM Take 1 tablet (50 mg total) by mouth every 12 (twelve) hours as needed.   traZODone 50 MG tablet Commonly known as: DESYREL Take 1 tablet (50 mg total) by mouth at bedtime as needed for sleep.   Zepbound 10 MG/0.5ML Pen Generic drug: tirzepatide Inject 10 mg into the skin once a week.        All past medical history, surgical history, allergies, family history, immunizations andmedications were updated in the EMR today and reviewed under the history and medication portions of their EMR.     ROS Negative, with the exception of above mentioned in HPI   Objective:  BP 134/72   Pulse 80   Temp 97.8 F (36.6 C)   Wt 151 lb (68.5 kg)   LMP 06/08/2019   SpO2 98%   BMI 27.62 kg/m  Body mass index is 27.62 kg/m.  Physical Exam   No results found. No results found. No results found for this or any previous visit (from the past 24 hours).  Assessment/Plan: Rebecca Morrison is a 53 y.o. female present for OV for  *** Reviewed expectations re: course of current medical issues. Discussed self-management of  symptoms. Outlined signs and symptoms indicating need for more acute intervention. Patient verbalized understanding and all questions were answered. Patient received an After-Visit Summary.    No orders of the defined types were placed in this encounter.  No orders of the defined types were placed in this encounter.  Referral Orders  No referral(s) requested today     Note is dictated utilizing voice recognition software. Although note has been proof read prior to signing, occasional typographical errors still can be missed. If any questions arise, please do not hesitate to call for verification.   electronically signed by:  Felix Pacini, DO  [facility] - [place]

## 2023-04-16 ENCOUNTER — Encounter: Payer: Self-pay | Admitting: Family Medicine

## 2023-04-16 DIAGNOSIS — L720 Epidermal cyst: Secondary | ICD-10-CM | POA: Insufficient documentation

## 2023-04-16 HISTORY — DX: Epidermal cyst: L72.0

## 2023-04-16 NOTE — Patient Instructions (Signed)
 Keep area clean and dry. Allow Steri-Strips to fall off on their own. Monitor for any redness, puslike drainage and follow-up immediately if occurs.

## 2023-06-10 ENCOUNTER — Encounter (HOSPITAL_BASED_OUTPATIENT_CLINIC_OR_DEPARTMENT_OTHER): Payer: Self-pay

## 2023-06-10 ENCOUNTER — Other Ambulatory Visit (HOSPITAL_BASED_OUTPATIENT_CLINIC_OR_DEPARTMENT_OTHER): Payer: Self-pay

## 2023-06-12 ENCOUNTER — Other Ambulatory Visit (HOSPITAL_BASED_OUTPATIENT_CLINIC_OR_DEPARTMENT_OTHER): Payer: Self-pay

## 2023-06-12 MED ORDER — ZEPBOUND 10 MG/0.5ML ~~LOC~~ SOAJ
10.0000 mg | SUBCUTANEOUS | 1 refills | Status: DC
  Filled 2023-06-12 – 2023-06-18 (×2): qty 2, 28d supply, fill #0

## 2023-06-18 ENCOUNTER — Other Ambulatory Visit (HOSPITAL_BASED_OUTPATIENT_CLINIC_OR_DEPARTMENT_OTHER): Payer: Self-pay

## 2023-06-19 ENCOUNTER — Ambulatory Visit: Payer: 59 | Admitting: Family Medicine

## 2023-06-23 ENCOUNTER — Encounter: Payer: Self-pay | Admitting: Family Medicine

## 2023-06-23 ENCOUNTER — Ambulatory Visit (INDEPENDENT_AMBULATORY_CARE_PROVIDER_SITE_OTHER): Admitting: Family Medicine

## 2023-06-23 DIAGNOSIS — F909 Attention-deficit hyperactivity disorder, unspecified type: Secondary | ICD-10-CM | POA: Diagnosis not present

## 2023-06-23 DIAGNOSIS — M25551 Pain in right hip: Secondary | ICD-10-CM

## 2023-06-23 MED ORDER — MELOXICAM 15 MG PO TABS
15.0000 mg | ORAL_TABLET | Freq: Every day | ORAL | 1 refills | Status: DC
Start: 2023-06-23 — End: 2023-12-01

## 2023-06-23 MED ORDER — TRAZODONE HCL 50 MG PO TABS: 50.0000 mg | ORAL_TABLET | Freq: Every evening | ORAL | 1 refills | Status: DC | PRN

## 2023-06-23 MED ORDER — GUANFACINE HCL 1 MG PO TABS: 0.5000 mg | ORAL_TABLET | Freq: Every day | ORAL | 1 refills | Status: DC

## 2023-06-23 NOTE — Patient Instructions (Addendum)
 Return in about 24 weeks (around 12/08/2023) for Routine chronic condition follow-up.        Great to see you today.  I have refilled the medication(s) we provide.   If labs were collected or images ordered, we will inform you of  results once we have received them and reviewed. We will contact you either by echart message, or telephone call.  Please give ample time to the testing facility, and our office to run,  receive and review results. Please do not call inquiring of results, even if you can see them in your chart. We will contact you as soon as we are able. If it has been over 1 week since the test was completed, and you have not yet heard from us , then please call us .    - echart message- for normal results that have been seen by the patient already.   - telephone call: abnormal results or if patient has not viewed results in their echart.  If a referral to a specialist was entered for you, please call us  in 2 weeks if you have not heard from the specialist office to schedule.

## 2023-06-23 NOTE — Progress Notes (Signed)
 Rebecca Morrison , [birth year], 53 y.o., female MRN: [id number] Patient Care Team    Relationship Specialty Notifications Start End  Mariel Shope, DO PCP - General Family Medicine  09/03/15   Hervey Loser, MD Referring Physician Gastroenterology  03/31/19   Odette Benjamin, MD Referring Physician Plastic Surgery  03/31/19   Auther Bo, RN Oncology Nurse Navigator   07/13/20   Alane Hsu, RN Oncology Nurse Navigator   07/13/20   Caralyn Chandler, MD Consulting Physician General Surgery  07/16/20   Cameron Cea, MD Consulting Physician Hematology and Oncology  07/16/20   Johna Myers, MD Consulting Physician Radiation Oncology  07/16/20   [facility] Consulting Physician Oncology  05/22/21   Diana Forster, MD Referring Physician Oncology  06/12/21   Percy Bracken, MD Consulting Physician Obstetrics and Gynecology  06/12/21   Shauna Del, DO Consulting Physician Sports Medicine  06/23/23     Chief Complaint  Patient presents with   ADHD     Subjective: Rebecca Morrison  is a 53 y.o. female presents to office for Chronic Conditions/illness Management'  Sleep disorder/anxiety/ADHD: Patient reports compliance with trazodone  50 mg at bedtime prn-occasionally taking. Feeling well on gaunfacine 0.5 mg at bedtime.      04/09/2023   10:52 AM 01/20/2023    1:16 PM 12/26/2022    9:47 AM 03/04/2022    2:40 PM 06/12/2021    8:40 AM  Depression screen PHQ 2/9  Decreased Interest 1 0 1 0 3  Down, Depressed, Hopeless 1 0 0 0 2  PHQ - 2 Score 2 0 1 0 5  Altered sleeping 3 2 3  2   Tired, decreased energy 2 1 1  3   Change in appetite 0 0 0  1  Feeling bad or failure about yourself  0 0 0  1  Trouble concentrating 3 2 2  1   Moving slowly or fidgety/restless 0 1 0  2  Suicidal thoughts 0 0 0  0  PHQ-9 Score 10 6 7  15   Difficult doing work/chores Somewhat difficult Somewhat difficult Somewhat difficult      No Known Allergies Social History   Social History Narrative    Married, husband Publishing copy. 2 children [name] in [place].   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   Former smoker, quit 2003.   Social alcohol use, no drug use.   Drinks caffeinated beverages, uses herbal remedies, takes a daily vitamin.   Wears her seatbelt, wears a bicycle helmet, exercises at least 3 times a week.   Smoke detector in the home, feels safe in her relationships.   Past Medical History:  Diagnosis Date   Acute appendicitis 03/25/2020   Allergy    Anemia    pt states iron  deficient   Back pain 03/05/2018   Cancer (HCC)    left breast invasive ductal   Chronic pain of both knees 01/10/2019   Cyst of right breast 06/19/2014   US /mamm, confirmed cyst 9mm   Depression with anxiety 01/18/2018   Family history of breast cancer    Family history of colon cancer    Family history of kidney cancer    Family history of leukemia    Family history of ovarian cancer    Fatty liver    Gallbladder polyp    Gallstone    GERD (gastroesophageal reflux disease)    Hiatal hernia    3/1//2016 Dr. Cari Char   Hip pain  01/12/2019   Hypertension    Internal hemorrhoids    Menorrhagia    Murmur    Patient states echocardiogram was normal   Reflux esophagitis    Tinnitus    ENT evaluated   Past Surgical History:  Procedure Laterality Date   AUGMENTATION MAMMAPLASTY  2022   done after mastectomy   BREAST REDUCTION SURGERY Bilateral 02/2019   COLONOSCOPY  07/10/2011   rectal bleeding, FHX. Normal colonoscopy.    ESOPHAGOGASTRODUODENOSCOPY  03/27/2013   hiatal hernia/GERD   LEEP     MASTECTOMY  08/2020   bilateral   ROBOTIC ASSISTED TOTAL HYSTERECTOMY  2023   Family History  Problem Relation Age of Onset   Arthritis Mother    Osteoporosis Mother        "severe"   Kidney cancer Sister 67   Colon cancer Maternal Grandmother 71   Ovarian cancer Maternal Grandmother 38   Bone cancer Maternal Grandfather 57   Heart disease  Maternal Grandfather    Breast cancer Paternal Grandmother        dx >50   Rheum arthritis Maternal Great-grandmother    Leukemia Maternal Great-grandfather 56   Allergies as of 06/23/2023   No Known Allergies      Medication List        Accurate as of June 23, 2023 11:23 AM. If you have any questions, ask your nurse or doctor.          CALCIUM PO Take by mouth. Stron bone   exemestane 25 MG tablet Commonly known as: AROMASIN Take 25 mg by mouth daily.   guanFACINE  1 MG tablet Commonly known as: TENEX  Take 0.5 tablets (0.5 mg total) by mouth at bedtime.   meloxicam  15 MG tablet Commonly known as: MOBIC  Take 1 tablet (15 mg total) by mouth daily.   mupirocin  ointment 2 % Commonly known as: BACTROBAN  Apply 1 Application topically 2 (two) times daily.   traMADol  50 MG tablet Commonly known as: ULTRAM  Take 1 tablet (50 mg total) by mouth every 12 (twelve) hours as needed.   traZODone  50 MG tablet Commonly known as: DESYREL  Take 1 tablet (50 mg total) by mouth at bedtime as needed for sleep.   Zepbound  10 MG/0.5ML Pen Generic drug: tirzepatide  Inject 10 mg into the skin once a week.        All past medical history, surgical history, allergies, family history, immunizations andmedications were updated in the EMR today and reviewed under the history and medication portions of their EMR.     ROS Negative, with the exception of above mentioned in HPI  Objective:  BP 102/72   Pulse 90   Temp (!) 97.5 F (36.4 C)   Wt 156 lb 6.4 oz (70.9 kg)   LMP 06/08/2019   SpO2 98%   BMI 28.61 kg/m  Body mass index is 28.61 kg/m. Physical Exam Vitals and nursing note reviewed.  Constitutional:      General: She is not in acute distress.    Appearance: Normal appearance. She is normal weight. She is not ill-appearing or toxic-appearing.  HENT:     Head: Normocephalic and atraumatic.  Eyes:     General: No scleral icterus.       Right eye: No discharge.         Left eye: No discharge.     Extraocular Movements: Extraocular movements intact.     Conjunctiva/sclera: Conjunctivae normal.     Pupils: Pupils are equal, round, and reactive to light.  Skin:    Findings: No rash.  Neurological:     Mental Status: She is alert and oriented to person, place, and time. Mental status is at baseline.     Motor: No weakness.     Coordination: Coordination normal.     Gait: Gait normal.  Psychiatric:        Mood and Affect: Mood normal.        Behavior: Behavior normal.        Thought Content: Thought content normal.        Judgment: Judgment normal.     No results found. No results found. No results found for this or any previous visit (from the past 24 hours).  Assessment/Plan: Sahasra Lucas is a 53 y.o. female present for OV for Chronic Conditions/illness Management Sleep disorder/anxiety: Stable Continue trazodone  50 mg nightly  Memory changes Patient has noticed decrease in attention over the last year.  She has a history of vitamin B-12 and vitamin D  deficiency, and not currently taking supplements. Now prescribed Aromasin for breast CA history- which may be contributing  - TSH> WNL 12/2022 - B12 and Folate Panel>WNL 12/2022 - Vitamin D  (25 hydroxy)> WNL 12/2022 - continue guanfacine  1 mg tab (1/2 tab at bedtime)   Reviewed expectations re: course of current medical issues. Discussed self-management of symptoms. Outlined signs and symptoms indicating need for more acute intervention. Patient verbalized understanding and all questions were answered. Patient received an After-Visit Summary.   Return in about 24 weeks (around 12/08/2023) for Routine chronic condition follow-up.  No orders of the defined types were placed in this encounter.  Meds ordered this encounter  Medications   traZODone  (DESYREL ) 50 MG tablet    Sig: Take 1 tablet (50 mg total) by mouth at bedtime as needed for sleep.    Dispense:  90 tablet    Refill:  1    guanFACINE  (TENEX ) 1 MG tablet    Sig: Take 0.5 tablets (0.5 mg total) by mouth at bedtime.    Dispense:  45 tablet    Refill:  1   meloxicam  (MOBIC ) 15 MG tablet    Sig: Take 1 tablet (15 mg total) by mouth daily.    Dispense:  90 tablet    Refill:  1   Referral Orders  No referral(s) requested today      Note is dictated utilizing voice recognition software. Although note has been proof read prior to signing, occasional typographical errors still can be missed. If any questions arise, please do not hesitate to call for verification.   electronically signed by:  Napolean Backbone, DO   [facility] - [place]

## 2023-07-14 ENCOUNTER — Other Ambulatory Visit (HOSPITAL_BASED_OUTPATIENT_CLINIC_OR_DEPARTMENT_OTHER): Payer: Self-pay

## 2023-07-14 MED ORDER — ZEPBOUND 12.5 MG/0.5ML ~~LOC~~ SOAJ
12.5000 mg | SUBCUTANEOUS | 1 refills | Status: DC
  Filled 2023-07-14: qty 2, 28d supply, fill #0

## 2023-07-28 ENCOUNTER — Ambulatory Visit (INDEPENDENT_AMBULATORY_CARE_PROVIDER_SITE_OTHER): Payer: 59 | Admitting: Dermatology

## 2023-07-28 ENCOUNTER — Encounter: Payer: Self-pay | Admitting: Dermatology

## 2023-07-28 VITALS — BP 121/80

## 2023-07-28 DIAGNOSIS — L570 Actinic keratosis: Secondary | ICD-10-CM

## 2023-07-28 DIAGNOSIS — D225 Melanocytic nevi of trunk: Secondary | ICD-10-CM | POA: Diagnosis not present

## 2023-07-28 DIAGNOSIS — D229 Melanocytic nevi, unspecified: Secondary | ICD-10-CM

## 2023-07-28 DIAGNOSIS — L821 Other seborrheic keratosis: Secondary | ICD-10-CM

## 2023-07-28 DIAGNOSIS — D492 Neoplasm of unspecified behavior of bone, soft tissue, and skin: Secondary | ICD-10-CM | POA: Diagnosis not present

## 2023-07-28 DIAGNOSIS — W908XXA Exposure to other nonionizing radiation, initial encounter: Secondary | ICD-10-CM | POA: Diagnosis not present

## 2023-07-28 DIAGNOSIS — L219 Seborrheic dermatitis, unspecified: Secondary | ICD-10-CM

## 2023-07-28 DIAGNOSIS — D1801 Hemangioma of skin and subcutaneous tissue: Secondary | ICD-10-CM

## 2023-07-28 DIAGNOSIS — L814 Other melanin hyperpigmentation: Secondary | ICD-10-CM

## 2023-07-28 DIAGNOSIS — L578 Other skin changes due to chronic exposure to nonionizing radiation: Secondary | ICD-10-CM

## 2023-07-28 DIAGNOSIS — Z1283 Encounter for screening for malignant neoplasm of skin: Secondary | ICD-10-CM

## 2023-07-28 DIAGNOSIS — D485 Neoplasm of uncertain behavior of skin: Secondary | ICD-10-CM

## 2023-07-28 DIAGNOSIS — D2261 Melanocytic nevi of right upper limb, including shoulder: Secondary | ICD-10-CM

## 2023-07-28 MED ORDER — CLOBETASOL PROPIONATE 0.05 % EX SOLN: CUTANEOUS | 4 refills | Status: AC

## 2023-07-28 NOTE — Patient Instructions (Addendum)

## 2023-07-28 NOTE — Progress Notes (Signed)
 New Patient Visit   Subjective  Rebecca Morrison is a 53 y.o. female who presents for the following:  Total Body Skin Exam (TBSE)  Patient present today for new patient visit for TBSE.The patient reports she has spots, moles and lesions to be evaluated, some may be new or changing and the patient may have concern these could be cancer. Patient has previously been treated by a dermatologist.Patient reports she has hx of bx (precancerous). Patient denies family history of skin cancers. Patient reports throughout her lifetime has had moderate sun exposure. Currently, patient reports if she has excessive sun exposure, she does apply sunscreen and/or wears protective coverings.  The following portions of the chart were reviewed this encounter and updated as appropriate: medications, allergies, medical history  Review of Systems:  No other skin or systemic complaints except as noted in HPI or Assessment and Plan.  Objective  Well appearing patient in no apparent distress; mood and affect are within normal limits.  A full examination was performed including scalp, head, eyes, ears, nose, lips, neck, chest, axillae, abdomen, back, buttocks, bilateral upper extremities, bilateral lower extremities, hands, feet, fingers, toes, fingernails, and toenails. All findings within normal limits unless otherwise noted below.    Relevant exam findings are noted in the Assessment and Plan.           Mid Forehead, Right Nasal Sidewall Erythematous thin papules/macules with gritty scale.  R Posterior Upper Arm 4.13mm brown irregular macule Right Upper Arm - Anterior 5.32mm brown irregular macule Left Lower Back 54mmx4mm dark brown macule   Assessment & Plan   LENTIGINES, SEBORRHEIC KERATOSES, HEMANGIOMAS - Benign normal skin lesions - Benign-appearing - Call for any changes  MELANOCYTIC NEVI - Tan-brown and/or pink-flesh-colored symmetric macules and papules - Benign appearing on exam today -  Observation - Call clinic for new or changing moles - Recommend daily use of broad spectrum spf 30+ sunscreen to sun-exposed areas.   ACTINIC DAMAGE - Chronic condition, secondary to cumulative UV/sun exposure - diffuse scaly erythematous macules with underlying dyspigmentation - Recommend daily broad spectrum sunscreen SPF 30+ to sun-exposed areas, reapply every 2 hours as needed.  - Staying in the shade or wearing long sleeves, sun glasses (UVA+UVB protection) and wide brim hats (4-inch brim around the entire circumference of the hat) are also recommended for sun protection.  - Call for new or changing lesions.  SEBORRHEIC DERMATITIS Exam: Pink patches with greasy scale at scalp flared - Assessment: Patient reports dry, flaky, and itchy scalp. Diagnosis of seborrheic dermatitis is made based on these symptoms. Contributing factors may include weather changes, stress, hormonal changes, or depression, which can create an environment for yeast overgrowth on the skin and scalp.  - Plan:    Prescribe clobetasol drops for scalp application    Recommend DHS zinc shampoo (2% zinc) for regular use or continue Vani Cream Zinc Shampoo     - Instructions: Apply, let sit for 2 minutes, rinse, then follow with regular shampoo    Patient education:     - Educate on the nature of seborrheic dermatitis and potential triggers  Treatment Plan: - Recommended DHS Zinc shampoo - Rx Clobetasol solution - apply to affected areas of scalp daily PRN    SKIN CANCER SCREENING PERFORMED TODAY AK (ACTINIC KERATOSIS) (2) Mid Forehead, Right Nasal Sidewall Destruction of lesion - Mid Forehead, Right Nasal Sidewall Complexity: simple   Destruction method: cryotherapy   Informed consent: discussed and consent obtained   Timeout:  patient  name, date of birth, surgical site, and procedure verified Lesion destroyed using liquid nitrogen: Yes   Post-procedure details: wound care instructions given   SEBORRHEIC  DERMATITIS   NEOPLASM OF UNCERTAIN BEHAVIOR OF SKIN (3) R Posterior Upper Arm Skin / nail biopsy Type of biopsy: tangential   Informed consent: discussed and consent obtained   Timeout: patient name, date of birth, surgical site, and procedure verified   Procedure prep:  Patient was prepped and draped in usual sterile fashion Prep type:  Isopropyl alcohol Anesthesia: the lesion was anesthetized in a standard fashion   Anesthetic:  1% lidocaine  w/ epinephrine 1-100,000 buffered w/ 8.4% NaHCO3 Instrument used: DermaBlade   Hemostasis achieved with: aluminum chloride   Outcome: patient tolerated procedure well   Post-procedure details: sterile dressing applied and wound care instructions given   Dressing type: petrolatum gauze and bandage   Specimen 1 - Surgical pathology Differential Diagnosis: r/o DN  Check Margins: Yes Right Upper Arm - Anterior Skin / nail biopsy Type of biopsy: tangential   Informed consent: discussed and consent obtained   Timeout: patient name, date of birth, surgical site, and procedure verified   Procedure prep:  Patient was prepped and draped in usual sterile fashion Prep type:  Isopropyl alcohol Anesthesia: the lesion was anesthetized in a standard fashion   Anesthetic:  1% lidocaine  w/ epinephrine 1-100,000 buffered w/ 8.4% NaHCO3 Instrument used: DermaBlade   Hemostasis achieved with: aluminum chloride   Outcome: patient tolerated procedure well   Post-procedure details: sterile dressing applied and wound care instructions given   Dressing type: petrolatum gauze and bandage   Specimen 2 - Surgical pathology Differential Diagnosis: r/o DN  Check Margins: Yes Left Lower Back Skin / nail biopsy Type of biopsy: tangential   Informed consent: discussed and consent obtained   Timeout: patient name, date of birth, surgical site, and procedure verified   Procedure prep:  Patient was prepped and draped in usual sterile fashion Prep type:  Isopropyl  alcohol Anesthesia: the lesion was anesthetized in a standard fashion   Anesthetic:  1% lidocaine  w/ epinephrine 1-100,000 buffered w/ 8.4% NaHCO3 Instrument used: DermaBlade   Hemostasis achieved with: aluminum chloride   Outcome: patient tolerated procedure well   Post-procedure details: sterile dressing applied and wound care instructions given   Dressing type: petrolatum gauze and bandage   Specimen 3 - Surgical pathology Differential Diagnosis: r/o DN  Check Margins: Yes   Return in about 1 year (around 07/27/2024) for TBSE.   Documentation: I have reviewed the above documentation for accuracy and completeness, and I agree with the above.  Louana Roup, DO

## 2023-07-29 LAB — SURGICAL PATHOLOGY

## 2023-08-03 ENCOUNTER — Ambulatory Visit: Payer: Self-pay | Admitting: Dermatology

## 2023-08-03 NOTE — Progress Notes (Signed)
 Hi Shirron,  Please call pt and notify that their bx results showed 2 abnormal moles that require a full excision in office with Dr Fain Home  Diagnosis 1. Skin , R posterior upper arm--> no additional tx DYSPLASTIC COMPOUND NEVUS WITH MODERATE ATYPIA, LIMITED MARGINS FREE  2. Skin , right upper arm - anterior --> SE w/ DR P DYSPLASTIC NEVUS WITH MODERATE TO SEVERE ATYPIA, CLOSE TO MARGIN, SEE DESCRIPTION  3. Skin , left lower back --> SE w/ Dr. Hassan Links NEVUS WITH MODERATE TO SEVERE ATYPIA, INFLAMED, CLOSE TO MARGIN

## 2023-08-05 ENCOUNTER — Ambulatory Visit: Payer: Self-pay | Admitting: *Deleted

## 2023-08-05 NOTE — Telephone Encounter (Signed)
 FYI Only or Action Required?: Action required by provider  Patient was last seen in primary care on 06/23/2023 by Napolean Backbone A, DO. Called Nurse Triage reporting Abdominal Pain. Symptoms began several days ago. Interventions attempted: Rest, hydration, or home remedies. Symptoms are: unchanged.  Triage Disposition: Go to ED Now (Notify PCP)  Patient/caregiver understands and will follow disposition?: Declines ED disposition- requesting lab appointment- patient adivsed unable to schedule in office appointment due to disposition- will send message to provider Reason for Disposition  [1] SEVERE pain (e.g., excruciating) AND [2] present > 1 hour  Answer Assessment - Initial Assessment Questions 1. LOCATION: Where does it hurt?      L side, under rib- just had increased dosing of Zepbound  2. RADIATION: Does the pain shoot anywhere else? (e.g., chest, back)     No radiation- painful to touch, flex muscles 3. ONSET: When did the pain begin? (e.g., minutes, hours or days ago)      Started last week- Thursday- increased dosing - last week- mon/tues 4. SUDDEN: Gradual or sudden onset?     sudden 5. PATTERN Does the pain come and go, or is it constant?    - If it comes and goes: How long does it last? Do you have pain now?     (Note: Comes and goes means the pain is intermittent. It goes away completely between bouts.)    - If constant: Is it getting better, staying the same, or getting worse?      (Note: Constant means the pain never goes away completely; most serious pain is constant and gets worse.)      Sat was bad- decreased, then last night came back, seems to be getting worse- changes in intensity 6. SEVERITY: How bad is the pain?  (e.g., Scale 1-10; mild, moderate, or severe)    - MILD (1-3): Doesn't interfere with normal activities, abdomen soft and not tender to touch..     - MODERATE (4-7): Interferes with normal activities or awakens from sleep, abdomen tender to  touch.     - SEVERE (8-10): Excruciating pain, doubled over, unable to do any normal activities.       9/10 7. RECURRENT SYMPTOM: Have you ever had this type of stomach pain before? If Yes, ask: When was the last time? and What happened that time?      No-appendix rupture 8. AGGRAVATING FACTORS: Does anything seem to cause this pain? (e.g., foods, stress, alcohol)     Eating, alcohol 9. CARDIAC SYMPTOMS: Do you have any of the following symptoms: chest pain, difficulty breathing, sweating, nausea?     no 10. OTHER SYMPTOMS: Do you have any other symptoms? (e.g., back pain, diarrhea, fever, urination pain, vomiting)       nausea  Protocols used: Abdominal Pain - Upper-A-AH Patient declines ED disposition- wants to come to office for labs- she believes symptoms are related to increased dosing of Zepbound        Copied from CRM [id number]. Topic: Clinical - Red Word Triage >> Aug 05, 2023  9:41 AM Chuck Crater wrote: Red Word that prompted transfer to Nurse Triage: Patient is having extreme side pain underneath rib cage. She feels that it may be a side effect from Tirzepatide  tirzepatide  (ZEPBOUND ) 12.5 MG/0.5ML Pen.

## 2023-08-07 ENCOUNTER — Ambulatory Visit (INDEPENDENT_AMBULATORY_CARE_PROVIDER_SITE_OTHER): Admitting: Family Medicine

## 2023-08-07 ENCOUNTER — Encounter: Payer: Self-pay | Admitting: Family Medicine

## 2023-08-07 VITALS — BP 118/80 | HR 82 | Temp 98.1°F | Wt 155.0 lb

## 2023-08-07 DIAGNOSIS — R109 Unspecified abdominal pain: Secondary | ICD-10-CM

## 2023-08-07 NOTE — Progress Notes (Unsigned)
 Rebecca Morrison , [birth year], 53 y.o., female MRN: [id number] Patient Care Team    Relationship Specialty Notifications Start End  Mariel Shope, DO PCP - General Family Medicine  09/03/15   Hervey Loser, MD Referring Physician Gastroenterology  03/31/19   Odette Benjamin, MD Referring Physician Plastic Surgery  03/31/19   Auther Bo, RN Oncology Nurse Navigator   07/13/20   Alane Hsu, RN Oncology Nurse Navigator   07/13/20   Caralyn Chandler, MD Consulting Physician General Surgery  07/16/20   Cameron Cea, MD Consulting Physician Hematology and Oncology  07/16/20   Johna Myers, MD Consulting Physician Radiation Oncology  07/16/20   [facility] Consulting Physician Oncology  05/22/21   Diana Forster, MD Referring Physician Oncology  06/12/21   Percy Bracken, MD Consulting Physician Obstetrics and Gynecology  06/12/21   Shauna Del, DO Consulting Physician Sports Medicine  06/23/23     Chief Complaint  Patient presents with   Flank Pain    1 week, L side; pt had consumed a large some of alcohol the night before sx started. Uncomfortable to breathe, sore to the touch. Pt has not tried anything to relieve sx.      Subjective: Rebecca Morrison is a 53 y.o. Pt presents for an OV with complaints of *** of *** duration.  Associated symptoms include ***.  Pt has tried *** to ease their symptoms.      04/09/2023   10:52 AM 01/20/2023    1:16 PM 12/26/2022    9:47 AM 03/04/2022    2:40 PM 06/12/2021    8:40 AM  Depression screen PHQ 2/9  Decreased Interest 1 0 1 0 3  Down, Depressed, Hopeless 1 0 0 0 2  PHQ - 2 Score 2 0 1 0 5  Altered sleeping 3 2 3  2   Tired, decreased energy 2 1 1  3   Change in appetite 0 0 0  1  Feeling bad or failure about yourself  0 0 0  1  Trouble concentrating 3 2 2  1   Moving slowly or fidgety/restless 0 1 0  2  Suicidal thoughts 0 0 0  0  PHQ-9 Score 10 6 7  15   Difficult doing work/chores Somewhat difficult Somewhat  difficult Somewhat difficult      No Known Allergies Social History   Social History Narrative   Married, husband Publishing copy. 2 children [name] in [place].   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   Former smoker, quit 2003.   Social alcohol use, no drug use.   Drinks caffeinated beverages, uses herbal remedies, takes a daily vitamin.   Wears her seatbelt, wears a bicycle helmet, exercises at least 3 times a week.   Smoke detector in the home, feels safe in her relationships.   Past Medical History:  Diagnosis Date   Acute appendicitis 03/25/2020   Allergy    Anemia    pt states iron  deficient   Back pain 03/05/2018   Cancer (HCC)    left breast invasive ductal   Chronic pain of both knees 01/10/2019   Cyst of right breast 06/19/2014   US /mamm, confirmed cyst 9mm   Depression with anxiety 01/18/2018   Epidermal cyst 04/16/2023   Family history of breast cancer    Family history of colon cancer    Family history of kidney cancer    Family history of leukemia    Family history  of ovarian cancer    Fatty liver    Gallbladder polyp    Gallstone    GERD (gastroesophageal reflux disease)    Hiatal hernia    3/1//2016 Dr. Cari Char   Hip pain 01/12/2019   Hypertension    Internal hemorrhoids    Menorrhagia    Murmur    Patient states echocardiogram was normal   Reflux esophagitis    Tinnitus    ENT evaluated   Past Surgical History:  Procedure Laterality Date   AUGMENTATION MAMMAPLASTY  2022   done after mastectomy   BREAST REDUCTION SURGERY Bilateral 02/2019   COLONOSCOPY  07/10/2011   rectal bleeding, FHX. Normal colonoscopy.    ESOPHAGOGASTRODUODENOSCOPY  03/27/2013   hiatal hernia/GERD   LEEP     MASTECTOMY  08/2020   bilateral   ROBOTIC ASSISTED TOTAL HYSTERECTOMY  2023   Family History  Problem Relation Age of Onset   Arthritis Mother    Osteoporosis Mother        severe   Kidney cancer Sister 27   Colon cancer  Maternal Grandmother 13   Ovarian cancer Maternal Grandmother 45   Bone cancer Maternal Grandfather 57   Heart disease Maternal Grandfather    Breast cancer Paternal Grandmother        dx >50   Rheum arthritis Maternal Great-grandmother    Leukemia Maternal Great-grandfather 63   Allergies as of 08/07/2023   No Known Allergies      Medication List        Accurate as of August 07, 2023  2:32 PM. If you have any questions, ask your nurse or doctor.          CALCIUM PO Take by mouth. Stron bone   clobetasol  0.05 % external solution Commonly known as: TEMOVATE  apply to affected areas of scalp daily PRN   exemestane 25 MG tablet Commonly known as: AROMASIN Take 25 mg by mouth daily.   guanFACINE  1 MG tablet Commonly known as: TENEX  Take 0.5 tablets (0.5 mg total) by mouth at bedtime.   meloxicam  15 MG tablet Commonly known as: MOBIC  Take 1 tablet (15 mg total) by mouth daily.   mupirocin  ointment 2 % Commonly known as: BACTROBAN  Apply 1 Application topically 2 (two) times daily.   traMADol  50 MG tablet Commonly known as: ULTRAM  Take 1 tablet (50 mg total) by mouth every 12 (twelve) hours as needed.   traZODone  50 MG tablet Commonly known as: DESYREL  Take 1 tablet (50 mg total) by mouth at bedtime as needed for sleep.   Zepbound  10 MG/0.5ML Pen Generic drug: tirzepatide  Inject 10 mg into the skin once a week.   Zepbound  12.5 MG/0.5ML Pen Generic drug: tirzepatide  Inject 12.5 mg into the skin once a week.        All past medical history, surgical history, allergies, family history, immunizations andmedications were updated in the EMR today and reviewed under the history and medication portions of their EMR.     ROS Negative, with the exception of above mentioned in HPI   Objective:  BP 118/80   Pulse 82   Temp 98.1 F (36.7 C)   Wt 155 lb (70.3 kg)   LMP 06/08/2019   SpO2 98%   BMI 28.35 kg/m  Body mass index is 28.35 kg/m. Physical  Exam   No results found. No results found. No results found for this or any previous visit (from the past 24 hours).  Assessment/Plan: Rebecca Morrison is a 53 y.o. female  present for OV for  *** Reviewed expectations re: course of current medical issues. Discussed self-management of symptoms. Outlined signs and symptoms indicating need for more acute intervention. Patient verbalized understanding and all questions were answered. Patient received an After-Visit Summary.   No orders of the defined types were placed in this encounter.  No orders of the defined types were placed in this encounter.  Referral Orders  No referral(s) requested today     Note is dictated utilizing voice recognition software. Although note has been proof read prior to signing, occasional typographical errors still can be missed. If any questions arise, please do not hesitate to call for verification.   electronically signed by:  Napolean Backbone, DO  [facility] [place]

## 2023-08-08 LAB — HEPATIC FUNCTION PANEL
AG Ratio: 1.7 (calc) (ref 1.0–2.5)
ALT: 13 U/L (ref 6–29)
AST: 15 U/L (ref 10–35)
Albumin: 4.5 g/dL (ref 3.6–5.1)
Alkaline phosphatase (APISO): 63 U/L (ref 37–153)
Bilirubin, Direct: 0.2 mg/dL (ref 0.0–0.2)
Globulin: 2.6 g/dL (ref 1.9–3.7)
Indirect Bilirubin: 0.5 mg/dL (ref 0.2–1.2)
Total Bilirubin: 0.7 mg/dL (ref 0.2–1.2)
Total Protein: 7.1 g/dL (ref 6.1–8.1)

## 2023-08-08 LAB — LIPASE: Lipase: 19 U/L (ref 7–60)

## 2023-08-08 LAB — C-REACTIVE PROTEIN: CRP: <3 mg/L (ref <8.0)

## 2023-08-10 ENCOUNTER — Encounter: Payer: Self-pay | Admitting: Family Medicine

## 2023-08-11 ENCOUNTER — Ambulatory Visit: Payer: Self-pay | Admitting: Family Medicine

## 2023-08-26 ENCOUNTER — Other Ambulatory Visit (HOSPITAL_BASED_OUTPATIENT_CLINIC_OR_DEPARTMENT_OTHER): Payer: Self-pay

## 2023-08-26 MED ORDER — WEGOVY 0.5 MG/0.5ML ~~LOC~~ SOAJ
0.5000 mg | SUBCUTANEOUS | 0 refills | Status: DC
  Filled 2023-08-26: qty 2, 28d supply, fill #0

## 2023-08-27 ENCOUNTER — Other Ambulatory Visit (HOSPITAL_BASED_OUTPATIENT_CLINIC_OR_DEPARTMENT_OTHER): Payer: Self-pay

## 2023-09-04 DIAGNOSIS — Z96649 Presence of unspecified artificial hip joint: Secondary | ICD-10-CM | POA: Insufficient documentation

## 2023-09-24 ENCOUNTER — Telehealth: Payer: Self-pay

## 2023-09-24 ENCOUNTER — Other Ambulatory Visit (HOSPITAL_BASED_OUTPATIENT_CLINIC_OR_DEPARTMENT_OTHER): Payer: Self-pay

## 2023-09-24 MED ORDER — WEGOVY 0.25 MG/0.5ML ~~LOC~~ SOAJ
0.2500 mg | SUBCUTANEOUS | 1 refills | Status: DC
  Filled 2023-09-24: qty 2, 28d supply, fill #0

## 2023-09-24 NOTE — Patient Instructions (Incomplete)

## 2023-09-24 NOTE — Progress Notes (Deleted)
 Rebecca Morrison , 03/04/1970, 53 y.o., female MRN: [id number] Patient Care Team    Relationship Specialty Notifications Start End  Catherine Charlies LABOR, DO PCP - General Family Medicine  09/03/15   Amelie Eastern, MD Referring Physician Gastroenterology  03/31/19   Veria Barter, MD Referring Physician Plastic Surgery  03/31/19   Glean Stephane BROCKS, RN (Inactive) Oncology Nurse Navigator   07/13/20   Tyree Nanetta SAILOR, RN Oncology Nurse Navigator   07/13/20   Curvin Deward MOULD, MD Consulting Physician General Surgery  07/16/20   Odean Potts, MD Consulting Physician Hematology and Oncology  07/16/20   Dewey Rush, MD Consulting Physician Radiation Oncology  07/16/20   [facility] Consulting Physician Oncology  05/22/21   Rene Rocky RAMAN, MD Referring Physician Oncology  06/12/21   Georgia Blonder, MD Consulting Physician Obstetrics and Gynecology  06/12/21   Burnetta Brunet, DO Consulting Physician Sports Medicine  06/23/23     No chief complaint on file.    Subjective: Danecia Underdown is a 53 y.o. Pt presents for an OV with complaints of UTI symptoms of *** duration.  Associated symptoms include ***.  Pt has tried *** to ease their symptoms.      04/09/2023   10:52 AM 01/20/2023    1:16 PM 12/26/2022    9:47 AM 03/04/2022    2:40 PM 06/12/2021    8:40 AM  Depression screen PHQ 2/9  Decreased Interest 1 0 1 0 3  Down, Depressed, Hopeless 1 0 0 0 2  PHQ - 2 Score 2 0 1 0 5  Altered sleeping 3 2 3  2   Tired, decreased energy 2 1 1  3   Change in appetite 0 0 0  1  Feeling bad or failure about yourself  0 0 0  1  Trouble concentrating 3 2 2  1   Moving slowly or fidgety/restless 0 1 0  2  Suicidal thoughts 0 0 0  0  PHQ-9 Score 10 6 7  15   Difficult doing work/chores Somewhat difficult Somewhat difficult Somewhat difficult      No Known Allergies Social History   Social History Narrative   Married, husband Publishing copy. 2 children [name] in [place].   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   Former smoker, quit 2003.   Social alcohol use, no drug use.   Drinks caffeinated beverages, uses herbal remedies, takes a daily vitamin.   Wears her seatbelt, wears a bicycle helmet, exercises at least 3 times a week.   Smoke detector in the home, feels safe in her relationships.   Past Medical History:  Diagnosis Date   Acute appendicitis 03/25/2020   Allergy    Anemia    pt states iron  deficient   Back pain 03/05/2018   Cancer (HCC)    left breast invasive ductal   Chronic pain of both knees 01/10/2019   Cyst of right breast 06/19/2014   US /mamm, confirmed cyst 9mm   Depression with anxiety 01/18/2018   Epidermal cyst 04/16/2023   Family history of breast cancer    Family history of colon cancer    Family history of kidney cancer    Family history of leukemia    Family history of ovarian cancer    Fatty liver    Gallbladder polyp    Gallstone    GERD (gastroesophageal reflux disease)    Hiatal hernia    3/1//2016 Dr. Junior   Hip pain 01/12/2019  Hypertension    Internal hemorrhoids    Menorrhagia    Murmur    Patient states echocardiogram was normal   Reflux esophagitis    Tinnitus    ENT evaluated   Past Surgical History:  Procedure Laterality Date   AUGMENTATION MAMMAPLASTY  2022   done after mastectomy   BREAST REDUCTION SURGERY Bilateral 02/2019   COLONOSCOPY  07/10/2011   rectal bleeding, FHX. Normal colonoscopy.    ESOPHAGOGASTRODUODENOSCOPY  03/27/2013   hiatal hernia/GERD   LEEP     MASTECTOMY  08/2020   bilateral   ROBOTIC ASSISTED TOTAL HYSTERECTOMY  2023   Family History  Problem Relation Age of Onset   Arthritis Mother    Osteoporosis Mother        severe   Kidney cancer Sister 51   Colon cancer Maternal Grandmother 43   Ovarian cancer Maternal Grandmother 60   Bone cancer Maternal Grandfather 57   Heart disease Maternal Grandfather    Breast cancer Paternal  Grandmother        dx >50   Rheum arthritis Maternal Great-grandmother    Leukemia Maternal Great-grandfather 109   Allergies as of 09/25/2023   No Known Allergies      Medication List        Accurate as of September 24, 2023 12:56 PM. If you have any questions, ask your nurse or doctor.          CALCIUM PO Take by mouth. Stron bone   clobetasol  0.05 % external solution Commonly known as: TEMOVATE  apply to affected areas of scalp daily PRN   exemestane 25 MG tablet Commonly known as: AROMASIN Take 25 mg by mouth daily.   guanFACINE  1 MG tablet Commonly known as: TENEX  Take 0.5 tablets (0.5 mg total) by mouth at bedtime.   meloxicam  15 MG tablet Commonly known as: MOBIC  Take 1 tablet (15 mg total) by mouth daily.   mupirocin  ointment 2 % Commonly known as: BACTROBAN  Apply 1 Application topically 2 (two) times daily.   traMADol  50 MG tablet Commonly known as: ULTRAM  Take 1 tablet (50 mg total) by mouth every 12 (twelve) hours as needed.   traZODone  50 MG tablet Commonly known as: DESYREL  Take 1 tablet (50 mg total) by mouth at bedtime as needed for sleep.   Wegovy  0.5 MG/0.5ML Soaj Generic drug: Semaglutide -Weight Management Inject 0.5 mg into the skin once a week.   Zepbound  10 MG/0.5ML Pen Generic drug: tirzepatide  Inject 10 mg into the skin once a week.   Zepbound  12.5 MG/0.5ML Pen Generic drug: tirzepatide  Inject 12.5 mg into the skin once a week.        All past medical history, surgical history, allergies, family history, immunizations andmedications were updated in the EMR today and reviewed under the history and medication portions of their EMR.     ROS Negative, with the exception of above mentioned in HPI   Objective:  LMP 06/08/2019  There is no height or weight on file to calculate BMI.  Physical Exam   No results found. No results found. No results found for this or any previous visit (from the past 24  hours).  Assessment/Plan: Rebecca Morrison is a 53 y.o. female present for OV for  *** Reviewed expectations re: course of current medical issues. Discussed self-management of symptoms. Outlined signs and symptoms indicating need for more acute intervention. Patient verbalized understanding and all questions were answered. Patient received an After-Visit Summary.    No orders of the defined  types were placed in this encounter.  No orders of the defined types were placed in this encounter.  Referral Orders  No referral(s) requested today     Note is dictated utilizing voice recognition software. Although note has been proof read prior to signing, occasional typographical errors still can be missed. If any questions arise, please do not hesitate to call for verification.   electronically signed by:  Charlies Bellini, DO  [facility] [place]

## 2023-09-24 NOTE — Telephone Encounter (Signed)
 Communication  Reason for CRM: Patient stated she has a UTI states she needs a culture test done inquiring if an appointment needs to be made for that scheduled one just incae and added to wait list   Pt has appt scheduled.

## 2023-09-25 ENCOUNTER — Other Ambulatory Visit (HOSPITAL_BASED_OUTPATIENT_CLINIC_OR_DEPARTMENT_OTHER): Payer: Self-pay

## 2023-09-25 ENCOUNTER — Ambulatory Visit: Admitting: Family Medicine

## 2023-09-25 DIAGNOSIS — R350 Frequency of micturition: Secondary | ICD-10-CM

## 2023-10-26 HISTORY — PX: TOTAL HIP ARTHROPLASTY: SHX124

## 2023-11-11 ENCOUNTER — Ambulatory Visit: Admitting: Dermatology

## 2023-12-01 ENCOUNTER — Ambulatory Visit (INDEPENDENT_AMBULATORY_CARE_PROVIDER_SITE_OTHER): Admitting: Family Medicine

## 2023-12-01 ENCOUNTER — Encounter: Payer: Self-pay | Admitting: Family Medicine

## 2023-12-01 VITALS — BP 122/80 | HR 86 | Temp 98.2°F | Wt 174.0 lb

## 2023-12-01 DIAGNOSIS — F909 Attention-deficit hyperactivity disorder, unspecified type: Secondary | ICD-10-CM

## 2023-12-01 DIAGNOSIS — M25551 Pain in right hip: Secondary | ICD-10-CM | POA: Diagnosis not present

## 2023-12-01 DIAGNOSIS — Z23 Encounter for immunization: Secondary | ICD-10-CM

## 2023-12-01 DIAGNOSIS — G479 Sleep disorder, unspecified: Secondary | ICD-10-CM

## 2023-12-01 DIAGNOSIS — R002 Palpitations: Secondary | ICD-10-CM

## 2023-12-01 DIAGNOSIS — F419 Anxiety disorder, unspecified: Secondary | ICD-10-CM

## 2023-12-01 DIAGNOSIS — I10 Essential (primary) hypertension: Secondary | ICD-10-CM | POA: Diagnosis not present

## 2023-12-01 MED ORDER — GUANFACINE HCL 1 MG PO TABS: 0.5000 mg | ORAL_TABLET | Freq: Every day | ORAL | 1 refills | Status: AC

## 2023-12-01 MED ORDER — TRAZODONE HCL 50 MG PO TABS: 50.0000 mg | ORAL_TABLET | Freq: Every evening | ORAL | 1 refills | Status: AC | PRN

## 2023-12-01 NOTE — Progress Notes (Signed)
 Rebecca Morrison , 02-14-1971, 53 y.o., female MRN: [id number] Patient Care Team    Relationship Specialty Notifications Start End  Catherine Charlies LABOR, DO PCP - General Family Medicine  09/03/15   Amelie Eastern, MD Referring Physician Gastroenterology  03/31/19   Veria Barter, MD Referring Physician Plastic Surgery  03/31/19   Tyree Nanetta SAILOR, RN Oncology Nurse Navigator   07/13/20   Curvin Deward MOULD, MD Consulting Physician General Surgery  07/16/20   Odean Potts, MD Consulting Physician Hematology and Oncology  07/16/20   Dewey Rush, MD Consulting Physician Radiation Oncology  07/16/20   [facility] Consulting Physician Oncology  05/22/21   Rene Rocky RAMAN, MD Referring Physician Oncology  06/12/21   Georgia Blonder, MD Consulting Physician Obstetrics and Gynecology  06/12/21   Burnetta Brunet, DO Consulting Physician Sports Medicine  06/23/23     Chief Complaint  Patient presents with   Sleep Disorder     Subjective: Rebecca Morrison  is a 53 y.o. female presents to office for Chronic Conditions/illness Management Medication reconciliation completed today PMH updated where appropriate  Sleep disorder/anxiety/ADHD: Patient reports compliance with trazodone  50 mg at bedtime prn-occasionally taking.  Feeling well when she took  gaunfacine 0.5 mg at bedtime. s helped a great deal with her cravings/binging. She forgot about the medication after her surgery 1 month ago.       04/09/2023   10:52 AM 01/20/2023    1:16 PM 12/26/2022    9:47 AM 03/04/2022    2:40 PM 06/12/2021    8:40 AM  Depression screen PHQ 2/9  Decreased Interest 1 0 1 0 3  Down, Depressed, Hopeless 1 0 0 0 2  PHQ - 2 Score 2 0 1 0 5  Altered sleeping 3 2 3  2   Tired, decreased energy 2 1 1  3   Change in appetite 0 0 0  1  Feeling bad or failure about yourself  0 0 0  1  Trouble concentrating 3 2 2  1   Moving slowly or fidgety/restless 0 1 0  2  Suicidal thoughts 0 0 0  0  PHQ-9 Score 10 6 7  15    Difficult doing work/chores Somewhat difficult Somewhat difficult Somewhat difficult      No Known Allergies Social History   Social History Narrative   Married, husband Publishing copy. 2 children [name] in [place].   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   Former smoker, quit 2003.   Social alcohol use, no drug use.   Drinks caffeinated beverages, uses herbal remedies, takes a daily vitamin.   Wears her seatbelt, wears a bicycle helmet, exercises at least 3 times a week.   Smoke detector in the home, feels safe in her relationships.   Past Medical History:  Diagnosis Date   Acute appendicitis 03/25/2020   Allergy    Anemia    pt states iron  deficient   Back pain 03/05/2018   Cancer (HCC)    left breast invasive ductal   Chronic pain of both knees 01/10/2019   Cyst of right breast 06/19/2014   US /mamm, confirmed cyst 9mm   Depression with anxiety 01/18/2018   Epidermal cyst 04/16/2023   Family history of breast cancer    Family history of colon cancer    Family history of kidney cancer    Family history of leukemia    Family history of ovarian cancer    Fatty liver    Gallbladder polyp  Gallstone    GERD (gastroesophageal reflux disease)    Hiatal hernia    3/1//2016 Dr. Junior   Hip pain 01/12/2019   Hypertension    Internal hemorrhoids    Menorrhagia    Murmur    Patient states echocardiogram was normal   Reflux esophagitis    Tinnitus    ENT evaluated   Past Surgical History:  Procedure Laterality Date   AUGMENTATION MAMMAPLASTY  2022   done after mastectomy   BREAST REDUCTION SURGERY Bilateral 02/2019   COLONOSCOPY  07/10/2011   rectal bleeding, FHX. Normal colonoscopy.    ESOPHAGOGASTRODUODENOSCOPY  03/27/2013   hiatal hernia/GERD   LEEP     MASTECTOMY  08/2020   bilateral   ROBOTIC ASSISTED TOTAL HYSTERECTOMY  2023   Family History  Problem Relation Age of Onset   Arthritis Mother    Osteoporosis Mother         severe   Kidney cancer Sister 81   Colon cancer Maternal Grandmother 24   Ovarian cancer Maternal Grandmother 5   Bone cancer Maternal Grandfather 57   Heart disease Maternal Grandfather    Breast cancer Paternal Grandmother        dx >50   Rheum arthritis Maternal Great-grandmother    Leukemia Maternal Great-grandfather 81   Allergies as of 12/01/2023   No Known Allergies      Medication List        Accurate as of December 01, 2023  2:06 PM. If you have any questions, ask your nurse or doctor.          STOP taking these medications    celecoxib 100 MG capsule Commonly known as: CELEBREX Stopped by: Charlies Bellini   meloxicam  15 MG tablet Commonly known as: MOBIC  Stopped by: Charlies Bellini   methocarbamol 500 MG tablet Commonly known as: ROBAXIN Stopped by: Oney Tatlock   mupirocin  ointment 2 % Commonly known as: BACTROBAN  Stopped by: Charlies Bellini   traMADol  50 MG tablet Commonly known as: ULTRAM  Stopped by: Charlies Bellini   Wegovy  0.25 MG/0.5ML Soaj SQ injection Generic drug: semaglutide -weight management Stopped by: Charlies Bellini   Wegovy  0.5 MG/0.5ML Soaj SQ injection Generic drug: semaglutide -weight management Stopped by: Charlies Bellini   Zepbound  10 MG/0.5ML Pen Generic drug: tirzepatide  Stopped by: Charlies Bellini   Zepbound  12.5 MG/0.5ML Pen Generic drug: tirzepatide  Stopped by: Charlies Bellini       TAKE these medications    aspirin EC 81 MG tablet Take 81 mg by mouth.   CALCIUM PO Take by mouth. Stron bone   clobetasol  0.05 % external solution Commonly known as: TEMOVATE  apply to affected areas of scalp daily PRN   exemestane 25 MG tablet Commonly known as: AROMASIN Take 25 mg by mouth daily.   guanFACINE  1 MG tablet Commonly known as: TENEX  Take 0.5 tablets (0.5 mg total) by mouth at bedtime.   traZODone  50 MG tablet Commonly known as: DESYREL  Take 1 tablet (50 mg total) by mouth at bedtime as needed for sleep.         All past medical history, surgical history, allergies, family history, immunizations andmedications were updated in the EMR today and reviewed under the history and medication portions of their EMR.     ROS Negative, with the exception of above mentioned in HPI  Objective:  BP 122/80   Pulse 86   Temp 98.2 F (36.8 C)   Wt 174 lb (78.9 kg)   LMP 06/08/2019   SpO2 97%  BMI 31.83 kg/m  Body mass index is 31.83 kg/m. Physical Exam Vitals and nursing note reviewed.  Constitutional:      General: She is not in acute distress.    Appearance: Normal appearance. She is not ill-appearing, toxic-appearing or diaphoretic.  HENT:     Head: Normocephalic and atraumatic.  Eyes:     General: No scleral icterus.       Right eye: No discharge.        Left eye: No discharge.     Extraocular Movements: Extraocular movements intact.     Conjunctiva/sclera: Conjunctivae normal.     Pupils: Pupils are equal, round, and reactive to light.  Cardiovascular:     Rate and Rhythm: Normal rate and regular rhythm.  Pulmonary:     Effort: Pulmonary effort is normal. No respiratory distress.     Breath sounds: Normal breath sounds. No wheezing, rhonchi or rales.  Musculoskeletal:     Right lower leg: No edema.     Left lower leg: No edema.  Skin:    General: Skin is warm.     Findings: No rash.  Neurological:     Mental Status: She is alert and oriented to person, place, and time. Mental status is at baseline.     Motor: No weakness.     Gait: Gait normal.  Psychiatric:        Mood and Affect: Mood normal.        Behavior: Behavior normal.        Thought Content: Thought content normal.        Judgment: Judgment normal.     No results found. No results found. No results found for this or any previous visit (from the past 24 hours).  Assessment/Plan: Rebecca Morrison is a 53 y.o. female present for OV for Chronic Conditions/illness Management Sleep disorder/anxiety: Stable Continue  trazodone  50 mg nightly  Attention deficit: - continue  guanfacine  1 mg tab (1/2 tab at bedtime)   Reviewed expectations re: course of current medical issues. Discussed self-management of symptoms. Outlined signs and symptoms indicating need for more acute intervention. Patient verbalized understanding and all questions were answered. Patient received an After-Visit Summary.    No orders of the defined types were placed in this encounter.  Meds ordered this encounter  Medications   guanFACINE  (TENEX ) 1 MG tablet    Sig: Take 0.5 tablets (0.5 mg total) by mouth at bedtime.    Dispense:  45 tablet    Refill:  1   traZODone  (DESYREL ) 50 MG tablet    Sig: Take 1 tablet (50 mg total) by mouth at bedtime as needed for sleep.    Dispense:  90 tablet    Refill:  1   Referral Orders  No referral(s) requested today      Note is dictated utilizing voice recognition software. Although note has been proof read prior to signing, occasional typographical errors still can be missed. If any questions arise, please do not hesitate to call for verification.   electronically signed by:  Charlies Bellini, DO  [facility] [place]

## 2023-12-01 NOTE — Patient Instructions (Signed)

## 2023-12-15 ENCOUNTER — Other Ambulatory Visit (HOSPITAL_BASED_OUTPATIENT_CLINIC_OR_DEPARTMENT_OTHER): Payer: Self-pay

## 2023-12-15 MED ORDER — WEGOVY 0.25 MG/0.5ML ~~LOC~~ SOAJ
0.5000 mL | SUBCUTANEOUS | 0 refills | Status: AC
  Filled 2023-12-15: qty 2, 28d supply, fill #0

## 2023-12-28 ENCOUNTER — Encounter: Payer: Self-pay | Admitting: Radiology

## 2024-05-17 ENCOUNTER — Encounter: Admitting: Family Medicine

## 2024-07-28 ENCOUNTER — Ambulatory Visit: Admitting: Dermatology
